# Patient Record
Sex: Female | Born: 1960 | Marital: Single | Smoking: Never smoker
Health system: Southern US, Community
[De-identification: ages and names within clinical notes are randomized; demographics above are authoritative.]

## PROBLEM LIST (undated history)

## (undated) DIAGNOSIS — M810 Age-related osteoporosis without current pathological fracture: Secondary | ICD-10-CM

## (undated) DIAGNOSIS — Z66 Do not resuscitate: Secondary | ICD-10-CM

## (undated) DIAGNOSIS — I10 Essential (primary) hypertension: Secondary | ICD-10-CM

## (undated) DIAGNOSIS — E7800 Pure hypercholesterolemia, unspecified: Secondary | ICD-10-CM

## (undated) DIAGNOSIS — M40204 Unspecified kyphosis, thoracic region: Secondary | ICD-10-CM

## (undated) DIAGNOSIS — F79 Unspecified intellectual disabilities: Secondary | ICD-10-CM

## (undated) HISTORY — DX: Unspecified intellectual disabilities: F79

## (undated) HISTORY — DX: Essential (primary) hypertension: I10

## (undated) HISTORY — DX: Unspecified kyphosis, thoracic region: M40.204

## (undated) HISTORY — DX: Age-related osteoporosis without current pathological fracture: M81.0

## (undated) HISTORY — DX: Do not resuscitate: Z66

## (undated) HISTORY — PX: ABDOMINAL HYSTERECTOMY: SHX81

---

## 2005-07-10 LAB — HM MAMMOGRAPHY

## 2014-09-17 DIAGNOSIS — M8588 Other specified disorders of bone density and structure, other site: Secondary | ICD-10-CM | POA: Diagnosis not present

## 2014-09-17 DIAGNOSIS — E2839 Other primary ovarian failure: Secondary | ICD-10-CM | POA: Diagnosis not present

## 2014-09-17 DIAGNOSIS — M810 Age-related osteoporosis without current pathological fracture: Secondary | ICD-10-CM | POA: Diagnosis not present

## 2014-09-17 LAB — HM DEXA SCAN

## 2014-10-22 DIAGNOSIS — M810 Age-related osteoporosis without current pathological fracture: Secondary | ICD-10-CM | POA: Diagnosis not present

## 2015-06-15 ENCOUNTER — Other Ambulatory Visit: Payer: Self-pay | Admitting: Family Medicine

## 2015-06-15 DIAGNOSIS — Z1231 Encounter for screening mammogram for malignant neoplasm of breast: Secondary | ICD-10-CM

## 2015-06-21 ENCOUNTER — Ambulatory Visit
Admission: RE | Admit: 2015-06-21 | Discharge: 2015-06-21 | Disposition: A | Discharge status: Home / Self Care | Payer: Medicare Other | Source: Ambulatory Visit | Attending: Family Medicine | Admitting: Family Medicine

## 2015-06-21 DIAGNOSIS — Z1231 Encounter for screening mammogram for malignant neoplasm of breast: Secondary | ICD-10-CM | POA: Insufficient documentation

## 2015-08-02 ENCOUNTER — Ambulatory Visit (INDEPENDENT_AMBULATORY_CARE_PROVIDER_SITE_OTHER): Payer: Medicare Other

## 2015-08-02 DIAGNOSIS — Z23 Encounter for immunization: Secondary | ICD-10-CM | POA: Diagnosis not present

## 2015-08-12 DIAGNOSIS — M810 Age-related osteoporosis without current pathological fracture: Secondary | ICD-10-CM | POA: Insufficient documentation

## 2015-08-12 DIAGNOSIS — M40204 Unspecified kyphosis, thoracic region: Secondary | ICD-10-CM | POA: Insufficient documentation

## 2015-08-12 DIAGNOSIS — I10 Essential (primary) hypertension: Secondary | ICD-10-CM | POA: Insufficient documentation

## 2015-08-12 DIAGNOSIS — F79 Unspecified intellectual disabilities: Secondary | ICD-10-CM | POA: Insufficient documentation

## 2015-08-30 ENCOUNTER — Ambulatory Visit (INDEPENDENT_AMBULATORY_CARE_PROVIDER_SITE_OTHER): Payer: Medicare Other | Admitting: Family Medicine

## 2015-08-30 ENCOUNTER — Encounter: Payer: Self-pay | Admitting: Family Medicine

## 2015-08-30 VITALS — BP 140/100 | HR 100 | Temp 97.9°F | Ht 61.0 in | Wt 159.0 lb

## 2015-08-30 DIAGNOSIS — Z1211 Encounter for screening for malignant neoplasm of colon: Secondary | ICD-10-CM | POA: Diagnosis not present

## 2015-08-30 DIAGNOSIS — M810 Age-related osteoporosis without current pathological fracture: Secondary | ICD-10-CM | POA: Diagnosis not present

## 2015-08-30 DIAGNOSIS — I10 Essential (primary) hypertension: Secondary | ICD-10-CM

## 2015-08-30 DIAGNOSIS — Z0000 Encounter for general adult medical examination without abnormal findings: Secondary | ICD-10-CM | POA: Diagnosis not present

## 2015-08-30 MED ORDER — AMLODIPINE BESYLATE 5 MG PO TABS: 5.0000 mg | ORAL_TABLET | Freq: Every day | ORAL | Status: DC

## 2015-08-30 NOTE — Patient Instructions (Addendum)
Please have her get a bone density scan every two years Three servings of calcium OR calcium supplement daily We'll get labs today Give her 1000 iu of vitamin D3 daily Your goal blood pressure is less than 140 mmHg on top. Start amlodipine 5 mg daily Try to follow the DASH guidelines (DASH stands for Dietary Approaches to Stop Hypertension) Try to limit the sodium in your diet.  Ideally, consume less than 1.5 grams (less than 1,'500mg'$ ) per day. Do not add salt when cooking or at the table.  Check the sodium amount on labels when shopping, and choose items lower in sodium when given a choice. Avoid or limit foods that already contain a lot of sodium. Eat a diet rich in fruits and vegetables and whole grains.   Health Maintenance, Female Adopting a healthy lifestyle and getting preventive care can go a long way to promote health and wellness. Talk with your health care provider about what schedule of regular examinations is right for you. This is a good chance for you to check in with your provider about disease prevention and staying healthy. In between checkups, there are plenty of things you can do on your own. Experts have done a lot of research about which lifestyle changes and preventive measures are most likely to keep you healthy. Ask your health care provider for more information. WEIGHT AND DIET  Eat a healthy diet  Be sure to include plenty of vegetables, fruits, low-fat dairy products, and lean protein.  Do not eat a lot of foods high in solid fats, added sugars, or salt.  Get regular exercise. This is one of the most important things you can do for your health.  Most adults should exercise for at least 150 minutes each week. The exercise should increase your heart rate and make you sweat (moderate-intensity exercise).  Most adults should also do strengthening exercises at least twice a week. This is in addition to the moderate-intensity exercise.  Maintain a healthy  weight  Body mass index (BMI) is a measurement that can be used to identify possible weight problems. It estimates body fat based on height and weight. Your health care provider can help determine your BMI and help you achieve or maintain a healthy weight.  For females 25 years of age and older:   A BMI below 18.5 is considered underweight.  A BMI of 18.5 to 24.9 is normal.  A BMI of 25 to 29.9 is considered overweight.  A BMI of 30 and above is considered obese.  Watch levels of cholesterol and blood lipids  You should start having your blood tested for lipids and cholesterol at 54 years of age, then have this test every 5 years.  You may need to have your cholesterol levels checked more often if:  Your lipid or cholesterol levels are high.  You are older than 54 years of age.  You are at high risk for heart disease.  CANCER SCREENING   Lung Cancer  Lung cancer screening is recommended for adults 26-21 years old who are at high risk for lung cancer because of a history of smoking.  A yearly low-dose CT scan of the lungs is recommended for people who:  Currently smoke.  Have quit within the past 15 years.  Have at least a 30-pack-year history of smoking. A pack year is smoking an average of one pack of cigarettes a day for 1 year.  Yearly screening should continue until it has been 15 years since you  quit.  Yearly screening should stop if you develop a health problem that would prevent you from having lung cancer treatment.  Breast Cancer  Practice breast self-awareness. This means understanding how your breasts normally appear and feel.  It also means doing regular breast self-exams. Let your health care provider know about any changes, no matter how small.  If you are in your 20s or 30s, you should have a clinical breast exam (CBE) by a health care provider every 1-3 years as part of a regular health exam.  If you are 38 or older, have a CBE every year. Also  consider having a breast X-ray (mammogram) every year.  If you have a family history of breast cancer, talk to your health care provider about genetic screening.  If you are at high risk for breast cancer, talk to your health care provider about having an MRI and a mammogram every year.  Breast cancer gene (BRCA) assessment is recommended for women who have family members with BRCA-related cancers. BRCA-related cancers include:  Breast.  Ovarian.  Tubal.  Peritoneal cancers.  Results of the assessment will determine the need for genetic counseling and BRCA1 and BRCA2 testing. Cervical Cancer Your health care provider may recommend that you be screened regularly for cancer of the pelvic organs (ovaries, uterus, and vagina). This screening involves a pelvic examination, including checking for microscopic changes to the surface of your cervix (Pap test). You may be encouraged to have this screening done every 3 years, beginning at age 41.  For women ages 67-65, health care providers may recommend pelvic exams and Pap testing every 3 years, or they may recommend the Pap and pelvic exam, combined with testing for human papilloma virus (HPV), every 5 years. Some types of HPV increase your risk of cervical cancer. Testing for HPV may also be done on women of any age with unclear Pap test results.  Other health care providers may not recommend any screening for nonpregnant women who are considered low risk for pelvic cancer and who do not have symptoms. Ask your health care provider if a screening pelvic exam is right for you.  If you have had past treatment for cervical cancer or a condition that could lead to cancer, you need Pap tests and screening for cancer for at least 20 years after your treatment. If Pap tests have been discontinued, your risk factors (such as having a new sexual partner) need to be reassessed to determine if screening should resume. Some women have medical problems that  increase the chance of getting cervical cancer. In these cases, your health care provider may recommend more frequent screening and Pap tests. Colorectal Cancer  This type of cancer can be detected and often prevented.  Routine colorectal cancer screening usually begins at 54 years of age and continues through 54 years of age.  Your health care provider may recommend screening at an earlier age if you have risk factors for colon cancer.  Your health care provider may also recommend using home test kits to check for hidden blood in the stool.  A small camera at the end of a tube can be used to examine your colon directly (sigmoidoscopy or colonoscopy). This is done to check for the earliest forms of colorectal cancer.  Routine screening usually begins at age 70.  Direct examination of the colon should be repeated every 5-10 years through 54 years of age. However, you may need to be screened more often if early forms of precancerous  polyps or small growths are found. Skin Cancer  Check your skin from head to toe regularly.  Tell your health care provider about any new moles or changes in moles, especially if there is a change in a mole's shape or color.  Also tell your health care provider if you have a mole that is larger than the size of a pencil eraser.  Always use sunscreen. Apply sunscreen liberally and repeatedly throughout the day.  Protect yourself by wearing long sleeves, pants, a wide-brimmed hat, and sunglasses whenever you are outside. HEART DISEASE, DIABETES, AND HIGH BLOOD PRESSURE   High blood pressure causes heart disease and increases the risk of stroke. High blood pressure is more likely to develop in:  People who have blood pressure in the high end of the normal range (130-139/85-89 mm Hg).  People who are overweight or obese.  People who are African American.  If you are 74-75 years of age, have your blood pressure checked every 3-5 years. If you are 11 years of  age or older, have your blood pressure checked every year. You should have your blood pressure measured twice--once when you are at a hospital or clinic, and once when you are not at a hospital or clinic. Record the average of the two measurements. To check your blood pressure when you are not at a hospital or clinic, you can use:  An automated blood pressure machine at a pharmacy.  A home blood pressure monitor.  If you are between 73 years and 49 years old, ask your health care provider if you should take aspirin to prevent strokes.  Have regular diabetes screenings. This involves taking a blood sample to check your fasting blood sugar level.  If you are at a normal weight and have a low risk for diabetes, have this test once every three years after 54 years of age.  If you are overweight and have a high risk for diabetes, consider being tested at a younger age or more often. PREVENTING INFECTION  Hepatitis B  If you have a higher risk for hepatitis B, you should be screened for this virus. You are considered at high risk for hepatitis B if:  You were born in a country where hepatitis B is common. Ask your health care provider which countries are considered high risk.  Your parents were born in a high-risk country, and you have not been immunized against hepatitis B (hepatitis B vaccine).  You have HIV or AIDS.  You use needles to inject street drugs.  You live with someone who has hepatitis B.  You have had sex with someone who has hepatitis B.  You get hemodialysis treatment.  You take certain medicines for conditions, including cancer, organ transplantation, and autoimmune conditions. Hepatitis C  Blood testing is recommended for:  Everyone born from [birth year] through 1965.  Anyone with known risk factors for hepatitis C. Sexually transmitted infections (STIs)  You should be screened for sexually transmitted infections (STIs) including gonorrhea and chlamydia if:  You are  sexually active and are younger than 54 years of age.  You are older than 54 years of age and your health care provider tells you that you are at risk for this type of infection.  Your sexual activity has changed since you were last screened and you are at an increased risk for chlamydia or gonorrhea. Ask your health care provider if you are at risk.  If you do not have HIV, but are at risk, it  may be recommended that you take a prescription medicine daily to prevent HIV infection. This is called pre-exposure prophylaxis (PrEP). You are considered at risk if:  You are sexually active and do not regularly use condoms or know the HIV status of your partner(s).  You take drugs by injection.  You are sexually active with a partner who has HIV. Talk with your health care provider about whether you are at high risk of being infected with HIV. If you choose to begin PrEP, you should first be tested for HIV. You should then be tested every 3 months for as long as you are taking PrEP.  PREGNANCY   If you are premenopausal and you may become pregnant, ask your health care provider about preconception counseling.  If you may become pregnant, take 400 to 800 micrograms (mcg) of folic acid every day.  If you want to prevent pregnancy, talk to your health care provider about birth control (contraception). OSTEOPOROSIS AND MENOPAUSE   Osteoporosis is a disease in which the bones lose minerals and strength with aging. This can result in serious bone fractures. Your risk for osteoporosis can be identified using a bone density scan.  If you are 44 years of age or older, or if you are at risk for osteoporosis and fractures, ask your health care provider if you should be screened.  Ask your health care provider whether you should take a calcium or vitamin D supplement to lower your risk for osteoporosis.  Menopause may have certain physical symptoms and risks.  Hormone replacement therapy may reduce some  of these symptoms and risks. Talk to your health care provider about whether hormone replacement therapy is right for you.  HOME CARE INSTRUCTIONS   Schedule regular health, dental, and eye exams.  Stay current with your immunizations.   Do not use any tobacco products including cigarettes, chewing tobacco, or electronic cigarettes.  If you are pregnant, do not drink alcohol.  If you are breastfeeding, limit how much and how often you drink alcohol.  Limit alcohol intake to no more than 1 drink per day for nonpregnant women. One drink equals 12 ounces of beer, 5 ounces of wine, or 1 ounces of hard liquor.  Do not use street drugs.  Do not share needles.  Ask your health care provider for help if you need support or information about quitting drugs.  Tell your health care provider if you often feel depressed.  Tell your health care provider if you have ever been abused or do not feel safe at home.   This information is not intended to replace advice given to you by your health care provider. Make sure you discuss any questions you have with your health care provider.   Document Released: 03/13/2011 Document Revised: 09/18/2014 Document Reviewed: 07/30/2013 Elsevier Interactive Patient Education 2016 [employer] DASH stands for "Dietary Approaches to Stop Hypertension." The DASH eating plan is a healthy eating plan that has been shown to reduce high blood pressure (hypertension). Additional health benefits may include reducing the risk of type 2 diabetes mellitus, heart disease, and stroke. The DASH eating plan may also help with weight loss. WHAT DO I NEED TO KNOW ABOUT THE DASH EATING PLAN? For the DASH eating plan, you will follow these general guidelines:  Choose foods with a percent daily value for sodium of less than 5% (as listed on the food label).  Use salt-free seasonings or herbs instead of table salt or sea salt.  Check with your health care  provider or pharmacist before using salt substitutes.  Eat lower-sodium products, often labeled as "lower sodium" or "no salt added."  Eat fresh foods.  Eat more vegetables, fruits, and low-fat dairy products.  Choose whole grains. Look for the word "whole" as the first word in the ingredient list.  Choose fish and skinless chicken or [place] more often than red meat. Limit fish, poultry, and meat to 6 oz (170 g) each day.  Limit sweets, desserts, sugars, and sugary drinks.  Choose heart-healthy fats.  Limit cheese to 1 oz (28 g) per day.  Eat more home-cooked food and less restaurant, buffet, and fast food.  Limit fried foods.  Cook foods using methods other than frying.  Limit canned vegetables. If you do use them, rinse them well to decrease the sodium.  When eating at a restaurant, ask that your food be prepared with less salt, or no salt if possible. WHAT FOODS CAN I EAT? Seek help from a dietitian for individual calorie needs. Grains Whole grain or whole wheat bread. Brown rice. Whole grain or whole wheat pasta. Quinoa, bulgur, and whole grain cereals. Low-sodium cereals. Corn or whole wheat flour tortillas. Whole grain cornbread. Whole grain crackers. Low-sodium crackers. Vegetables Fresh or frozen vegetables (raw, steamed, roasted, or grilled). Low-sodium or reduced-sodium tomato and vegetable juices. Low-sodium or reduced-sodium tomato sauce and paste. Low-sodium or reduced-sodium canned vegetables.  Fruits All fresh, canned (in natural juice), or frozen fruits. Meat and Other Protein Products Ground beef (85% or leaner), grass-fed beef, or beef trimmed of fat. Skinless chicken or [place]. Ground chicken or [place]. Pork trimmed of fat. All fish and seafood. Eggs. Dried beans, peas, or lentils. Unsalted nuts and seeds. Unsalted canned beans. Dairy Low-fat dairy products, such as skim or 1% milk, 2% or reduced-fat cheeses, low-fat ricotta or cottage cheese, or plain low-fat  yogurt. Low-sodium or reduced-sodium cheeses. Fats and Oils Tub margarines without trans fats. Light or reduced-fat mayonnaise and salad dressings (reduced sodium). Avocado. Safflower, olive, or canola oils. Natural peanut or almond butter. Other Unsalted popcorn and pretzels. The items listed above may not be a complete list of recommended foods or beverages. Contact your dietitian for more options. WHAT FOODS ARE NOT RECOMMENDED? Grains White bread. White pasta. White rice. Refined cornbread. Bagels and croissants. Crackers that contain trans fat. Vegetables Creamed or fried vegetables. Vegetables in a cheese sauce. Regular canned vegetables. Regular canned tomato sauce and paste. Regular tomato and vegetable juices. Fruits Dried fruits. Canned fruit in light or heavy syrup. Fruit juice. Meat and Other Protein Products Fatty cuts of meat. Ribs, chicken wings, bacon, sausage, bologna, salami, chitterlings, fatback, hot dogs, bratwurst, and packaged luncheon meats. Salted nuts and seeds. Canned beans with salt. Dairy Whole or 2% milk, cream, half-and-half, and cream cheese. Whole-fat or sweetened yogurt. Full-fat cheeses or blue cheese. Nondairy creamers and whipped toppings. Processed cheese, cheese spreads, or cheese curds. Condiments Onion and garlic salt, seasoned salt, table salt, and sea salt. Canned and packaged gravies. Worcestershire sauce. Tartar sauce. Barbecue sauce. Teriyaki sauce. Soy sauce, including reduced sodium. Steak sauce. Fish sauce. Oyster sauce. Cocktail sauce. Horseradish. Ketchup and mustard. Meat flavorings and tenderizers. Bouillon cubes. Hot sauce. Tabasco sauce. Marinades. Taco seasonings. Relishes. Fats and Oils Butter, stick margarine, lard, shortening, ghee, and bacon fat. Coconut, palm kernel, or palm oils. Regular salad dressings. Other Pickles and olives. Salted popcorn and pretzels. The items listed above may not be a complete list of foods  and beverages to  avoid. Contact your dietitian for more information. WHERE CAN I FIND MORE INFORMATION? National Heart, Lung, and Blood Institute: travelstabloid.com   This information is not intended to replace advice given to you by your health care provider. Make sure you discuss any questions you have with your health care provider.   Document Released: 08/17/2011 Document Revised: 09/18/2014 Document Reviewed: 07/02/2013 Elsevier Interactive Patient Education [employer].

## 2015-08-30 NOTE — Assessment & Plan Note (Signed)
DEXA every two years; check vit D; three servings of calcium a day, 1000 iu vit D daily

## 2015-08-30 NOTE — Progress Notes (Addendum)
Patient: Tonya Lawson, Female    DOB: 1961/06/20, 54 y.o.   MRN: [id number]  Visit Date: 09/04/2015  Today's Provider: Enid Derry, MD   Chief Complaint  Patient presents with  . Annual Exam    Subjective:   Tonya Lawson is a 54 y.o. female who presents today for her Subsequent Annual Wellness Visit.  Caregiver input: mother here with patient    USPSTF grade A and B recommendations Alcohol: none Depression: no Hypertension: high today, excited mother says Obesity:  Tobacco use: none HIV, hep B, hep C: declined by mother STD testing and prevention (chl/gon/syphilis): declined by mother Lipids: check today Glucose: check tdoay Colorectal cancer: Cologuard Breast cancer: mammo UTD BRCA gene screening: maternal aunt had breast cancer Intimate partner violence: no Cervical cancer screening: s/p hyst Lung cancer: n/a Osteoporosis: n/a Fall prevention/vitamin D: discussed, 800 iu vit D AAA: n/a Aspirin: n/a Diet: healthy eater mom says Exercise: walks a lot, never sits down, hardly ever, rakes leaves, raises a garden Skin cancer: no worrisome moles  HPI  Review of Systems  Constitutional: Negative for fever.  HENT: Positive for rhinorrhea. Negative for hearing loss, mouth sores, nosebleeds and sore throat.   Eyes: Negative for visual disturbance.  Respiratory: Negative for wheezing.   Cardiovascular: Negative for chest pain.  Gastrointestinal: Negative for abdominal pain.  Genitourinary: Negative for dysuria.  Musculoskeletal: Negative for joint swelling.  Skin:       No worrisome skin changes  Allergic/Immunologic: Negative for food allergies.  Neurological: Negative for tremors.  Hematological: Negative for adenopathy.  Psychiatric/Behavioral: Negative for dysphoric mood. The patient is not nervous/anxious.    Past Medical History  Diagnosis Date  . Hypertension   . Osteoporosis   . Mental retardation   . Kyphosis of thoracic region    Past Surgical  History  Procedure Laterality Date  . Abdominal hysterectomy      completed   Family History  Problem Relation Age of Onset  . Breast cancer Maternal Aunt 70  . Diabetes Mother   . Cancer Father     brain  . Cancer Brother   . Stroke Mother    Social History   Social History  . Marital Status: Unknown    Spouse Name: N/A  . Number of Children: N/A  . Years of Education: N/A   Occupational History  . Not on file.   Social History Main Topics  . Smoking status: Never Smoker   . Smokeless tobacco: Never Used  . Alcohol Use: No  . Drug Use: No  . Sexual Activity: Not on file   Other Topics Concern  . Not on file   Social History Narrative    Outpatient Encounter Prescriptions as of 08/30/2015  Medication Sig  . alendronate (FOSAMAX) 70 MG tablet Take 70 mg by mouth once a week. Take with a full glass of water on an empty stomach.  . [DISCONTINUED] amLODipine (NORVASC) 5 MG tablet Take 1 tablet (5 mg total) by mouth daily.   No facility-administered encounter medications on file as of 08/30/2015.   Functional Ability / Safety Screening 1.  Was the timed Get Up and Go test longer than 30 seconds?  no 2.  Does the patient need help with the phone, transportation, shopping,      preparing meals, housework, laundry, medications, or managing money?  yes , can make a sandwich; needs help with everything else 3.  Does the patient's home have:  loose throw rugs in the  hallway?   no      Grab bars in the bathroom? yes      Handrails on the stairs?   yes      Poor lighting?   no 4.  Has the patient noticed any hearing difficulties?   no  Fall Risk Assessment See under rooming  Depression Screen See under rooming Depression screen [facility] 2/9 08/30/2015  Decreased Interest 0  Down, Depressed, Hopeless 0  PHQ - 2 Score 0   Advanced Directives Does patient have a HCPOA?    yes If yes, name and contact information: Albina Billet [phone] Mother is going to have  it lined up that if anything happens to mother or father, patient can go to  [place] Does patient have a living will or MOST form?  no  Objective:   Vitals: BP 140/100 mmHg  Pulse 100  Temp(Src) 97.9 F (36.6 C)  Ht '5\' 1"'$  (1.549 m)  Wt 159 lb (72.122 kg)  BMI 30.06 kg/m2  SpO2 100% Body mass index is 30.06 kg/(m^2). No exam data present BP up because she is excited about brining candy Today's Vitals   08/30/15 0806 08/30/15 0928  BP: 149/94 140/100  Pulse: 100   Temp: 97.9 F (36.6 C)   Height: '5\' 1"'$  (1.549 m)   Weight: 159 lb (72.122 kg)   SpO2: 100%   recheck BP 140/100  Physical Exam  Constitutional: She appears well-developed and well-nourished.  HENT:  Head: Normocephalic and atraumatic.  Right Ear: Hearing, tympanic membrane, external ear and ear canal normal.  Left Ear: Hearing, tympanic membrane, external ear and ear canal normal.  Eyes: Conjunctivae and EOM are normal. Right eye exhibits no hordeolum. Left eye exhibits no hordeolum. No scleral icterus.  Neck: Carotid bruit is not present. No thyromegaly present.  Cardiovascular: Normal rate, regular rhythm, S1 normal, S2 normal and normal heart sounds.   No extrasystoles are present.  Pulmonary/Chest: Effort normal and breath sounds normal. No respiratory distress.  Mother declined breast exam for patient  Abdominal: Soft. Normal appearance and bowel sounds are normal. She exhibits no distension, no abdominal bruit, no pulsatile midline mass and no mass. There is no hepatosplenomegaly. There is no tenderness. No hernia.  Musculoskeletal: Normal range of motion. She exhibits no edema.  Lymphadenopathy:       Head (right side): No submandibular adenopathy present.       Head (left side): No submandibular adenopathy present.    She has no cervical adenopathy.    She has no axillary adenopathy.  Neurological: She is alert. She displays no tremor. No cranial nerve deficit. She exhibits normal muscle tone. Gait  normal.  Reflex Scores:      Patellar reflexes are 2+ on the right side and 2+ on the left side. Skin: Skin is warm and dry. No bruising and no ecchymosis noted. No cyanosis. No pallor.  Psychiatric: Her speech is normal and behavior is normal. Thought content normal. Her mood appears not anxious. She does not exhibit a depressed mood.   Mood/affect:  Euthymic, pleasant Appearance:  Casually dressed in [place] t-shirt  Cognitive Testing - 6-CIT  Correct? Score   What year is it? no 4 Yes = 0    No = 4  What month is it? no 3 Yes = 0    No = 3  Remember:     [place], [address] [place], [place]     What time is it? no 3 Yes =  0    No = 3  Count backwards from 20 to 1 no 4 Correct = 0    1 error = 2   More than 1 error = 4  Say the months of the year in reverse. no 4 Correct = 0    1 error = 2   More than 1 error = 4  What address did I ask you to remember? no 10 Correct = 0  1 error = 2    2 error = 4    3 error = 6    4 error = 8    All wrong = 10       TOTAL SCORE  28/28   Interpretation:  Abnormal- limited mentation  Normal (0-7) Abnormal (8-28)    Assessment & Plan:     Annual Wellness Visit  Reviewed patient's Family Medical History Reviewed and updated list of patient's medical providers Assessment of cognitive impairment was done Assessed patient's functional ability Established a written schedule for health screening [place] Completed and Reviewed  Immunization History  Administered Date(s) Administered  . Influenza,inj,Quad PF,36+ Mos 08/02/2015  . Pneumococcal Polysaccharide-23 06/23/2000, 07/24/2005  . Td 06/20/2005  . Zoster 09/17/2013    Health Maintenance  Topic Date Due  . COLONOSCOPY  08/29/2016 (Originally 06/09/2011)  . TETANUS/TDAP  08/29/2016 (Originally 06/21/2015)  . Hepatitis C Screening  08/29/2016 (Originally [birth year]/1960)  . HIV Screening  08/29/2016 (Originally 06/08/1976)  . PAP SMEAR  08/29/2018 (Originally 06/08/1982)  .  INFLUENZA VACCINE  04/11/2016  . MAMMOGRAM  06/20/2017   Discussed health benefits of physical activity, and encouraged her to engage in regular exercise appropriate for her age and condition.    Current outpatient prescriptions:  .  alendronate (FOSAMAX) 70 MG tablet, Take 70 mg by mouth once a week. Take with a full glass of water on an empty stomach., Disp: , Rfl:  There are no discontinued medications.  Next Medicare Wellness Visit in 12+ months  Problem List Items Addressed This Visit      Cardiovascular and Mediastinum   Hypertension    BP elevated today, even on recheck; will start low-dose antihypertensive; mother will check at home; DASH guidelines        Musculoskeletal and Integument   Osteoporosis    DEXA every two years; check vit D; three servings of calcium a day, 1000 iu vit D daily      Relevant Orders   VITAMIN D 25 Hydroxy (Vit-D Deficiency, Fractures) (Completed)   DG Bone Density     Other   Preventative health care - Primary    USPSTF grade A and B recommendations reviewed with patient; age-appropriate recommendations, preventive care, screening tests, etc discussed and encouraged; healthy living encouraged; see AVS for patient education given to patient      Relevant Orders   CBC with Differential/Platelet (Completed)   Comprehensive metabolic panel (Completed)   Lipid Panel w/o Chol/HDL Ratio (Completed)    Other Visit Diagnoses    Colon cancer screening        Relevant Orders    Cologuard      An after-visit summary was printed and given to the patient at [place].  Please see the patient instructions which may contain other information and recommendations beyond what is mentioned above in the assessment and plan.  Orders Placed This Encounter  Procedures  . DG Bone Density  . CBC with Differential/Platelet  . Comprehensive metabolic panel  . Lipid Panel w/o  Chol/HDL Ratio  . VITAMIN D 25 Hydroxy (Vit-D Deficiency, Fractures)  .  Cologuard

## 2015-08-31 ENCOUNTER — Telehealth: Payer: Self-pay | Admitting: Family Medicine

## 2015-08-31 LAB — CBC WITH DIFFERENTIAL/PLATELET
BASOS ABS: 0.1 10*3/uL (ref 0.0–0.2)
Basos: 1 %
EOS (ABSOLUTE): 0.3 10*3/uL (ref 0.0–0.4)
Eos: 4 %
Hematocrit: 41.9 % (ref 34.0–46.6)
Hemoglobin: 13.9 g/dL (ref 11.1–15.9)
IMMATURE GRANS (ABS): 0 10*3/uL (ref 0.0–0.1)
IMMATURE GRANULOCYTES: 0 %
LYMPHS: 38 %
Lymphocytes Absolute: 3.3 10*3/uL — ABNORMAL HIGH (ref 0.7–3.1)
MCH: 28.5 pg (ref 26.6–33.0)
MCHC: 33.2 g/dL (ref 31.5–35.7)
MCV: 86 fL (ref 79–97)
MONOCYTES: 7 %
Monocytes Absolute: 0.6 10*3/uL (ref 0.1–0.9)
NEUTROS PCT: 50 %
Neutrophils Absolute: 4.3 10*3/uL (ref 1.4–7.0)
PLATELETS: 389 10*3/uL — AB (ref 150–379)
RBC: 4.88 x10E6/uL (ref 3.77–5.28)
RDW: 14.5 % (ref 12.3–15.4)
WBC: 8.7 10*3/uL (ref 3.4–10.8)

## 2015-08-31 LAB — COMPREHENSIVE METABOLIC PANEL
ALT: 9 IU/L (ref 0–32)
AST: 11 IU/L (ref 0–40)
Albumin/Globulin Ratio: 1.7 (ref 1.1–2.5)
Albumin: 4 g/dL (ref 3.5–5.5)
Alkaline Phosphatase: 100 IU/L (ref 39–117)
BUN/Creatinine Ratio: 18 (ref 9–23)
BUN: 14 mg/dL (ref 6–24)
CALCIUM: 9.2 mg/dL (ref 8.7–10.2)
CHLORIDE: 104 mmol/L (ref 96–106)
CO2: 24 mmol/L (ref 18–29)
CREATININE: 0.76 mg/dL (ref 0.57–1.00)
GFR, EST AFRICAN AMERICAN: 103 mL/min/{1.73_m2} (ref >59)
GFR, EST NON AFRICAN AMERICAN: 89 mL/min/{1.73_m2} (ref >59)
GLUCOSE: 99 mg/dL (ref 65–99)
Globulin, Total: 2.3 g/dL (ref 1.5–4.5)
Potassium: 4.4 mmol/L (ref 3.5–5.2)
Sodium: 142 mmol/L (ref 134–144)
TOTAL PROTEIN: 6.3 g/dL (ref 6.0–8.5)

## 2015-08-31 LAB — LIPID PANEL W/O CHOL/HDL RATIO
Cholesterol, Total: 210 mg/dL — ABNORMAL HIGH (ref 100–199)
HDL: 51 mg/dL (ref >39)
LDL Calculated: 134 mg/dL — ABNORMAL HIGH (ref 0–99)
Triglycerides: 127 mg/dL (ref 0–149)
VLDL CHOLESTEROL CAL: 25 mg/dL (ref 5–40)

## 2015-08-31 LAB — VITAMIN D 25 HYDROXY (VIT D DEFICIENCY, FRACTURES): Vit D, 25-Hydroxy: 33 ng/mL (ref 30.0–100.0)

## 2015-08-31 NOTE — Telephone Encounter (Signed)
pts mom called stated that the pts bp was down when she got home so she wasn't sure if she needed to give her the bp medication.

## 2015-08-31 NOTE — Telephone Encounter (Signed)
BP was 110/60 when she was at home She will check her BP daily and let us know if trending up; I explained lab results

## 2015-09-04 NOTE — Addendum Note (Signed)
Addended by: LADA, Satira Anis on: 09/04/2015 01:01 PM   Modules accepted: Orders, SmartSet

## 2015-09-04 NOTE — Assessment & Plan Note (Signed)
USPSTF grade A and B recommendations reviewed with patient; age-appropriate recommendations, preventive care, screening tests, etc discussed and encouraged; healthy living encouraged; see AVS for patient education given to patient  

## 2015-09-04 NOTE — Assessment & Plan Note (Signed)
BP elevated today, even on recheck; will start low-dose antihypertensive; mother will check at home; DASH guidelines

## 2015-09-28 ENCOUNTER — Ambulatory Visit: Payer: Medicare Other | Admitting: Family Medicine

## 2015-10-07 ENCOUNTER — Encounter: Payer: Self-pay | Admitting: Family Medicine

## 2015-10-14 ENCOUNTER — Telehealth: Payer: Self-pay | Admitting: Family Medicine

## 2015-10-14 ENCOUNTER — Ambulatory Visit
Admission: RE | Admit: 2015-10-14 | Discharge: 2015-10-14 | Disposition: A | Discharge status: Home / Self Care | Payer: Medicare Other | Source: Ambulatory Visit | Attending: Family Medicine | Admitting: Family Medicine

## 2015-10-14 DIAGNOSIS — Z13820 Encounter for screening for osteoporosis: Secondary | ICD-10-CM | POA: Diagnosis not present

## 2015-10-14 DIAGNOSIS — M810 Age-related osteoporosis without current pathological fracture: Secondary | ICD-10-CM | POA: Diagnosis not present

## 2015-10-14 NOTE — Telephone Encounter (Signed)
Osteoporosis

## 2015-10-15 MED ORDER — ALENDRONATE SODIUM 70 MG PO TABS: 70.0000 mg | ORAL_TABLET | ORAL | Status: DC

## 2015-10-15 NOTE — Telephone Encounter (Signed)
I talked with mother about DEXA; osteoporosis; fall precautions discussed; calcium, vit D Continue bisphosphonate rescan in two years

## 2015-11-05 ENCOUNTER — Ambulatory Visit: Payer: Medicare Other | Admitting: Family Medicine

## 2015-12-23 ENCOUNTER — Encounter: Payer: Self-pay | Admitting: Family Medicine

## 2015-12-23 ENCOUNTER — Ambulatory Visit (INDEPENDENT_AMBULATORY_CARE_PROVIDER_SITE_OTHER): Payer: Medicare Other | Admitting: Family Medicine

## 2015-12-23 ENCOUNTER — Ambulatory Visit: Payer: Medicare Other | Admitting: Family Medicine

## 2015-12-23 VITALS — BP 122/84 | HR 114 | Temp 98.8°F | Resp 16 | Ht 61.0 in | Wt 159.4 lb

## 2015-12-23 DIAGNOSIS — F79 Unspecified intellectual disabilities: Secondary | ICD-10-CM

## 2015-12-23 DIAGNOSIS — J40 Bronchitis, not specified as acute or chronic: Secondary | ICD-10-CM | POA: Diagnosis not present

## 2015-12-23 MED ORDER — AZITHROMYCIN 250 MG PO TABS: ORAL_TABLET | ORAL | Status: AC

## 2015-12-23 NOTE — Patient Instructions (Signed)
Start the new antibiotics Please do eat yogurt daily or take a probiotic daily for the next month or two We want to replace the healthy germs in the gut If you notice foul, watery diarrhea in the next two months, schedule an appointment RIGHT AWAY Try vitamin C (orange juice if not diabetic or vitamin C tablets) and drink green tea to help your immune system during your illness Get plenty of rest and hydration Call if needed

## 2015-12-23 NOTE — Progress Notes (Signed)
BP 122/84 mmHg  Pulse 114  Temp(Src) 98.8 F (37.1 C) (Oral)  Resp 16  Ht 5\' 1"  (1.549 m)  Wt 159 lb 6.4 oz (72.303 kg)  BMI 30.13 kg/m2  SpO2 97%   Subjective:    Patient ID: Tonya Lawson, female    DOB: [birth year], 55 y.o.   MRN: [id number]  HPI: Tonya Lawson is a 55 y.o. female  Chief Complaint  Patient presents with  . URI    Onset 1 week. Symptoms include sneezing, runny nose, cough   Patient is here unaccompanied; father in waiting room; he says she got sick 2 days ago; everyone else at home is sick with cough and daddy has bronchitis; he is on a Zpak right now; sister got sick last night; mother is sick too and coldn't come today; nose is running; ears are bothering her; throat is sore; no rash; no travel; no medicine; cough is keeping her up at night; no pressure in the sinuses; ears are okay; no body aches; father says this is not her heart; he says she has a chest cold  Depression screen [facility] 2/9 12/23/2015 08/30/2015  Decreased Interest 0 0  Down, Depressed, Hopeless 0 0  PHQ - 2 Score 0 0   Relevant past medical hx reviewed Past Medical History  Diagnosis Date  . Hypertension   . Osteoporosis   . Mental retardation   . Kyphosis of thoracic region    Interim medical history since our last visit reviewed. Allergies and medications reviewed  Review of Systems Per HPI unless specifically indicated above     Objective:    BP 122/84 mmHg  Pulse 114  Temp(Src) 98.8 F (37.1 C) (Oral)  Resp 16  Ht 5\' 1"  (1.549 m)  Wt 159 lb 6.4 oz (72.303 kg)  BMI 30.13 kg/m2  SpO2 97%  Wt Readings from Last 3 Encounters:  12/23/15 159 lb 6.4 oz (72.303 kg)  08/30/15 159 lb (72.122 kg)  10/22/14 159 lb (72.122 kg)    Physical Exam  Constitutional: She appears well-developed and well-nourished. No distress.  HENT:  Mouth/Throat: Mucous membranes are normal.  Eyes: EOM are normal. Right eye exhibits no discharge. Left eye exhibits no discharge. No scleral  icterus.  Cardiovascular: Regular rhythm.  Tachycardia present.   Pulmonary/Chest: Effort normal and breath sounds normal. No accessory muscle usage. No respiratory distress. She has no decreased breath sounds. She has no wheezes. She has no rhonchi.  Psychiatric: She has a normal mood and affect. Her behavior is normal. Cognition and memory are impaired.  Poor historian, cooperative      Assessment & Plan:   Problem List Items Addressed This Visit      Other   Mental retardation    Patient is poor historian; mother who usually accompanies patient is not here; father remained in waiting room; will treat on the side of caution with antibiotics since history is limited       Other Visit Diagnoses    Bronchitis    -  Primary    limited history; tachycardic, treat with antibiotics, instructions to father to take to ER or urgent care if getting worse; rest, hydration, see AVS       Follow up plan: No Follow-up on file.  An after-visit summary was printed and given to the patient at [place].  Please see the patient instructions which may contain other information and recommendations beyond what is mentioned above in the assessment and plan.  Meds  ordered this encounter  Medications  . azithromycin (ZITHROMAX) 250 MG tablet    Sig: Two pills by mouth today and then one pill daily for four more days    Dispense:  6 tablet    Refill:  0

## 2016-01-14 NOTE — Assessment & Plan Note (Signed)
Patient is poor historian; mother who usually accompanies patient is not here; father remained in waiting room; will treat on the side of caution with antibiotics since history is limited

## 2016-04-07 ENCOUNTER — Encounter: Payer: Self-pay | Admitting: Family Medicine

## 2016-04-07 ENCOUNTER — Ambulatory Visit (INDEPENDENT_AMBULATORY_CARE_PROVIDER_SITE_OTHER): Payer: Medicare Other | Admitting: Family Medicine

## 2016-04-07 VITALS — BP 114/78 | HR 93 | Temp 97.3°F | Resp 14 | Wt 159.0 lb

## 2016-04-07 DIAGNOSIS — F79 Unspecified intellectual disabilities: Secondary | ICD-10-CM

## 2016-04-07 DIAGNOSIS — M810 Age-related osteoporosis without current pathological fracture: Secondary | ICD-10-CM | POA: Diagnosis not present

## 2016-04-07 DIAGNOSIS — D473 Essential (hemorrhagic) thrombocythemia: Secondary | ICD-10-CM

## 2016-04-07 DIAGNOSIS — E7800 Pure hypercholesterolemia, unspecified: Secondary | ICD-10-CM | POA: Diagnosis not present

## 2016-04-07 DIAGNOSIS — D75839 Thrombocytosis, unspecified: Secondary | ICD-10-CM

## 2016-04-07 DIAGNOSIS — Z5181 Encounter for therapeutic drug level monitoring: Secondary | ICD-10-CM

## 2016-04-07 LAB — CBC WITH DIFFERENTIAL/PLATELET
BASOS PCT: 1 %
Basophils Absolute: 84 cells/uL (ref 0–200)
EOS PCT: 4 %
Eosinophils Absolute: 336 cells/uL (ref 15–500)
HCT: 44.1 % (ref 35.0–45.0)
HEMOGLOBIN: 14.4 g/dL (ref 11.7–15.5)
LYMPHS ABS: 3780 {cells}/uL (ref 850–3900)
Lymphocytes Relative: 45 %
MCH: 28.9 pg (ref 27.0–33.0)
MCHC: 32.7 g/dL (ref 32.0–36.0)
MCV: 88.6 fL (ref 80.0–100.0)
MPV: 9.5 fL (ref 7.5–12.5)
Monocytes Absolute: 672 cells/uL (ref 200–950)
Monocytes Relative: 8 %
NEUTROS ABS: 3528 {cells}/uL (ref 1500–7800)
NEUTROS PCT: 42 %
Platelets: 316 10*3/uL (ref 140–400)
RBC: 4.98 MIL/uL (ref 3.80–5.10)
RDW: 14.6 % (ref 11.0–15.0)
WBC: 8.4 10*3/uL (ref 3.8–10.8)

## 2016-04-07 LAB — LIPID PANEL
Cholesterol: 272 mg/dL — ABNORMAL HIGH (ref 125–200)
HDL: 61 mg/dL (ref >=46)
LDL Cholesterol: 178 mg/dL — ABNORMAL HIGH (ref <130)
Total CHOL/HDL Ratio: 4.5 Ratio (ref <=5.0)
Triglycerides: 164 mg/dL — ABNORMAL HIGH (ref <150)
VLDL: 33 mg/dL — AB (ref <30)

## 2016-04-07 LAB — BASIC METABOLIC PANEL WITH GFR
BUN: 15 mg/dL (ref 7–25)
CALCIUM: 9.3 mg/dL (ref 8.6–10.4)
CO2: 25 mmol/L (ref 20–31)
Chloride: 106 mmol/L (ref 98–110)
Creat: 0.93 mg/dL (ref 0.50–1.05)
GFR, EST NON AFRICAN AMERICAN: 70 mL/min (ref >=60)
GFR, Est African American: 81 mL/min (ref >=60)
Glucose, Bld: 97 mg/dL (ref 65–99)
Potassium: 4 mmol/L (ref 3.5–5.3)
SODIUM: 142 mmol/L (ref 135–146)

## 2016-04-07 MED ORDER — ALENDRONATE SODIUM 70 MG PO TABS: 70.0000 mg | ORAL_TABLET | ORAL | 11 refills | Status: DC

## 2016-04-07 NOTE — Assessment & Plan Note (Signed)
Start on the bisphosphonate; get calcium daily, 3 servings; avoid falls; walk and get exercise

## 2016-04-07 NOTE — Progress Notes (Signed)
BP 114/78   Pulse 93   Temp 97.3 F (36.3 C) (Oral)   Resp 14   Wt 159 lb (72.1 kg)   SpO2 97%   BMI 30.04 kg/m    Subjective:    Patient ID: Tonya Lawson, female    DOB: 1961/08/19, 55 y.o.   MRN: [id number]  HPI: Tonya Lawson is a 55 y.o. female  Chief Complaint  Patient presents with  . Follow-up    HTN; well-controlled; not checking at home; no added salt  Osteoporosis; has not started medicine yet, just a glitch; getting calcium daily; loves broccoli; not much milk Vit D in December 2016 was normal at 33  High cholesterol, very mild; total 210 and LDL 134; HDL 51; does eat bacon and sausage and cheese  Last CMP reviewed; creatinine 0.76; urinating fine  Platelet slightly elevated at 389; no joint pain  Depression screen [facility] 2/9 04/07/2016 12/23/2015 08/30/2015  Decreased Interest 0 0 0  Down, Depressed, Hopeless 0 0 0  PHQ - 2 Score 0 0 0    No flowsheet data found.  Relevant past medical, surgical, family and social history reviewed Past Medical History:  Diagnosis Date  . Hypertension   . Kyphosis of thoracic region   . Mental retardation   . Osteoporosis    Past Surgical History:  Procedure Laterality Date  . ABDOMINAL HYSTERECTOMY     completed   Family History  Problem Relation Age of Onset  . Breast cancer Maternal Aunt 70  . Diabetes Mother   . Cancer Father     brain  . Cancer Brother   . Stroke Mother    Social History  Substance Use Topics  . Smoking status: Never Smoker  . Smokeless tobacco: Never Used  . Alcohol use No    Interim medical history since last visit reviewed. Allergies and medications reviewed  Review of Systems Per HPI unless specifically indicated above     Objective:    BP 114/78   Pulse 93   Temp 97.3 F (36.3 C) (Oral)   Resp 14   Wt 159 lb (72.1 kg)   SpO2 97%   BMI 30.04 kg/m   Wt Readings from Last 3 Encounters:  04/07/16 159 lb (72.1 kg)  12/23/15 159 lb 6.4 oz (72.3 kg)  08/30/15  159 lb (72.1 kg)    Physical Exam  Constitutional: She appears well-developed and well-nourished.  HENT:  Head: Normocephalic and atraumatic.  Right Ear: Hearing normal.  Left Ear: Hearing normal.  Mouth/Throat: Mucous membranes are normal.  Eyes: Conjunctivae and EOM are normal. Right eye exhibits no hordeolum. Left eye exhibits no hordeolum. No scleral icterus.  Neck: Carotid bruit is not present. No thyromegaly present.  Cardiovascular: Normal rate, regular rhythm, S1 normal, S2 normal and normal heart sounds.   No extrasystoles are present.  Pulmonary/Chest: Effort normal and breath sounds normal. No respiratory distress.  Abdominal: Soft. Normal appearance and bowel sounds are normal. She exhibits no distension. There is no tenderness.  Musculoskeletal: Normal range of motion. She exhibits no edema.  Neurological: She is alert. She displays no tremor. Gait normal.  Skin: Skin is warm and dry. No bruising and no ecchymosis noted. No cyanosis. No pallor.  Psychiatric: Her speech is normal and behavior is normal. Thought content normal. Her mood appears not anxious. She does not exhibit a depressed mood.   Results for orders placed or performed in visit on 08/30/15  CBC with Differential/Platelet  Result  Value Ref Range   WBC 8.7 3.4 - 10.8 x10E3/uL   RBC 4.88 3.77 - 5.28 x10E6/uL   Hemoglobin 13.9 11.1 - 15.9 g/dL   Hematocrit 41.9 34.0 - 46.6 %   MCV 86 79 - 97 fL   MCH 28.5 26.6 - 33.0 pg   MCHC 33.2 31.5 - 35.7 g/dL   RDW 14.5 12.3 - 15.4 %   Platelets 389 (H) 150 - 379 x10E3/uL   Neutrophils 50 %   Lymphs 38 %   Monocytes 7 %   Eos 4 %   Basos 1 %   Neutrophils Absolute 4.3 1.4 - 7.0 x10E3/uL   Lymphocytes Absolute 3.3 (H) 0.7 - 3.1 x10E3/uL   Monocytes Absolute 0.6 0.1 - 0.9 x10E3/uL   EOS (ABSOLUTE) 0.3 0.0 - 0.4 x10E3/uL   Basophils Absolute 0.1 0.0 - 0.2 x10E3/uL   Immature Granulocytes 0 %   Immature Grans (Abs) 0.0 0.0 - 0.1 x10E3/uL  Comprehensive metabolic  panel  Result Value Ref Range   Glucose 99 65 - 99 mg/dL   BUN 14 6 - 24 mg/dL   Creatinine, Ser 0.76 0.57 - 1.00 mg/dL   GFR calc non Af Amer 89 >59 mL/min/1.73   GFR calc Af Amer 103 >59 mL/min/1.73   BUN/Creatinine Ratio 18 9 - 23   Sodium 142 134 - 144 mmol/L   Potassium 4.4 3.5 - 5.2 mmol/L   Chloride 104 96 - 106 mmol/L   CO2 24 18 - 29 mmol/L   Calcium 9.2 8.7 - 10.2 mg/dL   Total Protein 6.3 6.0 - 8.5 g/dL   Albumin 4.0 3.5 - 5.5 g/dL   Globulin, Total 2.3 1.5 - 4.5 g/dL   Albumin/Globulin Ratio 1.7 1.1 - 2.5   Bilirubin Total <0.2 0.0 - 1.2 mg/dL   Alkaline Phosphatase 100 39 - 117 IU/L   AST 11 0 - 40 IU/L   ALT 9 0 - 32 IU/L  Lipid Panel w/o Chol/HDL Ratio  Result Value Ref Range   Cholesterol, Total 210 (H) 100 - 199 mg/dL   Triglycerides 127 0 - 149 mg/dL   HDL 51 >39 mg/dL   VLDL Cholesterol Cal 25 5 - 40 mg/dL   LDL Calculated 134 (H) 0 - 99 mg/dL  VITAMIN D 25 Hydroxy (Vit-D Deficiency, Fractures)  Result Value Ref Range   Vit D, 25-Hydroxy 33.0 30.0 - 100.0 ng/mL      Assessment & Plan:   Problem List Items Addressed This Visit      Musculoskeletal and Integument   Osteoporosis - Primary    Start on the bisphosphonate; get calcium daily, 3 servings; avoid falls; walk and get exercise      Relevant Medications   alendronate (FOSAMAX) 70 MG tablet     Hematopoietic and Hemostatic   Thrombocytosis (HCC)    Check CBC      Relevant Orders   CBC with Differential/Platelet     Other   Mental retardation    Around the clock supervision; mother trying to get them lined up for [facility] if something happens to parents      Medication monitoring encounter    Check creatinine and calcium      Relevant Orders   BASIC METABOLIC PANEL WITH GFR   Elevated cholesterol    Limit saturated fats      Relevant Orders   Lipid panel    Other Visit Diagnoses   None.     Follow up plan: Return in about  6 months (around 10/08/2016) for fasting labs  and visit.  An after-visit summary was printed and given to the patient at [place].  Please see the patient instructions which may contain other information and recommendations beyond what is mentioned above in the assessment and plan.  Meds ordered this encounter  Medications  . alendronate (FOSAMAX) 70 MG tablet    Sig: Take 1 tablet (70 mg total) by mouth once a week. Take with a full glass of water on an empty stomach.    Dispense:  4 tablet    Refill:  11    Orders Placed This Encounter  Procedures  . CBC with Differential/Platelet  . BASIC METABOLIC PANEL WITH GFR  . Lipid panel

## 2016-04-07 NOTE — Assessment & Plan Note (Signed)
Limit saturated fats 

## 2016-04-07 NOTE — Assessment & Plan Note (Signed)
Check CBC 

## 2016-04-07 NOTE — Patient Instructions (Addendum)
Try chocolate almond or chocolate soy milk as a treat Ice cream can be a treat, but not a good daily source of calcium Get plenty of greens Start the bisphosphonate Try to limit saturated fats in your diet (bologna, hot dogs, barbeque, cheeseburgers, hamburgers, steak, bacon, sausage, cheese, etc.) and get more fresh fruits, vegetables, and whole grains   Osteoporosis Osteoporosis is the thinning and loss of density in the bones. Osteoporosis makes the bones more brittle, fragile, and likely to break (fracture). Over time, osteoporosis can cause the bones to become so weak that they fracture after a simple fall. The bones most likely to fracture are the bones in the hip, wrist, and spine. CAUSES  The exact cause is not known. RISK FACTORS Anyone can develop osteoporosis. You may be at greater risk if you have a family history of the condition or have poor nutrition. You may also have a higher risk if you are:   Female.   55 years old or older.  A smoker.  Not physically active.   White or Asian.  Slender. SIGNS AND SYMPTOMS  A fracture might be the first sign of the disease, especially if it results from a fall or injury that would not usually cause a bone to break. Other signs and symptoms include:   Low back and neck pain.  Stooped posture.  Height loss. DIAGNOSIS  To make a diagnosis, your health care provider may:  Take a medical history.  Perform a physical exam.  Order tests, such as:  A bone mineral density test.  A dual-energy X-ray absorptiometry test. TREATMENT  The goal of osteoporosis treatment is to strengthen your bones to reduce your risk of a fracture. Treatment may involve:  Making lifestyle changes, such as:  Eating a diet rich in calcium.  Doing weight-bearing and muscle-strengthening exercises.  Stopping tobacco use.  Limiting alcohol intake.  Taking medicine to slow the process of bone loss or to increase bone density.  Monitoring  your levels of calcium and vitamin D. HOME CARE INSTRUCTIONS  Include calcium and vitamin D in your diet. Calcium is important for bone health, and vitamin D helps the body absorb calcium.  Perform weight-bearing and muscle-strengthening exercises as directed by your health care provider.  Do not use any tobacco products, including cigarettes, chewing tobacco, and electronic cigarettes. If you need help quitting, ask your health care provider.  Limit your alcohol intake.  Take medicines only as directed by your health care provider.  Keep all follow-up visits as directed by your health care provider. This is important.  Take precautions at home to lower your risk of falling, such as:  Keeping rooms well lit and clutter free.  Installing safety rails on stairs.  Using rubber mats in the bathroom and other areas that are often wet or slippery. SEEK IMMEDIATE MEDICAL CARE IF:  You fall or injure yourself.    This information is not intended to replace advice given to you by your health care provider. Make sure you discuss any questions you have with your health care provider.   Document Released: 06/07/2005 Document Revised: 09/18/2014 Document Reviewed: 02/05/2014 Elsevier Interactive Patient Education [employer].

## 2016-04-07 NOTE — Assessment & Plan Note (Signed)
Check creatinine and calcium

## 2016-04-07 NOTE — Assessment & Plan Note (Addendum)
Around the clock supervision; mother trying to get them lined up for [facility] if something happens to parents

## 2016-04-08 ENCOUNTER — Other Ambulatory Visit: Payer: Self-pay | Admitting: Family Medicine

## 2016-04-08 DIAGNOSIS — E7800 Pure hypercholesterolemia, unspecified: Secondary | ICD-10-CM

## 2016-04-08 DIAGNOSIS — Z5181 Encounter for therapeutic drug level monitoring: Secondary | ICD-10-CM

## 2016-04-08 MED ORDER — ATORVASTATIN CALCIUM 20 MG PO TABS: 20.0000 mg | ORAL_TABLET | Freq: Every day | ORAL | 1 refills | Status: DC

## 2016-04-08 NOTE — Assessment & Plan Note (Signed)
Start statin; recheck sgpt in 6-8 weeks

## 2016-04-08 NOTE — Assessment & Plan Note (Signed)
Start statin, recheck lipids in 6-8 weeks 

## 2016-06-15 ENCOUNTER — Telehealth: Payer: Self-pay

## 2016-06-15 NOTE — Telephone Encounter (Signed)
Please have them STOP the atorvastatin Please add to adverse reactions Let medicine wash out and then call us in 4 weeks with an update appt sooner if not getting better

## 2016-06-15 NOTE — Telephone Encounter (Signed)
Mother notified

## 2016-06-15 NOTE — Telephone Encounter (Signed)
Mother called about her also on the atorvastatin having the same symptoms of memory loss and falling.  Please advise

## 2016-07-12 ENCOUNTER — Ambulatory Visit (INDEPENDENT_AMBULATORY_CARE_PROVIDER_SITE_OTHER): Payer: Medicare Other

## 2016-07-12 DIAGNOSIS — Z23 Encounter for immunization: Secondary | ICD-10-CM

## 2016-07-25 DIAGNOSIS — B9789 Other viral agents as the cause of diseases classified elsewhere: Secondary | ICD-10-CM | POA: Diagnosis not present

## 2016-07-25 DIAGNOSIS — J069 Acute upper respiratory infection, unspecified: Secondary | ICD-10-CM | POA: Diagnosis not present

## 2016-10-09 ENCOUNTER — Ambulatory Visit (INDEPENDENT_AMBULATORY_CARE_PROVIDER_SITE_OTHER): Payer: Medicare Other | Admitting: Family Medicine

## 2016-10-09 ENCOUNTER — Encounter: Payer: Self-pay | Admitting: Family Medicine

## 2016-10-09 DIAGNOSIS — E7800 Pure hypercholesterolemia, unspecified: Secondary | ICD-10-CM | POA: Diagnosis not present

## 2016-10-09 DIAGNOSIS — Z5181 Encounter for therapeutic drug level monitoring: Secondary | ICD-10-CM | POA: Diagnosis not present

## 2016-10-09 DIAGNOSIS — M810 Age-related osteoporosis without current pathological fracture: Secondary | ICD-10-CM

## 2016-10-09 DIAGNOSIS — D473 Essential (hemorrhagic) thrombocythemia: Secondary | ICD-10-CM | POA: Diagnosis not present

## 2016-10-09 DIAGNOSIS — D75839 Thrombocytosis, unspecified: Secondary | ICD-10-CM

## 2016-10-09 NOTE — Assessment & Plan Note (Signed)
Check labs 

## 2016-10-09 NOTE — Assessment & Plan Note (Signed)
Check DEXA; fall precautions; calcium; weight bearing exercise

## 2016-10-09 NOTE — Assessment & Plan Note (Signed)
Check lipids today 

## 2016-10-09 NOTE — Patient Instructions (Signed)
Please do call to schedule your bone density study; the number to schedule one at either [facility] or [facility] is [phone]  Try to limit saturated fats in your diet (bologna, hot dogs, barbeque, cheeseburgers, hamburgers, steak, bacon, sausage, cheese, etc.) and get more fresh fruits, vegetables, and whole grains

## 2016-10-09 NOTE — Progress Notes (Signed)
BP 122/74   Pulse 89   Temp 98.8 F (37.1 C) (Oral)   Resp 14   Wt 159 lb 11.8 oz (72.5 kg)   SpO2 97%   BMI 30.18 kg/m    Subjective:    Patient ID: Tonya Lawson, female    DOB: 14-May-1961, 56 y.o.   MRN: [id number]  HPI: Tonya Lawson is a 56 y.o. female  Chief Complaint  Patient presents with  . Follow-up   Patient is here with her mother Osteoporosis; getting some greens, broccoli and lettuce; not much cheese; taking alendronate; no stomach problems; not getting up on ladders and chairs Last DEXA scan was Feb 2017  High cholesterol; some bologna; not taking the statin  Mentally challenged; lives with mother, gets 24/7 care  Blood pressure controlled today  Depression screen [facility] 2/9 10/09/2016 04/07/2016 12/23/2015 08/30/2015  Decreased Interest 0 0 0 0  Down, Depressed, Hopeless 0 0 0 0  PHQ - 2 Score 0 0 0 0   Relevant past medical, surgical, family and social history reviewed Past Medical History:  Diagnosis Date  . Hypertension   . Kyphosis of thoracic region   . Mental retardation   . Osteoporosis    Past Surgical History:  Procedure Laterality Date  . ABDOMINAL HYSTERECTOMY     completed   Family History  Problem Relation Age of Onset  . Breast cancer Maternal Aunt 70  . Diabetes Mother   . Cancer Father     brain  . Cancer Brother   . Stroke Mother    Social History  Substance Use Topics  . Smoking status: Never Smoker  . Smokeless tobacco: Never Used  . Alcohol use No   Interim medical history since last visit reviewed. Allergies and medications reviewed  Review of Systems Per HPI unless specifically indicated above     Objective:    BP 122/74   Pulse 89   Temp 98.8 F (37.1 C) (Oral)   Resp 14   Wt 159 lb 11.8 oz (72.5 kg)   SpO2 97%   BMI 30.18 kg/m   Wt Readings from Last 3 Encounters:  10/09/16 159 lb 11.8 oz (72.5 kg)  04/07/16 159 lb (72.1 kg)  12/23/15 159 lb 6.4 oz (72.3 kg)    Physical Exam    Constitutional: She appears well-developed and well-nourished.  HENT:  Right Ear: Hearing normal.  Left Ear: Hearing normal.  Mouth/Throat: Mucous membranes are normal.  Eyes: Right eye exhibits no hordeolum. Left eye exhibits no hordeolum. No scleral icterus.  Cardiovascular: Normal rate, regular rhythm, S1 normal, S2 normal and normal heart sounds.   No extrasystoles are present.  Pulmonary/Chest: Effort normal and breath sounds normal. No respiratory distress.  Abdominal: Normal appearance.  Neurological: She is alert. She displays no tremor. Gait normal.  Skin: No bruising and no ecchymosis noted. No cyanosis. No pallor.  Psychiatric: Her speech is normal. Her mood appears not anxious. Cognition and memory are impaired. She does not exhibit a depressed mood.  Rubs her face and eyes when anxious, spoken too; fair eye contact with examiner; poor historian      Assessment & Plan:   Problem List Items Addressed This Visit      Musculoskeletal and Integument   Osteoporosis    Check DEXA; fall precautions; calcium; weight bearing exercise      Relevant Orders   DG Bone Density     Hematopoietic and Hemostatic   Thrombocytosis ([facility])  Check labs      Relevant Orders   CBC with Differential/Platelet (Completed)     Other   Medication monitoring encounter    Check labs      Relevant Orders   Comprehensive Metabolic Panel (CMET) (Completed)   Elevated cholesterol    Check lipids today      Relevant Orders   Lipid panel (Completed)       Follow up plan: Return in about 6 months (around 04/08/2017) for fasting labs and visit.  An after-visit summary was printed and given to the patient at [facility].  Please see the patient instructions which may contain other information and recommendations beyond what is mentioned above in the assessment and plan.  Meds ordered this encounter  Medications  . Multiple Vitamins-Minerals (MULTIVITAMIN WITH MINERALS) tablet    Sig:  Take 1 tablet by mouth daily.  . Garlic 10 MG CAPS    Sig: Take by mouth.  . vitamin B-12 (CYANOCOBALAMIN) 100 MCG tablet    Sig: Take 100 mcg by mouth daily.    Orders Placed This Encounter  Procedures  . DG Bone Density  . CBC with Differential/Platelet  . Comprehensive Metabolic Panel (CMET)  . Lipid panel

## 2016-10-10 LAB — COMPREHENSIVE METABOLIC PANEL
ALT: 17 IU/L (ref 0–32)
AST: 18 IU/L (ref 0–40)
Albumin/Globulin Ratio: 1.7 (ref 1.2–2.2)
Albumin: 4.1 g/dL (ref 3.5–5.5)
Alkaline Phosphatase: 93 IU/L (ref 39–117)
BUN/Creatinine Ratio: 14 (ref 9–23)
BUN: 13 mg/dL (ref 6–24)
Bilirubin Total: 0.2 mg/dL (ref 0.0–1.2)
CALCIUM: 9.8 mg/dL (ref 8.7–10.2)
CO2: 23 mmol/L (ref 18–29)
CREATININE: 0.93 mg/dL (ref 0.57–1.00)
Chloride: 104 mmol/L (ref 96–106)
GFR calc Af Amer: 80 mL/min/{1.73_m2} (ref >59)
GFR, EST NON AFRICAN AMERICAN: 69 mL/min/{1.73_m2} (ref >59)
GLUCOSE: 102 mg/dL — AB (ref 65–99)
Globulin, Total: 2.4 g/dL (ref 1.5–4.5)
Potassium: 4.3 mmol/L (ref 3.5–5.2)
Sodium: 143 mmol/L (ref 134–144)
Total Protein: 6.5 g/dL (ref 6.0–8.5)

## 2016-10-10 LAB — CBC WITH DIFFERENTIAL/PLATELET
BASOS ABS: 0.1 10*3/uL (ref 0.0–0.2)
Basos: 1 %
EOS (ABSOLUTE): 0.2 10*3/uL (ref 0.0–0.4)
Eos: 2 %
HEMATOCRIT: 42.5 % (ref 34.0–46.6)
HEMOGLOBIN: 14.1 g/dL (ref 11.1–15.9)
IMMATURE GRANULOCYTES: 0 %
Immature Grans (Abs): 0 10*3/uL (ref 0.0–0.1)
LYMPHS ABS: 3.9 10*3/uL — AB (ref 0.7–3.1)
Lymphs: 44 %
MCH: 28.7 pg (ref 26.6–33.0)
MCHC: 33.2 g/dL (ref 31.5–35.7)
MCV: 87 fL (ref 79–97)
MONOCYTES: 5 %
Monocytes Absolute: 0.5 10*3/uL (ref 0.1–0.9)
NEUTROS PCT: 48 %
Neutrophils Absolute: 4.2 10*3/uL (ref 1.4–7.0)
Platelets: 363 10*3/uL (ref 150–379)
RBC: 4.91 x10E6/uL (ref 3.77–5.28)
RDW: 15.4 % (ref 12.3–15.4)
WBC: 8.8 10*3/uL (ref 3.4–10.8)

## 2016-10-10 LAB — LIPID PANEL
CHOL/HDL RATIO: 4.6 ratio — AB (ref 0.0–4.4)
Cholesterol, Total: 226 mg/dL — ABNORMAL HIGH (ref 100–199)
HDL: 49 mg/dL (ref >39)
LDL CALC: 128 mg/dL — AB (ref 0–99)
TRIGLYCERIDES: 244 mg/dL — AB (ref 0–149)
VLDL CHOLESTEROL CAL: 49 mg/dL — AB (ref 5–40)

## 2017-04-09 ENCOUNTER — Encounter: Payer: Self-pay | Admitting: Family Medicine

## 2017-04-09 ENCOUNTER — Ambulatory Visit (INDEPENDENT_AMBULATORY_CARE_PROVIDER_SITE_OTHER): Payer: Medicare Other | Admitting: Family Medicine

## 2017-04-09 ENCOUNTER — Ambulatory Visit: Payer: Medicare Other | Admitting: Family Medicine

## 2017-04-09 VITALS — BP 126/72 | HR 99 | Temp 98.0°F | Resp 14 | Ht 59.25 in | Wt 156.6 lb

## 2017-04-09 DIAGNOSIS — Z1231 Encounter for screening mammogram for malignant neoplasm of breast: Secondary | ICD-10-CM | POA: Diagnosis not present

## 2017-04-09 DIAGNOSIS — Z5181 Encounter for therapeutic drug level monitoring: Secondary | ICD-10-CM | POA: Diagnosis not present

## 2017-04-09 DIAGNOSIS — E7800 Pure hypercholesterolemia, unspecified: Secondary | ICD-10-CM | POA: Diagnosis not present

## 2017-04-09 DIAGNOSIS — Z1239 Encounter for other screening for malignant neoplasm of breast: Secondary | ICD-10-CM

## 2017-04-09 DIAGNOSIS — F79 Unspecified intellectual disabilities: Secondary | ICD-10-CM | POA: Diagnosis not present

## 2017-04-09 DIAGNOSIS — D473 Essential (hemorrhagic) thrombocythemia: Secondary | ICD-10-CM | POA: Diagnosis not present

## 2017-04-09 DIAGNOSIS — I10 Essential (primary) hypertension: Secondary | ICD-10-CM

## 2017-04-09 DIAGNOSIS — D75839 Thrombocytosis, unspecified: Secondary | ICD-10-CM

## 2017-04-09 DIAGNOSIS — M810 Age-related osteoporosis without current pathological fracture: Secondary | ICD-10-CM

## 2017-04-09 LAB — COMPLETE METABOLIC PANEL WITH GFR
ALT: 13 U/L (ref 6–29)
AST: 16 U/L (ref 10–35)
Albumin: 4.1 g/dL (ref 3.6–5.1)
Alkaline Phosphatase: 87 U/L (ref 33–130)
BILIRUBIN TOTAL: 0.3 mg/dL (ref 0.2–1.2)
BUN: 14 mg/dL (ref 7–25)
CO2: 22 mmol/L (ref 20–31)
CREATININE: 0.9 mg/dL (ref 0.50–1.05)
Calcium: 9.3 mg/dL (ref 8.6–10.4)
Chloride: 106 mmol/L (ref 98–110)
GFR, EST AFRICAN AMERICAN: 83 mL/min (ref >=60)
GFR, Est Non African American: 72 mL/min (ref >=60)
GLUCOSE: 85 mg/dL (ref 65–99)
Potassium: 4.2 mmol/L (ref 3.5–5.3)
Sodium: 143 mmol/L (ref 135–146)
TOTAL PROTEIN: 6.4 g/dL (ref 6.1–8.1)

## 2017-04-09 LAB — LIPID PANEL
Cholesterol: 250 mg/dL — ABNORMAL HIGH (ref <200)
HDL: 48 mg/dL — ABNORMAL LOW (ref >50)
LDL CALC: 143 mg/dL — AB (ref <100)
Total CHOL/HDL Ratio: 5.2 Ratio — ABNORMAL HIGH (ref <5.0)
Triglycerides: 296 mg/dL — ABNORMAL HIGH (ref <150)
VLDL: 59 mg/dL — ABNORMAL HIGH (ref <30)

## 2017-04-09 NOTE — Assessment & Plan Note (Signed)
Well controlled today.

## 2017-04-09 NOTE — Assessment & Plan Note (Signed)
Resolved on last visit

## 2017-04-09 NOTE — Patient Instructions (Addendum)
Try to limit saturated fats in your diet (bologna, hot dogs, barbeque, cheeseburgers, hamburgers, steak, bacon, sausage, cheese, etc.) and get more fresh fruits, vegetables, and whole grains Stay active   Osteoporosis Osteoporosis happens when your bones become thinner and weaker. Weak bones can break (fracture) more easily when you slip or fall. Bones most at risk of breaking are in the hip, wrist, and spine. Follow these instructions at home:  Get enough calcium and vitamin D. These nutrients are good for your bones.  Exercise as told by your doctor.  Do not use any tobacco products. This includes cigarettes, chewing tobacco, and electronic cigarettes. If you need help quitting, ask your doctor.  Limit the amount of alcohol you drink.  Take medicines only as told by your doctor.  Keep all follow-up visits as told by your doctor. This is important.  Take care at home to prevent falls. Some ways to do this are: ? Keep rooms well lit and tidy. ? Put safety rails on your stairs. ? Put a rubber mat in the bathroom and other places that are often wet or slippery. Get help right away if:  You fall.  You hurt yourself. This information is not intended to replace advice given to you by your health care provider. Make sure you discuss any questions you have with your health care provider. Document Released: 11/20/2011 Document Revised: 02/03/2016 Document Reviewed: 02/05/2014 Elsevier Interactive Patient Education  [employer].

## 2017-04-09 NOTE — Progress Notes (Signed)
BP 126/72   Pulse 99   Temp 98 F (36.7 C) (Oral)   Resp 14   Ht 4' 11.25" (1.505 m)   Wt 156 lb 9.6 oz (71 kg)   SpO2 97%   BMI 31.36 kg/m    Subjective:    Patient ID: Tonya Lawson, female    DOB: 1960/12/17, 56 y.o.   MRN: [id number]  HPI: Tonya Lawson is a 56 y.o. female  Chief Complaint  Patient presents with  . Follow-up  . Medication Refill    HPI  Osteoporosis; on fosamax and calcium; getting weight bearing activity, helps out in the yard and garden  High cholesterol; not taking statin; trying to eat good and lots of vegetables; not a big meat eater; doesn't like eggs; will eat sausage and bacon and ice cream  Mental retardation; requires 24/7 supervision / care with her mother  Due for mammogram; discussed with mother  Depression screen [facility] 2/9 04/09/2017 10/09/2016 04/07/2016 12/23/2015 08/30/2015  Decreased Interest 0 0 0 0 0  Down, Depressed, Hopeless 0 0 0 0 0  PHQ - 2 Score 0 0 0 0 0    Relevant past medical, surgical, family and social history reviewed Past Medical History:  Diagnosis Date  . Hypertension   . Kyphosis of thoracic region   . Mental retardation   . Osteoporosis    Past Surgical History:  Procedure Laterality Date  . ABDOMINAL HYSTERECTOMY     completed   Family History  Problem Relation Age of Onset  . Diabetes Mother   . Stroke Mother   . Cancer Father        brain  . Breast cancer Maternal Aunt 70  . Cancer Brother    Social History   Social History  . Marital status: Unknown    Spouse name: N/A  . Number of children: N/A  . Years of education: N/A   Occupational History  . Not on file.   Social History Main Topics  . Smoking status: Never Smoker  . Smokeless tobacco: Never Used  . Alcohol use No  . Drug use: No  . Sexual activity: No   Other Topics Concern  . Not on file   Social History Narrative  . No narrative on file    Interim medical history since last visit reviewed. Allergies and  medications reviewed  Review of Systems Per HPI unless specifically indicated above     Objective:    BP 126/72   Pulse 99   Temp 98 F (36.7 C) (Oral)   Resp 14   Ht 4' 11.25" (1.505 m)   Wt 156 lb 9.6 oz (71 kg)   SpO2 97%   BMI 31.36 kg/m   Wt Readings from Last 3 Encounters:  04/09/17 156 lb 9.6 oz (71 kg)  10/09/16 159 lb 11.8 oz (72.5 kg)  04/07/16 159 lb (72.1 kg)    Physical Exam  Constitutional: She appears well-developed and well-nourished.  obese  HENT:  Right Ear: Hearing normal.  Left Ear: Hearing normal.  Mouth/Throat: Mucous membranes are normal.  Eyes: Right eye exhibits no hordeolum. Left eye exhibits no hordeolum. No scleral icterus.  Cardiovascular: Normal rate, regular rhythm, S1 normal, S2 normal and normal heart sounds.   No extrasystoles are present.  Pulmonary/Chest: Effort normal and breath sounds normal. No respiratory distress.  Abdominal: Normal appearance.  Neurological: She is alert. She displays no tremor. Gait normal.  Skin: No bruising and no ecchymosis noted. No  cyanosis. No pallor.  Psychiatric: Her speech is normal. Her mood appears not anxious. Cognition and memory are impaired. She does not exhibit a depressed mood.  Rubs her face and eyes when anxious or spoken to, with frequent laughing; fair eye contact with examiner; very poor historian   Results for orders placed or performed in visit on 10/09/16  CBC with Differential/Platelet  Result Value Ref Range   WBC 8.8 3.4 - 10.8 x10E3/uL   RBC 4.91 3.77 - 5.28 x10E6/uL   Hemoglobin 14.1 11.1 - 15.9 g/dL   Hematocrit 42.5 34.0 - 46.6 %   MCV 87 79 - 97 fL   MCH 28.7 26.6 - 33.0 pg   MCHC 33.2 31.5 - 35.7 g/dL   RDW 15.4 12.3 - 15.4 %   Platelets 363 150 - 379 x10E3/uL   Neutrophils 48 Not Estab. %   Lymphs 44 Not Estab. %   Monocytes 5 Not Estab. %   Eos 2 Not Estab. %   Basos 1 Not Estab. %   Neutrophils Absolute 4.2 1.4 - 7.0 x10E3/uL   Lymphocytes Absolute 3.9 (H) 0.7 -  3.1 x10E3/uL   Monocytes Absolute 0.5 0.1 - 0.9 x10E3/uL   EOS (ABSOLUTE) 0.2 0.0 - 0.4 x10E3/uL   Basophils Absolute 0.1 0.0 - 0.2 x10E3/uL   Immature Granulocytes 0 Not Estab. %   Immature Grans (Abs) 0.0 0.0 - 0.1 x10E3/uL  Comprehensive Metabolic Panel (CMET)  Result Value Ref Range   Glucose 102 (H) 65 - 99 mg/dL   BUN 13 6 - 24 mg/dL   Creatinine, Ser 0.93 0.57 - 1.00 mg/dL   GFR calc non Af Amer 69 >59 mL/min/1.73   GFR calc Af Amer 80 >59 mL/min/1.73   BUN/Creatinine Ratio 14 9 - 23   Sodium 143 134 - 144 mmol/L   Potassium 4.3 3.5 - 5.2 mmol/L   Chloride 104 96 - 106 mmol/L   CO2 23 18 - 29 mmol/L   Calcium 9.8 8.7 - 10.2 mg/dL   Total Protein 6.5 6.0 - 8.5 g/dL   Albumin 4.1 3.5 - 5.5 g/dL   Globulin, Total 2.4 1.5 - 4.5 g/dL   Albumin/Globulin Ratio 1.7 1.2 - 2.2   Bilirubin Total 0.2 0.0 - 1.2 mg/dL   Alkaline Phosphatase 93 39 - 117 IU/L   AST 18 0 - 40 IU/L   ALT 17 0 - 32 IU/L  Lipid panel  Result Value Ref Range   Cholesterol, Total 226 (H) 100 - 199 mg/dL   Triglycerides 244 (H) 0 - 149 mg/dL   HDL 49 >39 mg/dL   VLDL Cholesterol Cal 49 (H) 5 - 40 mg/dL   LDL Calculated 128 (H) 0 - 99 mg/dL   Chol/HDL Ratio 4.6 (H) 0.0 - 4.4 ratio units      Assessment & Plan:   Problem List Items Addressed This Visit      Cardiovascular and Mediastinum   Hypertension    Well-controlled today        Musculoskeletal and Integument   Osteoporosis - Primary    Weight-bearing exercise, calcium in diet        Hematopoietic and Hemostatic   RESOLVED: Thrombocytosis ([facility])    Resolved on last visit        Other   Mental retardation    24/7 care with mother      Medication monitoring encounter    Check labs      Relevant Orders   COMPLETE METABOLIC PANEL WITH GFR  Elevated cholesterol    Check cholesterol today; limit meats, saturated fats      Relevant Orders   Lipid panel    Other Visit Diagnoses    Screening for breast cancer       Relevant  Orders   MM Digital Screening       Follow up plan: Return in about 6 months (around 10/10/2017).  An after-visit summary was printed and given to the patient at [facility].  Please see the patient instructions which may contain other information and recommendations beyond what is mentioned above in the assessment and plan.  No orders of the defined types were placed in this encounter.   Orders Placed This Encounter  Procedures  . MM Digital Screening  . COMPLETE METABOLIC PANEL WITH GFR  . Lipid panel

## 2017-04-09 NOTE — Assessment & Plan Note (Signed)
Check cholesterol today; limit meats, saturated fats

## 2017-04-09 NOTE — Assessment & Plan Note (Signed)
Weight-bearing exercise, calcium in diet

## 2017-04-09 NOTE — Assessment & Plan Note (Signed)
24/7 care with mother

## 2017-04-09 NOTE — Assessment & Plan Note (Signed)
Check labs 

## 2017-04-10 ENCOUNTER — Other Ambulatory Visit: Payer: Self-pay | Admitting: Family Medicine

## 2017-04-10 MED ORDER — PRAVASTATIN SODIUM 20 MG PO TABS: 20.0000 mg | ORAL_TABLET | Freq: Every day | ORAL | 1 refills | Status: DC

## 2017-04-10 NOTE — Progress Notes (Signed)
Start new statin

## 2017-04-11 ENCOUNTER — Other Ambulatory Visit: Payer: Self-pay

## 2017-04-11 DIAGNOSIS — E785 Hyperlipidemia, unspecified: Secondary | ICD-10-CM

## 2017-04-11 DIAGNOSIS — Z5181 Encounter for therapeutic drug level monitoring: Secondary | ICD-10-CM

## 2017-04-19 ENCOUNTER — Other Ambulatory Visit: Payer: Self-pay

## 2017-04-19 MED ORDER — PRAVASTATIN SODIUM 20 MG PO TABS: 20.0000 mg | ORAL_TABLET | Freq: Every day | ORAL | 0 refills | Status: DC

## 2017-04-19 NOTE — Telephone Encounter (Signed)
90 day supply

## 2017-05-01 ENCOUNTER — Other Ambulatory Visit: Payer: Self-pay

## 2017-05-01 MED ORDER — ALENDRONATE SODIUM 70 MG PO TABS: 70.0000 mg | ORAL_TABLET | ORAL | 11 refills | Status: DC

## 2017-06-28 ENCOUNTER — Ambulatory Visit (INDEPENDENT_AMBULATORY_CARE_PROVIDER_SITE_OTHER): Payer: Medicare Other

## 2017-06-28 DIAGNOSIS — Z23 Encounter for immunization: Secondary | ICD-10-CM

## 2017-07-13 ENCOUNTER — Ambulatory Visit
Admission: RE | Admit: 2017-07-13 | Discharge: 2017-07-13 | Disposition: A | Discharge status: Home / Self Care | Payer: Medicare Other | Source: Ambulatory Visit | Attending: Family Medicine | Admitting: Family Medicine

## 2017-07-13 DIAGNOSIS — Z1231 Encounter for screening mammogram for malignant neoplasm of breast: Secondary | ICD-10-CM | POA: Insufficient documentation

## 2017-07-13 DIAGNOSIS — Z1239 Encounter for other screening for malignant neoplasm of breast: Secondary | ICD-10-CM

## 2017-07-30 ENCOUNTER — Ambulatory Visit (INDEPENDENT_AMBULATORY_CARE_PROVIDER_SITE_OTHER): Payer: Medicare Other | Admitting: Family Medicine

## 2017-07-30 ENCOUNTER — Encounter: Payer: Self-pay | Admitting: Family Medicine

## 2017-07-30 DIAGNOSIS — E7800 Pure hypercholesterolemia, unspecified: Secondary | ICD-10-CM | POA: Diagnosis not present

## 2017-07-30 DIAGNOSIS — F79 Unspecified intellectual disabilities: Secondary | ICD-10-CM | POA: Diagnosis not present

## 2017-07-30 DIAGNOSIS — I10 Essential (primary) hypertension: Secondary | ICD-10-CM | POA: Diagnosis not present

## 2017-07-30 DIAGNOSIS — M810 Age-related osteoporosis without current pathological fracture: Secondary | ICD-10-CM | POA: Diagnosis not present

## 2017-07-30 MED ORDER — CALCIUM CARBONATE-VITAMIN D 600-400 MG-UNIT PO TABS: 1.0000 | ORAL_TABLET | Freq: Two times a day (BID) | ORAL | 11 refills | Status: DC

## 2017-07-30 NOTE — Assessment & Plan Note (Signed)
Continue statin; limit saturated fats

## 2017-07-30 NOTE — Assessment & Plan Note (Signed)
Requires 24/7 care; FL-2 papers for [facility]a [facility] home

## 2017-07-30 NOTE — Assessment & Plan Note (Signed)
Continue to limit salt

## 2017-07-30 NOTE — Patient Instructions (Signed)
Limit salt and fatty meats Add the calcium plus vitamin D twice a day to help with bones
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
## 2017-07-30 NOTE — Assessment & Plan Note (Signed)
Add calcium plus D; continue bisphosphonate

## 2017-07-31 ENCOUNTER — Ambulatory Visit: Payer: Medicare Other | Admitting: Family Medicine

## 2017-08-20 ENCOUNTER — Encounter: Payer: Medicare Other | Admitting: Family Medicine

## 2017-08-29 ENCOUNTER — Encounter: Payer: Self-pay | Admitting: Family Medicine

## 2017-08-29 ENCOUNTER — Ambulatory Visit (INDEPENDENT_AMBULATORY_CARE_PROVIDER_SITE_OTHER): Payer: Medicare Other | Admitting: Family Medicine

## 2017-08-29 VITALS — BP 112/68 | HR 97 | Temp 98.0°F | Resp 14 | Ht 59.5 in | Wt 157.0 lb

## 2017-08-29 DIAGNOSIS — I10 Essential (primary) hypertension: Secondary | ICD-10-CM

## 2017-08-29 DIAGNOSIS — F79 Unspecified intellectual disabilities: Secondary | ICD-10-CM

## 2017-08-29 DIAGNOSIS — Z79899 Other long term (current) drug therapy: Secondary | ICD-10-CM

## 2017-08-29 DIAGNOSIS — E7800 Pure hypercholesterolemia, unspecified: Secondary | ICD-10-CM

## 2017-08-29 DIAGNOSIS — Z114 Encounter for screening for human immunodeficiency virus [HIV]: Secondary | ICD-10-CM | POA: Diagnosis not present

## 2017-08-29 DIAGNOSIS — Z23 Encounter for immunization: Secondary | ICD-10-CM | POA: Diagnosis not present

## 2017-08-29 DIAGNOSIS — Z1159 Encounter for screening for other viral diseases: Secondary | ICD-10-CM | POA: Insufficient documentation

## 2017-08-29 DIAGNOSIS — Z1211 Encounter for screening for malignant neoplasm of colon: Secondary | ICD-10-CM

## 2017-08-29 DIAGNOSIS — Z66 Do not resuscitate: Secondary | ICD-10-CM

## 2017-08-29 DIAGNOSIS — Z5181 Encounter for therapeutic drug level monitoring: Secondary | ICD-10-CM

## 2017-08-29 DIAGNOSIS — Z0001 Encounter for general adult medical examination with abnormal findings: Secondary | ICD-10-CM

## 2017-08-29 DIAGNOSIS — M810 Age-related osteoporosis without current pathological fracture: Secondary | ICD-10-CM

## 2017-08-29 DIAGNOSIS — Z0000 Encounter for general adult medical examination without abnormal findings: Secondary | ICD-10-CM

## 2017-08-29 DIAGNOSIS — Z01118 Encounter for examination of ears and hearing with other abnormal findings: Secondary | ICD-10-CM

## 2017-08-29 HISTORY — DX: Do not resuscitate: Z66

## 2017-08-29 MED ORDER — ZOSTER VAC RECOMB ADJUVANTED 50 MCG/0.5ML IM SUSR: 0.5000 mL | Freq: Once | INTRAMUSCULAR | 1 refills | Status: AC

## 2017-08-29 NOTE — Progress Notes (Signed)
BP 112/68   Pulse 97   Temp 98 F (36.7 C) (Oral)   Resp 14   Ht 4' 11.5" (1.511 m)   Wt 157 lb (71.2 kg)   SpO2 96%   BMI 31.18 kg/m    Subjective:    Patient ID: Tonya Lawson, female    DOB: 26-Nov-1960, 57 y.o.   MRN: [id number]  HPI: Tonya Lawson is a 56 y.o. female  Chief Complaint  Patient presents with  . Medicare Wellness   HPI Patient is here for a "physical", needs to have this done for group home; they need her to be examined within 30 days of becoming a resident there; she has mental retardation and requires around the clock care and supervision  She has osteoporosis; taking fosamax; no problems with dentition; her sister has had some tooth loss on the fosamax, but patient is doing well  She has high cholesterol; group home is willing to cut down on bacon and sausage if I will put that in writing; patient is taking statin; no problems with that reported  Patient has not had colon cancer screening, hiv or hep C screening; she has not had the new shingles vaccine  USPSTF grade A and B recommendations Depression:  Depression screen [facility] 2/9 08/29/2017 07/30/2017 04/09/2017 10/09/2016 04/07/2016  Decreased Interest 0 0 0 0 0  Down, Depressed, Hopeless 0 0 0 0 0  PHQ - 2 Score 0 0 0 0 0   Hypertension: BP Readings from Last 3 Encounters:  08/29/17 112/68  07/30/17 124/72  04/09/17 126/72   Obesity: Wt Readings from Last 3 Encounters:  08/29/17 157 lb (71.2 kg)  07/30/17 164 lb 14.4 oz (74.8 kg)  04/09/17 156 lb 9.6 oz (71 kg)   BMI Readings from Last 3 Encounters:  08/29/17 31.18 kg/m  07/30/17 33.03 kg/m  04/09/17 31.36 kg/m    Skin cancer: no worrisome mole, one removed a while back, benign, 5 years ago Lung cancer:  nonsmoker Breast cancer: mammo UTD; mother opted to do not do clinical breast exam today Colorectal cancer: order cologuard  BRCA gene screening: family hx of breast and/or ovarian cancer and/or metastatic prostate cancer?  Maternal aunt breast cancer Cervical cancer screening: s/p hyst HIV, hep B, hep C: order STD testing and prevention (chl/gon/syphilis): n/a Intimate partner violence: no abuse Contraception: n/a Osteoporosis: on treatment, no dental loss Fall prevention/vitamin D: discussed  Diet: good eater Exercise: active, walks a lot Alcohol: no Tobacco use: no Aspirin: taking daily Lipids:  Lab Results  Component Value Date   CHOL 250 (H) 04/09/2017   CHOL 226 (H) 10/09/2016   CHOL 272 (H) 04/07/2016   Lab Results  Component Value Date   HDL 48 (L) 04/09/2017   HDL 49 10/09/2016   HDL 61 04/07/2016   Lab Results  Component Value Date   LDLCALC 143 (H) 04/09/2017   LDLCALC 128 (H) 10/09/2016   LDLCALC 178 (H) 04/07/2016   Lab Results  Component Value Date   TRIG 296 (H) 04/09/2017   TRIG 244 (H) 10/09/2016   TRIG 164 (H) 04/07/2016   Lab Results  Component Value Date   CHOLHDL 5.2 (H) 04/09/2017   CHOLHDL 4.6 (H) 10/09/2016   CHOLHDL 4.5 04/07/2016   No results found for: LDLDIRECT Glucose:  Glucose  Date Value Ref Range Status  10/09/2016 102 (H) 65 - 99 mg/dL Final  08/30/2015 99 65 - 99 mg/dL Final   Glucose, Bld  Date Value Ref  Range Status  04/09/2017 85 65 - 99 mg/dL Final  04/07/2016 97 65 - 99 mg/dL Final    6CIT Screen 08/29/2017  What Year? 4 points  What month? 3 points  What time? 3 points  Count back from 20 4 points  Months in reverse 4 points  Repeat phrase 10 points  Total Score 28    Depression screen [facility] 2/9 08/29/2017 07/30/2017 04/09/2017 10/09/2016 04/07/2016  Decreased Interest 0 0 0 0 0  Down, Depressed, Hopeless 0 0 0 0 0  PHQ - 2 Score 0 0 0 0 0   Relevant past medical, surgical, family and social history reviewed Past Medical History:  Diagnosis Date  . DNR (do not resuscitate) 08/29/2017  . Hypertension   . Kyphosis of thoracic region   . Mental retardation   . Osteoporosis    Past Surgical History:  Procedure Laterality  Date  . ABDOMINAL HYSTERECTOMY     completed   Family History  Problem Relation Age of Onset  . Diabetes Mother   . Stroke Mother   . Cancer Father        brain  . Breast cancer Maternal Aunt 70  . Cancer Brother    Social History   Tobacco Use  . Smoking status: Never Smoker  . Smokeless tobacco: Never Used  Substance Use Topics  . Alcohol use: No  . Drug use: No   Interim medical history since last visit reviewed. Allergies and medications reviewed  Review of Systems Per HPI unless specifically indicated above     Objective:    BP 112/68   Pulse 97   Temp 98 F (36.7 C) (Oral)   Resp 14   Ht 4' 11.5" (1.511 m)   Wt 157 lb (71.2 kg)   SpO2 96%   BMI 31.18 kg/m   Wt Readings from Last 3 Encounters:  08/29/17 157 lb (71.2 kg)  07/30/17 164 lb 14.4 oz (74.8 kg)  04/09/17 156 lb 9.6 oz (71 kg)    Physical Exam  Constitutional: She appears well-developed and well-nourished.  HENT:  Head: Normocephalic and atraumatic.  Right Ear: Hearing, tympanic membrane, external ear and ear canal normal.  Left Ear: Hearing, tympanic membrane, external ear and ear canal normal.  Failed whisper test in the LEFT ear; passed with RIGHT ear and BOTH ears  Eyes: Conjunctivae and EOM are normal. Right eye exhibits no hordeolum. Left eye exhibits no hordeolum. No scleral icterus.  Neck: Carotid bruit is not present. No thyromegaly present.  Cardiovascular: Normal rate, regular rhythm, S1 normal, S2 normal and normal heart sounds.  No extrasystoles are present.  Pulmonary/Chest: Effort normal and breath sounds normal. No respiratory distress.  Abdominal: Soft. Normal appearance and bowel sounds are normal. She exhibits no distension, no abdominal bruit, no pulsatile midline mass and no mass. There is no hepatosplenomegaly. There is no tenderness. No hernia.  Musculoskeletal: Normal range of motion. She exhibits no edema.  Lymphadenopathy:       Head (right side): No submandibular  adenopathy present.       Head (left side): No submandibular adenopathy present.    She has no cervical adenopathy.    She has no axillary adenopathy.  Neurological: She is alert. She displays no tremor. No cranial nerve deficit.  Reflex Scores:      Patellar reflexes are 2+ on the right side and 2+ on the left side. Able to get on and off exam table on her own  Skin: Skin is  warm and dry. No bruising and no ecchymosis noted. No cyanosis. No pallor.  Psychiatric: Her mood appears not anxious. She does not exhibit a depressed mood.  Very little speech; covers her face with her hands when talked to directly; childish behaviors; no aggression; limited eye contact with examiner and caregivers   Results for orders placed or performed in visit on 04/09/17  COMPLETE METABOLIC PANEL WITH GFR  Result Value Ref Range   Sodium 143 135 - 146 mmol/L   Potassium 4.2 3.5 - 5.3 mmol/L   Chloride 106 98 - 110 mmol/L   CO2 22 20 - 31 mmol/L   Glucose, Bld 85 65 - 99 mg/dL   BUN 14 7 - 25 mg/dL   Creat 0.90 0.50 - 1.05 mg/dL   Total Bilirubin 0.3 0.2 - 1.2 mg/dL   Alkaline Phosphatase 87 33 - 130 U/L   AST 16 10 - 35 U/L   ALT 13 6 - 29 U/L   Total Protein 6.4 6.1 - 8.1 g/dL   Albumin 4.1 3.6 - 5.1 g/dL   Calcium 9.3 8.6 - 10.4 mg/dL   GFR, Est African American 83 >=60 mL/min   GFR, Est Non African American 72 >=60 mL/min  Lipid panel  Result Value Ref Range   Cholesterol 250 (H) <200 mg/dL   Triglycerides 296 (H) <150 mg/dL   HDL 48 (L) >50 mg/dL   Total CHOL/HDL Ratio 5.2 (H) <5.0 Ratio   VLDL 59 (H) <30 mg/dL   LDL Cholesterol 143 (H) <100 mg/dL      Assessment & Plan:   Problem List Items Addressed This Visit      Cardiovascular and Mediastinum   Hypertension - Primary    Well-controlled today        Musculoskeletal and Integument   Osteoporosis    On medication; fall prevention; calcium plus vitamin D      Relevant Orders   VITAMIN D 25 Hydroxy (Vit-D Deficiency, Fractures)      Other   Preventative health care    USPSTF grade A and B recommendations reviewed with patient; age-appropriate recommendations, preventive care, screening tests, etc discussed and encouraged; healthy living encouraged; see AVS for patient education given to patient       Mental retardation    Needs to live in group home environment; 24/7 around the clock care; cannot self-medicate      Medication monitoring encounter    Check liver and kidney      Relevant Orders   COMPLETE METABOLIC PANEL WITH GFR   Encounter for screening for HIV   Relevant Orders   HIV antibody   Encounter for hepatitis C screening test for low risk patient   Relevant Orders   Hepatitis C antibody   Elevated cholesterol    Recheck lipids; continue statin; limit fatty meats      Relevant Orders   Lipid panel   DNR (do not resuscitate)    Discussed with mother; paperwork given to mother      Relevant Orders   DNR (Do Not Resuscitate)   Colon cancer screening    Cologuard recommended, ordered      Relevant Orders   Cologuard    Other Visit Diagnoses    Hearing screen with abnormal findings       refer to ENT   Relevant Orders   Ambulatory referral to ENT   Need for shingles vaccine       discussed shingrix, which can be done through pharmacy  Follow up plan: No Follow-up on file.  An after-visit summary was printed and given to the patient at [place].  Please see the patient instructions which may contain other information and recommendations beyond what is mentioned above in the assessment and plan.  Meds ordered this encounter  Medications  . Zoster Vaccine Adjuvanted [facility]) injection    Sig: Inject 0.5 mLs into the muscle once for 1 dose. Followed by a booster 2-6 months later    Dispense:  0.5 mL    Refill:  1    Orders Placed This Encounter  Procedures  . Cologuard  . HIV antibody  . Hepatitis C antibody  . COMPLETE METABOLIC PANEL WITH GFR  . Lipid panel    . VITAMIN D 25 Hydroxy (Vit-D Deficiency, Fractures)  . Ambulatory referral to ENT  . DNR (Do Not Resuscitate)

## 2017-08-29 NOTE — Patient Instructions (Addendum)
Limit fatty meats (bologna, hot dogs, etc) Avoid bacon and sausage entirely made from pigs  Try to get more fresh fruits, vegetables, and whole grains  Let's get labs today Please have her do the Cologuard (to screen for colon cancer)  Health Maintenance, Female Adopting a healthy lifestyle and getting preventive care can go a long way to promote health and wellness. Talk with your health care provider about what schedule of regular examinations is right for you. This is a good chance for you to check in with your provider about disease prevention and staying healthy. In between checkups, there are plenty of things you can do on your own. Experts have done a lot of research about which lifestyle changes and preventive measures are most likely to keep you healthy. Ask your health care provider for more information. Weight and diet Eat a healthy diet  Be sure to include plenty of vegetables, fruits, low-fat dairy products, and lean protein.  Do not eat a lot of foods high in solid fats, added sugars, or salt.  Get regular exercise. This is one of the most important things you can do for your health. ? Most adults should exercise for at least 150 minutes each week. The exercise should increase your heart rate and make you sweat (moderate-intensity exercise). ? Most adults should also do strengthening exercises at least twice a week. This is in addition to the moderate-intensity exercise.  Maintain a healthy weight  Body mass index (BMI) is a measurement that can be used to identify possible weight problems. It estimates body fat based on height and weight. Your health care provider can help determine your BMI and help you achieve or maintain a healthy weight.  For females 55 years of age and older: ? A BMI below 18.5 is considered underweight. ? A BMI of 18.5 to 24.9 is normal. ? A BMI of 25 to 29.9 is considered overweight. ? A BMI of 30 and above is considered obese.  Watch levels of  cholesterol and blood lipids  You should start having your blood tested for lipids and cholesterol at 56 years of age, then have this test every 5 years.  You may need to have your cholesterol levels checked more often if: ? Your lipid or cholesterol levels are high. ? You are older than 56 years of age. ? You are at high risk for heart disease.  Cancer screening Lung Cancer  Lung cancer screening is recommended for adults 28-74 years old who are at high risk for lung cancer because of a history of smoking.  A yearly low-dose CT scan of the lungs is recommended for people who: ? Currently smoke. ? Have quit within the past 15 years. ? Have at least a 30-pack-year history of smoking. A pack year is smoking an average of one pack of cigarettes a day for 1 year.  Yearly screening should continue until it has been 15 years since you quit.  Yearly screening should stop if you develop a health problem that would prevent you from having lung cancer treatment.  Breast Cancer  Practice breast self-awareness. This means understanding how your breasts normally appear and feel.  It also means doing regular breast self-exams. Let your health care provider know about any changes, no matter how small.  If you are in your 20s or 30s, you should have a clinical breast exam (CBE) by a health care provider every 1-3 years as part of a regular health exam.  If you are  48 or older, have a CBE every year. Also consider having a breast X-ray (mammogram) every year.  If you have a family history of breast cancer, talk to your health care provider about genetic screening.  If you are at high risk for breast cancer, talk to your health care provider about having an MRI and a mammogram every year.  Breast cancer gene (BRCA) assessment is recommended for women who have family members with BRCA-related cancers. BRCA-related cancers include: ? Breast. ? Ovarian. ? Tubal. ? Peritoneal cancers.  Results  of the assessment will determine the need for genetic counseling and BRCA1 and BRCA2 testing.  Cervical Cancer Your health care provider may recommend that you be screened regularly for cancer of the pelvic organs (ovaries, uterus, and vagina). This screening involves a pelvic examination, including checking for microscopic changes to the surface of your cervix (Pap test). You may be encouraged to have this screening done every 3 years, beginning at age 60.  For women ages 62-65, health care providers may recommend pelvic exams and Pap testing every 3 years, or they may recommend the Pap and pelvic exam, combined with testing for human papilloma virus (HPV), every 5 years. Some types of HPV increase your risk of cervical cancer. Testing for HPV may also be done on women of any age with unclear Pap test results.  Other health care providers may not recommend any screening for nonpregnant women who are considered low risk for pelvic cancer and who do not have symptoms. Ask your health care provider if a screening pelvic exam is right for you.  If you have had past treatment for cervical cancer or a condition that could lead to cancer, you need Pap tests and screening for cancer for at least 20 years after your treatment. If Pap tests have been discontinued, your risk factors (such as having a new sexual partner) need to be reassessed to determine if screening should resume. Some women have medical problems that increase the chance of getting cervical cancer. In these cases, your health care provider may recommend more frequent screening and Pap tests.  Colorectal Cancer  This type of cancer can be detected and often prevented.  Routine colorectal cancer screening usually begins at 56 years of age and continues through 56 years of age.  Your health care provider may recommend screening at an earlier age if you have risk factors for colon cancer.  Your health care provider may also recommend using  home test kits to check for hidden blood in the stool.  A small camera at the end of a tube can be used to examine your colon directly (sigmoidoscopy or colonoscopy). This is done to check for the earliest forms of colorectal cancer.  Routine screening usually begins at age 58.  Direct examination of the colon should be repeated every 5-10 years through 56 years of age. However, you may need to be screened more often if early forms of precancerous polyps or small growths are found.  Skin Cancer  Check your skin from head to toe regularly.  Tell your health care provider about any new moles or changes in moles, especially if there is a change in a mole's shape or color.  Also tell your health care provider if you have a mole that is larger than the size of a pencil eraser.  Always use sunscreen. Apply sunscreen liberally and repeatedly throughout the day.  Protect yourself by wearing long sleeves, pants, a wide-brimmed hat, and sunglasses whenever you  are outside.  Heart disease, diabetes, and high blood pressure  High blood pressure causes heart disease and increases the risk of stroke. High blood pressure is more likely to develop in: ? People who have blood pressure in the high end of the normal range (130-139/85-89 mm Hg). ? People who are overweight or obese. ? People who are African American.  If you are 67-11 years of age, have your blood pressure checked every 3-5 years. If you are 50 years of age or older, have your blood pressure checked every year. You should have your blood pressure measured twice-once when you are at a hospital or clinic, and once when you are not at a hospital or clinic. Record the average of the two measurements. To check your blood pressure when you are not at a hospital or clinic, you can use: ? An automated blood pressure machine at a pharmacy. ? A home blood pressure monitor.  If you are between 70 years and 36 years old, ask your health care provider  if you should take aspirin to prevent strokes.  Have regular diabetes screenings. This involves taking a blood sample to check your fasting blood sugar level. ? If you are at a normal weight and have a low risk for diabetes, have this test once every three years after 56 years of age. ? If you are overweight and have a high risk for diabetes, consider being tested at a younger age or more often. Preventing infection Hepatitis B  If you have a higher risk for hepatitis B, you should be screened for this virus. You are considered at high risk for hepatitis B if: ? You were born in a country where hepatitis B is common. Ask your health care provider which countries are considered high risk. ? Your parents were born in a high-risk country, and you have not been immunized against hepatitis B (hepatitis B vaccine). ? You have HIV or AIDS. ? You use needles to inject street drugs. ? You live with someone who has hepatitis B. ? You have had sex with someone who has hepatitis B. ? You get hemodialysis treatment. ? You take certain medicines for conditions, including cancer, organ transplantation, and autoimmune conditions.  Hepatitis C  Blood testing is recommended for: ? Everyone born from [birth year] through 1965. ? Anyone with known risk factors for hepatitis C.  Sexually transmitted infections (STIs)  You should be screened for sexually transmitted infections (STIs) including gonorrhea and chlamydia if: ? You are sexually active and are younger than 56 years of age. ? You are older than 56 years of age and your health care provider tells you that you are at risk for this type of infection. ? Your sexual activity has changed since you were last screened and you are at an increased risk for chlamydia or gonorrhea. Ask your health care provider if you are at risk.  If you do not have HIV, but are at risk, it may be recommended that you take a prescription medicine daily to prevent HIV infection. This  is called pre-exposure prophylaxis (PrEP). You are considered at risk if: ? You are sexually active and do not regularly use condoms or know the HIV status of your partner(s). ? You take drugs by injection. ? You are sexually active with a partner who has HIV.  Talk with your health care provider about whether you are at high risk of being infected with HIV. If you choose to begin PrEP, you should first be  tested for HIV. You should then be tested every 3 months for as long as you are taking PrEP. Pregnancy  If you are premenopausal and you may become pregnant, ask your health care provider about preconception counseling.  If you may become pregnant, take 400 to 800 micrograms (mcg) of folic acid every day.  If you want to prevent pregnancy, talk to your health care provider about birth control (contraception). Osteoporosis and menopause  Osteoporosis is a disease in which the bones lose minerals and strength with aging. This can result in serious bone fractures. Your risk for osteoporosis can be identified using a bone density scan.  If you are 66 years of age or older, or if you are at risk for osteoporosis and fractures, ask your health care provider if you should be screened.  Ask your health care provider whether you should take a calcium or vitamin D supplement to lower your risk for osteoporosis.  Menopause may have certain physical symptoms and risks.  Hormone replacement therapy may reduce some of these symptoms and risks. Talk to your health care provider about whether hormone replacement therapy is right for you. Follow these instructions at home:  Schedule regular health, dental, and eye exams.  Stay current with your immunizations.  Do not use any tobacco products including cigarettes, chewing tobacco, or electronic cigarettes.  If you are pregnant, do not drink alcohol.  If you are breastfeeding, limit how much and how often you drink alcohol.  Limit alcohol intake  to no more than 1 drink per day for nonpregnant women. One drink equals 12 ounces of beer, 5 ounces of wine, or 1 ounces of hard liquor.  Do not use street drugs.  Do not share needles.  Ask your health care provider for help if you need support or information about quitting drugs.  Tell your health care provider if you often feel depressed.  Tell your health care provider if you have ever been abused or do not feel safe at home. This information is not intended to replace advice given to you by your health care provider. Make sure you discuss any questions you have with your health care provider. Document Released: 03/13/2011 Document Revised: 02/03/2016 Document Reviewed: 06/01/2015 Elsevier Interactive Patient Education  [employer].

## 2017-08-29 NOTE — Assessment & Plan Note (Signed)
Well controlled today.

## 2017-08-29 NOTE — Assessment & Plan Note (Signed)
On medication; fall prevention; calcium plus vitamin D

## 2017-08-29 NOTE — Assessment & Plan Note (Signed)
Cologuard recommended, ordered

## 2017-08-29 NOTE — Assessment & Plan Note (Signed)
USPSTF grade A and B recommendations reviewed with patient; age-appropriate recommendations, preventive care, screening tests, etc discussed and encouraged; healthy living encouraged; see AVS for patient education given to patient  

## 2017-08-29 NOTE — Assessment & Plan Note (Signed)
Discussed with mother; paperwork given to mother

## 2017-08-29 NOTE — Assessment & Plan Note (Signed)
Recheck lipids; continue statin; limit fatty meats

## 2017-08-29 NOTE — Assessment & Plan Note (Signed)
Needs to live in group home environment; 24/7 around the clock care; cannot self-medicate

## 2017-08-29 NOTE — Assessment & Plan Note (Signed)
Check liver and kidney 

## 2017-08-30 ENCOUNTER — Telehealth: Payer: Self-pay

## 2017-08-30 ENCOUNTER — Other Ambulatory Visit: Payer: Self-pay | Admitting: Family Medicine

## 2017-08-30 LAB — HEPATITIS C ANTIBODY
HEP C AB: NONREACTIVE
SIGNAL TO CUT-OFF: 0.02 (ref <1.00)

## 2017-08-30 LAB — COMPLETE METABOLIC PANEL WITH GFR
AG Ratio: 1.6 (calc) (ref 1.0–2.5)
ALKALINE PHOSPHATASE (APISO): 92 U/L (ref 33–130)
ALT: 11 U/L (ref 6–29)
AST: 15 U/L (ref 10–35)
Albumin: 4.2 g/dL (ref 3.6–5.1)
BILIRUBIN TOTAL: 0.4 mg/dL (ref 0.2–1.2)
BUN: 15 mg/dL (ref 7–25)
CHLORIDE: 107 mmol/L (ref 98–110)
CO2: 28 mmol/L (ref 20–32)
CREATININE: 0.93 mg/dL (ref 0.50–1.05)
Calcium: 10 mg/dL (ref 8.6–10.4)
GFR, Est African American: 80 mL/min/{1.73_m2} (ref >=60)
GFR, Est Non African American: 69 mL/min/{1.73_m2} (ref >=60)
GLUCOSE: 90 mg/dL (ref 65–99)
Globulin: 2.6 g/dL (calc) (ref 1.9–3.7)
Potassium: 4.3 mmol/L (ref 3.5–5.3)
Sodium: 142 mmol/L (ref 135–146)
Total Protein: 6.8 g/dL (ref 6.1–8.1)

## 2017-08-30 LAB — HIV ANTIBODY (ROUTINE TESTING W REFLEX): HIV 1&2 Ab, 4th Generation: NONREACTIVE

## 2017-08-30 LAB — LIPID PANEL
CHOL/HDL RATIO: 2.7 (calc) (ref <5.0)
CHOLESTEROL: 153 mg/dL (ref <200)
HDL: 57 mg/dL (ref >50)
LDL CHOLESTEROL (CALC): 74 mg/dL
Non-HDL Cholesterol (Calc): 96 mg/dL (calc) (ref <130)
Triglycerides: 140 mg/dL (ref <150)

## 2017-08-30 LAB — VITAMIN D 25 HYDROXY (VIT D DEFICIENCY, FRACTURES): VIT D 25 HYDROXY: 45 ng/mL (ref 30–100)

## 2017-08-30 MED ORDER — PRAVASTATIN SODIUM 20 MG PO TABS: 20.0000 mg | ORAL_TABLET | Freq: Every day | ORAL | 3 refills | Status: DC

## 2017-08-30 NOTE — Progress Notes (Signed)
Statin refills sent to [place]

## 2017-08-30 NOTE — Telephone Encounter (Signed)
erro  neous encounter

## 2017-09-26 DIAGNOSIS — H900 Conductive hearing loss, bilateral: Secondary | ICD-10-CM | POA: Diagnosis not present

## 2017-09-26 DIAGNOSIS — H6122 Impacted cerumen, left ear: Secondary | ICD-10-CM | POA: Diagnosis not present

## 2017-10-08 ENCOUNTER — Ambulatory Visit: Payer: Medicare Other | Admitting: Family Medicine

## 2017-10-09 ENCOUNTER — Encounter: Payer: Self-pay | Admitting: Family Medicine

## 2017-10-09 ENCOUNTER — Ambulatory Visit (INDEPENDENT_AMBULATORY_CARE_PROVIDER_SITE_OTHER): Payer: Medicare Other | Admitting: Family Medicine

## 2017-10-09 DIAGNOSIS — E7800 Pure hypercholesterolemia, unspecified: Secondary | ICD-10-CM

## 2017-10-09 DIAGNOSIS — M810 Age-related osteoporosis without current pathological fracture: Secondary | ICD-10-CM

## 2017-10-09 DIAGNOSIS — F79 Unspecified intellectual disabilities: Secondary | ICD-10-CM | POA: Diagnosis not present

## 2017-10-09 DIAGNOSIS — H2513 Age-related nuclear cataract, bilateral: Secondary | ICD-10-CM | POA: Diagnosis not present

## 2017-10-09 DIAGNOSIS — I10 Essential (primary) hypertension: Secondary | ICD-10-CM | POA: Diagnosis not present

## 2017-10-09 DIAGNOSIS — E669 Obesity, unspecified: Secondary | ICD-10-CM | POA: Diagnosis not present

## 2017-10-09 NOTE — Patient Instructions (Addendum)
Check out the information at familydoctor.org entitled "Nutrition for Weight Loss: What You Need to Know about Fad Diets" Try to lose between 1-2 pounds per week by taking in fewer calories and burning off more calories You can succeed by limiting portions, limiting foods dense in calories and fat, becoming more active, and drinking 8 glasses of water a day (64 ounces) Don't skip meals, especially breakfast, as skipping meals may alter your metabolism Do not use over-the-counter weight loss pills or gimmicks that claim rapid weight loss A healthy BMI (or body mass index) is between 18.5 and 24.9 You can calculate your ideal BMI at the [place] website ClubMon[employer].fr Please do call to schedule your bone density study; the number to schedule one at either [facility] or [facility] is [phone] or [phone]

## 2017-10-09 NOTE — Assessment & Plan Note (Signed)
Fall prevention discussed; 3 servings of calcium a day; DEXA ordered, group home will transport

## 2017-10-09 NOTE — Assessment & Plan Note (Signed)
Living at group home

## 2017-10-09 NOTE — Assessment & Plan Note (Signed)
Work on losing 5 pounds

## 2017-10-09 NOTE — Assessment & Plan Note (Signed)
Well-controlled; avoid extra salt

## 2017-10-09 NOTE — Progress Notes (Signed)
BP 122/84   Pulse 99   Temp (!) 97.4 F (36.3 C) (Oral)   Resp 16   Wt 160 lb 6.4 oz (72.8 kg)   SpO2 96%   BMI 31.85 kg/m    Subjective:    Patient ID: Tonya Lawson, female    DOB: [birth year], 57 y.o.   MRN: [id number]  HPI: Tonya Lawson is a 57 y.o. female  Chief Complaint  Patient presents with  . Follow-up    HPI  Patient here for f/u No medical excitement  High cholesterol; at group home so they fix her meals; not many eggs; not much cheese; bran muffins and brown bread; does not like oatmeal; lots of salad and green beans; not often eating french fries; no muscle pain Lab Results  Component Value Date   CHOL 153 08/29/2017   HDL 57 08/29/2017   LDLCALC 143 (H) 04/09/2017   TRIG 140 08/29/2017   CHOLHDL 2.7 08/29/2017  total chol dropped almost 100 points  Osteoporosis, loves yogurt; getting some mixed greens; last DEXA Feb 2017; mother and older sister has osteoporosis  HTN; no added salt, does not even put salt on the table at the group home  Depression screen [facility] 2/9 10/09/2017 08/29/2017 07/30/2017 04/09/2017 10/09/2016  Decreased Interest 0 0 0 0 0  Down, Depressed, Hopeless 0 0 0 0 0  PHQ - 2 Score 0 0 0 0 0    Relevant past medical, surgical, family and social history reviewed Past Medical History:  Diagnosis Date  . DNR (do not resuscitate) 08/29/2017  . Hypertension   . Kyphosis of thoracic region   . Mental retardation   . Osteoporosis    Past Surgical History:  Procedure Laterality Date  . ABDOMINAL HYSTERECTOMY     completed   Family History  Problem Relation Age of Onset  . Diabetes Mother   . Stroke Mother   . Cancer Father        brain  . Breast cancer Maternal Aunt 70  . Cancer Brother    Social History   Tobacco Use  . Smoking status: Never Smoker  . Smokeless tobacco: Never Used  Substance Use Topics  . Alcohol use: No  . Drug use: No    Interim medical history since last visit reviewed. Allergies and  medications reviewed  Review of Systems Per HPI unless specifically indicated above     Objective:    BP 122/84   Pulse 99   Temp (!) 97.4 F (36.3 C) (Oral)   Resp 16   Wt 160 lb 6.4 oz (72.8 kg)   SpO2 96%   BMI 31.85 kg/m   Wt Readings from Last 3 Encounters:  10/09/17 160 lb 6.4 oz (72.8 kg)  08/29/17 157 lb (71.2 kg)  07/30/17 164 lb 14.4 oz (74.8 kg)    Physical Exam  Constitutional: She appears well-developed and well-nourished.  obese  HENT:  Mouth/Throat: Mucous membranes are normal.  Eyes: No scleral icterus.  Cardiovascular: Normal rate and regular rhythm.  Pulmonary/Chest: Effort normal and breath sounds normal. She has no wheezes.  Abdominal: She exhibits no distension.  Neurological: She is alert.  Not able to state year or month; unable to state her name, just shakes head back and forth  Psychiatric: She has a normal mood and affect. Her behavior is normal.    Results for orders placed or performed in visit on 08/29/17  HIV antibody  Result Value Ref Range   HIV 1&2  Ab, 4th Generation NON-REACTIVE NON-REACTI  Hepatitis C antibody  Result Value Ref Range   Hepatitis C Ab NON-REACTIVE NON-REACTI   SIGNAL TO CUT-OFF 0.02 <1.00  COMPLETE METABOLIC PANEL WITH GFR  Result Value Ref Range   Glucose, Bld 90 65 - 99 mg/dL   BUN 15 7 - 25 mg/dL   Creat 0.93 0.50 - 1.05 mg/dL   GFR, Est Non African American 69 > OR = 60 mL/min/1.46m2   GFR, Est African American 80 > OR = 60 mL/min/1.50m2   BUN/Creatinine Ratio NOT APPLICABLE 6 - 22 (calc)   Sodium 142 135 - 146 mmol/L   Potassium 4.3 3.5 - 5.3 mmol/L   Chloride 107 98 - 110 mmol/L   CO2 28 20 - 32 mmol/L   Calcium 10.0 8.6 - 10.4 mg/dL   Total Protein 6.8 6.1 - 8.1 g/dL   Albumin 4.2 3.6 - 5.1 g/dL   Globulin 2.6 1.9 - 3.7 g/dL (calc)   AG Ratio 1.6 1.0 - 2.5 (calc)   Total Bilirubin 0.4 0.2 - 1.2 mg/dL   Alkaline phosphatase (APISO) 92 33 - 130 U/L   AST 15 10 - 35 U/L   ALT 11 6 - 29 U/L  Lipid  panel  Result Value Ref Range   Cholesterol 153 <200 mg/dL   HDL 57 >50 mg/dL   Triglycerides 140 <150 mg/dL   LDL Cholesterol (Calc) 74 mg/dL (calc)   Total CHOL/HDL Ratio 2.7 <5.0 (calc)   Non-HDL Cholesterol (Calc) 96 <130 mg/dL (calc)  VITAMIN D 25 Hydroxy (Vit-D Deficiency, Fractures)  Result Value Ref Range   Vit D, 25-Hydroxy 45 30 - 100 ng/mL      Assessment & Plan:   Problem List Items Addressed This Visit      Cardiovascular and Mediastinum   Hypertension    Well-controlled; avoid extra salt        Musculoskeletal and Integument   Osteoporosis    Fall prevention discussed; 3 servings of calcium a day; DEXA ordered, group home will transport      Relevant Orders   DG Bone Density     Other   Obesity (BMI 30.0-34.9)    Work on losing 5 pounds      Mental retardation    Living at group home      Elevated cholesterol    Continue medicine; no side effects; keep trying to eat healthy          Follow up plan: No Follow-up on file.  An after-visit summary was printed and given to the patient at [place].  Please see the patient instructions which may contain other information and recommendations beyond what is mentioned above in the assessment and plan.  No orders of the defined types were placed in this encounter.   Orders Placed This Encounter  Procedures  . DG Bone Density

## 2017-10-09 NOTE — Assessment & Plan Note (Signed)
Continue medicine; no side effects; keep trying to eat healthy

## 2017-10-25 ENCOUNTER — Telehealth: Payer: Self-pay | Admitting: Family Medicine

## 2017-10-25 NOTE — Telephone Encounter (Signed)
Copied from [place] [id number]. Topic: Quick Communication - See Telephone Encounter >> Oct 25, 2017  3:06 PM Clack, Laban Emperor wrote: CRM for notification. See Telephone encounter for: Pt caregiver Letta Median states pt went to the dentist today and her BP was 158/110 and 156/108. Please f/u with pt caregiver Letta Median @ [phone]   10/25/17.

## 2017-10-25 NOTE — Telephone Encounter (Signed)
I'm going to guess that some of that was anxiety related to being at the dentist. Please have her come in tomorrow (Friday 2/15) or Monday (2/18) for a CMA visit for BP check Thank you

## 2017-10-26 NOTE — Telephone Encounter (Signed)
Tonya Lawson notified they will check over the weekend and let us know on Monday and come in for a bp check then.

## 2017-10-29 ENCOUNTER — Ambulatory Visit: Payer: Medicare Other

## 2017-11-16 ENCOUNTER — Ambulatory Visit: Payer: Medicare Other | Admitting: Family Medicine

## 2017-11-19 ENCOUNTER — Ambulatory Visit
Admission: RE | Admit: 2017-11-19 | Discharge: 2017-11-19 | Disposition: A | Discharge status: Home / Self Care | Payer: Medicare Other | Source: Ambulatory Visit | Attending: Family Medicine | Admitting: Family Medicine

## 2017-11-19 ENCOUNTER — Encounter: Payer: Self-pay | Admitting: Family Medicine

## 2017-11-19 ENCOUNTER — Telehealth: Payer: Self-pay

## 2017-11-19 ENCOUNTER — Ambulatory Visit (INDEPENDENT_AMBULATORY_CARE_PROVIDER_SITE_OTHER): Payer: Medicare Other | Admitting: Family Medicine

## 2017-11-19 VITALS — BP 144/94 | HR 103 | Temp 97.5°F | Ht 59.75 in | Wt 153.9 lb

## 2017-11-19 DIAGNOSIS — J40 Bronchitis, not specified as acute or chronic: Secondary | ICD-10-CM | POA: Insufficient documentation

## 2017-11-19 DIAGNOSIS — R059 Cough, unspecified: Secondary | ICD-10-CM

## 2017-11-19 DIAGNOSIS — R05 Cough: Secondary | ICD-10-CM

## 2017-11-19 DIAGNOSIS — I10 Essential (primary) hypertension: Secondary | ICD-10-CM

## 2017-11-19 MED ORDER — DM-APAP-CPM 30-650-4 MG/30ML PO LIQD: 15.0000 mL | Freq: Four times a day (QID) | ORAL | 0 refills | Status: DC | PRN

## 2017-11-19 MED ORDER — AMLODIPINE BESYLATE 2.5 MG PO TABS: 2.5000 mg | ORAL_TABLET | Freq: Every day | ORAL | 3 refills | Status: DC

## 2017-11-19 MED ORDER — AZITHROMYCIN 250 MG PO TABS: ORAL_TABLET | ORAL | 0 refills | Status: DC

## 2017-11-19 NOTE — Progress Notes (Signed)
BP (!) 144/94 (BP Location: Right Arm, Patient Position: Sitting, Cuff Size: Large)   Pulse (!) 103   Temp (!) 97.5 F (36.4 C) (Oral)   Ht 4' 11.75" (1.518 m)   Wt 153 lb 14.4 oz (69.8 kg)   SpO2 94%   BMI 30.31 kg/m    Subjective:    Patient ID: Tonya Lawson, female    DOB: 07-01-1961, 57 y.o.   MRN: [id number]  HPI: Tonya Lawson is a 57 y.o. female  Chief Complaint  Patient presents with  . Form Completion    [employer]     HPI  Patient is here with group home worker; mother is not here today; patient was originally scheduled for [employer] form; however, the entire section that is supposed to be filled out by the parent is blank and mother is not here; we will reschedule her for the [employer] form; I explained to the group home worker that mother either needs to fill out her section and sign or she needs to come with  her  She has had a cold over the weekend; was with her mother; mother was giving coricidin HBP and now has generic Nyquil; got sick on Thursday or Friday; coughing, barky; no runny nose, no headache, no body aches; poor appetite; no fevers; just coughing  High blood pressures at dentist; 158/108 and 158/110; 10/25/17; see form on the chart One staff member got 144/94 on the right; Tiffany got 108/62 on the left  Depression screen [facility] 2/9 10/09/2017 08/29/2017 07/30/2017 04/09/2017 10/09/2016  Decreased Interest 0 0 0 0 0  Down, Depressed, Hopeless 0 0 0 0 0  PHQ - 2 Score 0 0 0 0 0   Relevant past medical, surgical, family and social history reviewed Past Medical History:  Diagnosis Date  . DNR (do not resuscitate) 08/29/2017  . Hypertension   . Kyphosis of thoracic region   . Mental retardation   . Osteoporosis    Past Surgical History:  Procedure Laterality Date  . ABDOMINAL HYSTERECTOMY     completed   Family History  Problem Relation Age of Onset  . Diabetes Mother   . Stroke Mother   . Cancer Father        brain    . Breast cancer Maternal Aunt 70  . Cancer Brother    Social History   Tobacco Use  . Smoking status: Never Smoker  . Smokeless tobacco: Never Used  Substance Use Topics  . Alcohol use: No  . Drug use: No    Interim medical history since last visit reviewed. Allergies and medications reviewed  Review of Systems Per HPI unless specifically indicated above     Objective:    BP (!) 144/94 (BP Location: Right Arm, Patient Position: Sitting, Cuff Size: Large)   Pulse (!) 103   Temp (!) 97.5 F (36.4 C) (Oral)   Ht 4' 11.75" (1.518 m)   Wt 153 lb 14.4 oz (69.8 kg)   SpO2 94%   BMI 30.31 kg/m   Wt Readings from Last 3 Encounters:  11/19/17 153 lb 14.4 oz (69.8 kg)  10/09/17 160 lb 6.4 oz (72.8 kg)  08/29/17 157 lb (71.2 kg)    Physical Exam  Constitutional: She appears well-developed and well-nourished. No distress.  Appears to not feel well, but nontoxic; smiled a couple of times, but otherwise sat in the chair relatively still; occasional cough  HENT:  Right Ear: External ear normal.  Left Ear:  External ear normal.  Mouth/Throat: Oropharynx is clear and moist. No oropharyngeal exudate.  Eyes: Right eye exhibits no discharge. Left eye exhibits no discharge. No scleral icterus.  Cardiovascular: Regular rhythm.  No extrasystoles are present. Tachycardia present.  Pulmonary/Chest: Effort normal and breath sounds normal. No respiratory distress. She has no wheezes. She has no rales.  Occasional barky cough  Lymphadenopathy:    She has no cervical adenopathy.  Skin: She is not diaphoretic. No pallor.  Psychiatric:  Poor historian      Assessment & Plan:   Problem List Items Addressed This Visit      Cardiovascular and Mediastinum   Hypertension    Start low dose CCB; should not bottom out BP, but prevent rising BP at dentist office; limit salt      Relevant Medications   amLODipine (NORVASC) 2.5 MG tablet    Other Visit Diagnoses    Bronchitis    -  Primary    explained that she is contagious; out of group activities [facility] for the next 2 days; PRN OTC cough/cold medicine to be discontinued when acute illness over   Relevant Orders   DG Chest 2 View   Cough       will get CXR today   Relevant Orders   DG Chest 2 View       Follow up plan: Return for follow-up visit with Dr. Sanda Klein.  An after-visit summary was printed and given to the patient at [place].  Please see the patient instructions which may contain other information and recommendations beyond what is mentioned above in the assessment and plan.  Meds ordered this encounter  Medications  . DM-APAP-CPM (VICKS NYQUIL COLD & FLU NIGHT) 30-650-4 MG/30ML LIQD    Sig: Take 15 mLs by mouth every 6 (six) hours as needed.    Dispense:  710 mL    Refill:  0  . amLODipine (NORVASC) 2.5 MG tablet    Sig: Take 1 tablet (2.5 mg total) by mouth daily.    Dispense:  90 tablet    Refill:  3  . azithromycin (ZITHROMAX) 250 MG tablet    Sig: Two pills by mouth today, then one pill daily for four more days    Dispense:  6 tablet    Refill:  0    Orders Placed This Encounter  Procedures  . DG Chest 2 View

## 2017-11-19 NOTE — Telephone Encounter (Signed)
-----   Message from Arnetha Courser, MD sent at 11/19/2017 11:54 AM EDT ----- Please let mother know that Anyjah's chest xray did not show pneumonia; we hope she'll be feeling better soon

## 2017-11-19 NOTE — Telephone Encounter (Signed)
Called mother, informed her of negative chest x-ray

## 2017-11-19 NOTE — Patient Instructions (Addendum)
Please have the xrays done when you leave here Start the antibiotics Do not let her go to [place] today and tomorrow, as she is contagious Use the over-the-counter medicine as needed Be aware that it contains acetaminophen, so do not double up on products containing acetaminophen She should have no more than 3,000 mg of acetaminophen in all medicines per 24 hours Try vitamin C (orange juice if not diabetic or vitamin C tablets) and drink green tea to help your immune system during your illness Get plenty of rest and hydration If she gets worse, please take her to urgent care or the hospital Return in one week for [employer] and blood pressure check

## 2017-11-19 NOTE — Assessment & Plan Note (Signed)
Start low dose CCB; should not bottom out BP, but prevent rising BP at dentist office; limit salt

## 2017-12-06 ENCOUNTER — Ambulatory Visit (INDEPENDENT_AMBULATORY_CARE_PROVIDER_SITE_OTHER): Payer: Medicare Other | Admitting: Family Medicine

## 2017-12-06 ENCOUNTER — Encounter: Payer: Self-pay | Admitting: Family Medicine

## 2017-12-06 VITALS — BP 132/84 | HR 103 | Temp 98.4°F | Resp 16 | Wt 153.4 lb

## 2017-12-06 DIAGNOSIS — K029 Dental caries, unspecified: Secondary | ICD-10-CM | POA: Diagnosis not present

## 2017-12-06 DIAGNOSIS — I10 Essential (primary) hypertension: Secondary | ICD-10-CM | POA: Diagnosis not present

## 2017-12-06 DIAGNOSIS — Z0110 Encounter for examination of ears and hearing without abnormal findings: Secondary | ICD-10-CM

## 2017-12-06 MED ORDER — AMLODIPINE BESYLATE 5 MG PO TABS: 5.0000 mg | ORAL_TABLET | Freq: Every day | ORAL | 0 refills | Status: DC

## 2017-12-06 NOTE — Progress Notes (Signed)
BP 132/84   Pulse (!) 103   Temp 98.4 F (36.9 C) (Oral)   Resp 16   Wt 153 lb 6.4 oz (69.6 kg)   BMI 30.21 kg/m    Subjective:    Patient ID: Tonya Lawson, female    DOB: [birth year], 57 y.o.   MRN: [id number]  HPI: Tonya Lawson is a 57 y.o. female  Chief Complaint  Patient presents with  . Follow-up  . Paperwork    [employer] olympics    HPI Patient is here with her caregiver for [employer] form; unfortuntely,mother has not filled out her part and we were unable to get in touch with her Patient is seeing dentist for cavities; unable to see her because of her blood pressure BP has been checked at the group home and it has been running high sometimes in the 867 diastolic; no added salt when cooking; some packaged foods; they do eat canned green beans and they wash it off real good before cooking  Depression screen [facility] 2/9 10/09/2017 08/29/2017 07/30/2017 04/09/2017 10/09/2016  Decreased Interest 0 0 0 0 0  Down, Depressed, Hopeless 0 0 0 0 0  PHQ - 2 Score 0 0 0 0 0    Relevant past medical, surgical, family and social history reviewed Past Medical History:  Diagnosis Date  . DNR (do not resuscitate) 08/29/2017  . Hypertension   . Kyphosis of thoracic region   . Mental retardation   . Osteoporosis    Past Surgical History:  Procedure Laterality Date  . ABDOMINAL HYSTERECTOMY     completed   Family History  Problem Relation Age of Onset  . Diabetes Mother   . Stroke Mother   . Cancer Father        brain  . Breast cancer Maternal Aunt 70  . Cancer Brother    Social History   Tobacco Use  . Smoking status: Never Smoker  . Smokeless tobacco: Never Used  Substance Use Topics  . Alcohol use: No  . Drug use: No    Interim medical history since last visit reviewed. Allergies and medications reviewed  Review of Systems Per HPI unless specifically indicated above     Objective:    BP 132/84   Pulse (!) 103   Temp 98.4 F (36.9 C) (Oral)    Resp 16   Wt 153 lb 6.4 oz (69.6 kg)   BMI 30.21 kg/m   Wt Readings from Last 3 Encounters:  12/06/17 153 lb 6.4 oz (69.6 kg)  11/19/17 153 lb 14.4 oz (69.8 kg)  10/09/17 160 lb 6.4 oz (72.8 kg)    Physical Exam  Constitutional: She appears well-developed and well-nourished.  obese  HENT:  Mouth/Throat: Mucous membranes are normal. Dental caries present.  Eyes: EOM are normal. No scleral icterus.  Cardiovascular: Normal rate and regular rhythm.  Pulmonary/Chest: Effort normal and breath sounds normal.  Psychiatric: Her mood appears anxious.  Hides her face in her hands when anxious; poor historian      Assessment & Plan:   Problem List Items Addressed This Visit      Cardiovascular and Mediastinum   Hypertension - Primary    Uncontrolled; increase amlodipine; limit sodium in diet      Relevant Medications   amLODipine (NORVASC) 5 MG tablet    Other Visit Diagnoses    Passed hearing screening       Relevant Orders   PR TYMPANOMETRY   Dental caries  urged caregiver to get patient to the dentist       Follow up plan: Return in about 1 week (around 12/13/2017) for follow-up visit with Dr. Sanda Klein.  An after-visit summary was printed and given to the patient at [place].  Please see the patient instructions which may contain other information and recommendations beyond what is mentioned above in the assessment and plan.  Meds ordered this encounter  Medications  . amLODipine (NORVASC) 5 MG tablet    Sig: Take 1 tablet (5 mg total) by mouth daily.    Dispense:  30 tablet    Refill:  0    Increasing the dose; STOP the 2.5 mg strength; cancel any 2.5 mg refills    Orders Placed This Encounter  Procedures  . PR TYMPANOMETRY

## 2017-12-06 NOTE — Patient Instructions (Addendum)
Try to follow the DASH guidelines (DASH stands for Dietary Approaches to Stop Hypertension). Try to limit the sodium in your diet to no more than 1,500mg  of sodium per day. Certainly try to not exceed 2,000 mg per day at the very most. Do not add salt when cooking or at the table.  Check the sodium amount on labels when shopping, and choose items lower in sodium when given a choice. Avoid or limit foods that already contain a lot of sodium. Eat a diet rich in fruits and vegetables and whole grains, and try to lose weight if overweight or obese  Please have mother fill out her part of the form completely, sign it, and date it and then return for the special olympics physical  Stop the amlodipine 2.5 mg Start the amlodipine 5 mg and have her take that once a day for blood pressure Check blood pressure daily and return with that form in 1-2 weeks DASH Eating Plan DASH stands for "Dietary Approaches to Stop Hypertension." The DASH eating plan is a healthy eating plan that has been shown to reduce high blood pressure (hypertension). It may also reduce your risk for type 2 diabetes, heart disease, and stroke. The DASH eating plan may also help with weight loss. What are tips for following this plan? General guidelines  Avoid eating more than 2,300 mg (milligrams) of salt (sodium) a day. If you have hypertension, you may need to reduce your sodium intake to 1,500 mg a day.  Limit alcohol intake to no more than 1 drink a day for nonpregnant women and 2 drinks a day for men. One drink equals 12 oz of beer, 5 oz of wine, or 1 oz of hard liquor.  Work with your health care provider to maintain a healthy body weight or to lose weight. Ask what an ideal weight is for you.  Get at least 30 minutes of exercise that causes your heart to beat faster (aerobic exercise) most days of the week. Activities may include walking, swimming, or biking.  Work with your health care provider or diet and nutrition specialist  (dietitian) to adjust your eating plan to your individual calorie needs. Reading food labels  Check food labels for the amount of sodium per serving. Choose foods with less than 5 percent of the Daily Value of sodium. Generally, foods with less than 300 mg of sodium per serving fit into this eating plan.  To find whole grains, look for the word "whole" as the first word in the ingredient list. Shopping  Buy products labeled as "low-sodium" or "no salt added."  Buy fresh foods. Avoid canned foods and premade or frozen meals. Cooking  Avoid adding salt when cooking. Use salt-free seasonings or herbs instead of table salt or sea salt. Check with your health care provider or pharmacist before using salt substitutes.  Do not fry foods. Cook foods using healthy methods such as baking, boiling, grilling, and broiling instead.  Cook with heart-healthy oils, such as olive, canola, soybean, or sunflower oil. Meal planning   Eat a balanced diet that includes: ? 5 or more servings of fruits and vegetables each day. At each meal, try to fill half of your plate with fruits and vegetables. ? Up to 6-8 servings of whole grains each day. ? Less than 6 oz of lean meat, poultry, or fish each day. A 3-oz serving of meat is about the same size as a deck of cards. One egg equals 1 oz. ? 2 servings  of low-fat dairy each day. ? A serving of nuts, seeds, or beans 5 times each week. ? Heart-healthy fats. Healthy fats called Omega-3 fatty acids are found in foods such as flaxseeds and coldwater fish, like sardines, salmon, and mackerel.  Limit how much you eat of the following: ? Canned or prepackaged foods. ? Food that is high in trans fat, such as fried foods. ? Food that is high in saturated fat, such as fatty meat. ? Sweets, desserts, sugary drinks, and other foods with added sugar. ? Full-fat dairy products.  Do not salt foods before eating.  Try to eat at least 2 vegetarian meals each week.  Eat  more home-cooked food and less restaurant, buffet, and fast food.  When eating at a restaurant, ask that your food be prepared with less salt or no salt, if possible. What foods are recommended? The items listed may not be a complete list. Talk with your dietitian about what dietary choices are best for you. Grains Whole-grain or whole-wheat bread. Whole-grain or whole-wheat pasta. Brown rice. [place]. Bulgur. Whole-grain and low-sodium cereals. Pita bread. Low-fat, low-sodium crackers. Whole-wheat flour tortillas. Vegetables Fresh or frozen vegetables (raw, steamed, roasted, or grilled). Low-sodium or reduced-sodium tomato and vegetable juice. Low-sodium or reduced-sodium tomato sauce and tomato paste. Low-sodium or reduced-sodium canned vegetables. Fruits All fresh, dried, or frozen fruit. Canned fruit in natural juice (without added sugar). Meat and other protein foods Skinless chicken or [place]. Ground chicken or [place]. Pork with fat trimmed off. Fish and seafood. Egg whites. Dried beans, peas, or lentils. Unsalted nuts, nut butters, and seeds. Unsalted canned beans. Lean cuts of beef with fat trimmed off. Low-sodium, lean deli meat. Dairy Low-fat (1%) or fat-free (skim) milk. Fat-free, low-fat, or reduced-fat cheeses. Nonfat, low-sodium ricotta or cottage cheese. Low-fat or nonfat yogurt. Low-fat, low-sodium cheese. Fats and oils Soft margarine without trans fats. Vegetable oil. Low-fat, reduced-fat, or light mayonnaise and salad dressings (reduced-sodium). Canola, safflower, olive, soybean, and sunflower oils. Avocado. Seasoning and other foods Herbs. Spices. Seasoning mixes without salt. Unsalted popcorn and pretzels. Fat-free sweets. What foods are not recommended? The items listed may not be a complete list. Talk with your dietitian about what dietary choices are best for you. Grains Baked goods made with fat, such as croissants, muffins, or some breads. Dry pasta or rice meal  packs. Vegetables Creamed or fried vegetables. Vegetables in a cheese sauce. Regular canned vegetables (not low-sodium or reduced-sodium). Regular canned tomato sauce and paste (not low-sodium or reduced-sodium). Regular tomato and vegetable juice (not low-sodium or reduced-sodium). Angie Fava. Olives. Fruits Canned fruit in a light or heavy syrup. Fried fruit. Fruit in cream or butter sauce. Meat and other protein foods Fatty cuts of meat. Ribs. Fried meat. Berniece Salines. Sausage. Bologna and other processed lunch meats. Salami. Fatback. Hotdogs. Bratwurst. Salted nuts and seeds. Canned beans with added salt. Canned or smoked fish. Whole eggs or egg yolks. Chicken or [place] with skin. Dairy Whole or 2% milk, cream, and half-and-half. Whole or full-fat cream cheese. Whole-fat or sweetened yogurt. Full-fat cheese. Nondairy creamers. Whipped toppings. Processed cheese and cheese spreads. Fats and oils Butter. Stick margarine. Lard. Shortening. Ghee. Bacon fat. Tropical oils, such as coconut, palm kernel, or palm oil. Seasoning and other foods Salted popcorn and pretzels. Onion salt, garlic salt, seasoned salt, table salt, and sea salt. Worcestershire sauce. Tartar sauce. Barbecue sauce. Teriyaki sauce. Soy sauce, including reduced-sodium. Steak sauce. Canned and packaged gravies. Fish sauce. Oyster sauce. Cocktail sauce. Horseradish that  you find on the shelf. Ketchup. Mustard. Meat flavorings and tenderizers. Bouillon cubes. Hot sauce and Tabasco sauce. Premade or packaged marinades. Premade or packaged taco seasonings. Relishes. Regular salad dressings. Where to find more information:  National Heart, Lung, and [place]: https://wilson-eaton.com/  American Heart Association: www.heart.org Summary  The DASH eating plan is a healthy eating plan that has been shown to reduce high blood pressure (hypertension). It may also reduce your risk for type 2 diabetes, heart disease, and stroke.  With the DASH eating  plan, you should limit salt (sodium) intake to 2,300 mg a day. If you have hypertension, you may need to reduce your sodium intake to 1,500 mg a day.  When on the DASH eating plan, aim to eat more fresh fruits and vegetables, whole grains, lean proteins, low-fat dairy, and heart-healthy fats.  Work with your health care provider or diet and nutrition specialist (dietitian) to adjust your eating plan to your individual calorie needs. This information is not intended to replace advice given to you by your health care provider. Make sure you discuss any questions you have with your health care provider. Document Released: 08/17/2011 Document Revised: 08/21/2016 Document Reviewed: 08/21/2016 Elsevier Interactive Patient Education  [employer].

## 2017-12-10 NOTE — Assessment & Plan Note (Signed)
Uncontrolled; increase amlodipine; limit sodium in diet

## 2017-12-13 ENCOUNTER — Encounter: Payer: Self-pay | Admitting: Family Medicine

## 2017-12-13 ENCOUNTER — Ambulatory Visit (INDEPENDENT_AMBULATORY_CARE_PROVIDER_SITE_OTHER): Payer: Medicare Other | Admitting: Family Medicine

## 2017-12-13 VITALS — BP 132/86 | HR 99 | Temp 97.9°F | Resp 14 | Ht 60.0 in | Wt 151.7 lb

## 2017-12-13 DIAGNOSIS — I10 Essential (primary) hypertension: Secondary | ICD-10-CM

## 2017-12-13 DIAGNOSIS — E66811 Obesity, class 1: Secondary | ICD-10-CM

## 2017-12-13 DIAGNOSIS — M810 Age-related osteoporosis without current pathological fracture: Secondary | ICD-10-CM | POA: Diagnosis not present

## 2017-12-13 DIAGNOSIS — F79 Unspecified intellectual disabilities: Secondary | ICD-10-CM

## 2017-12-13 DIAGNOSIS — E669 Obesity, unspecified: Secondary | ICD-10-CM | POA: Diagnosis not present

## 2017-12-13 DIAGNOSIS — Z23 Encounter for immunization: Secondary | ICD-10-CM | POA: Diagnosis not present

## 2017-12-13 NOTE — Assessment & Plan Note (Signed)
Continue fosamax; keep appt with dentist

## 2017-12-13 NOTE — Assessment & Plan Note (Signed)
Work on just five pounds of weight loss over the next month or two; staff will try to help her

## 2017-12-13 NOTE — Progress Notes (Signed)
BP 132/86   Pulse 99   Temp 97.9 F (36.6 C) (Oral)   Resp 14   Ht 5' (1.524 m)   Wt 151 lb 11.2 oz (68.8 kg)   SpO2 96%   BMI 29.63 kg/m    Subjective:    Patient ID: Tonya Lawson, female    DOB: Sep 15, 1960, 57 y.o.   MRN: [id number]  HPI: Tonya Lawson is a 57 y.o. female  Chief Complaint  Patient presents with  . Follow-up    1 week  . Hypertension  . Paperwork    Special olympics form    HPI Patient is here for follow-up She has high blood pressure; medicine was adjusted last visit; BP is controlled today  She is here for a Special Olympics physical; see form; reviewed answers with caregiver today  They will be getting her back to the dentist now that her BP is better controlled  On fosamax for osteoporosis  Obesity; she likes pudding; staff will try to give her sugar-free pudding  Depression screen [facility] 2/9 12/13/2017 10/09/2017 08/29/2017 07/30/2017 04/09/2017  Decreased Interest 0 0 0 0 0  Down, Depressed, Hopeless 0 0 0 0 0  PHQ - 2 Score 0 0 0 0 0    Relevant past medical, surgical, family and social history reviewed Past Medical History:  Diagnosis Date  . DNR (do not resuscitate) 08/29/2017  . Hypertension   . Kyphosis of thoracic region   . Mental retardation   . Osteoporosis    Past Surgical History:  Procedure Laterality Date  . ABDOMINAL HYSTERECTOMY     completed   Family History  Problem Relation Age of Onset  . Diabetes Mother   . Stroke Mother   . Cancer Father        brain  . Breast cancer Maternal Aunt 70  . Cancer Brother    Social History   Tobacco Use  . Smoking status: Never Smoker  . Smokeless tobacco: Never Used  Substance Use Topics  . Alcohol use: No  . Drug use: No    Interim medical history since last visit reviewed. Allergies and medications reviewed  Review of Systems Per HPI unless specifically indicated above     Objective:    BP 132/86   Pulse 99   Temp 97.9 F (36.6 C) (Oral)   Resp 14    Ht 5' (1.524 m)   Wt 151 lb 11.2 oz (68.8 kg)   SpO2 96%   BMI 29.63 kg/m   Wt Readings from Last 3 Encounters:  12/13/17 151 lb 11.2 oz (68.8 kg)  12/06/17 153 lb 6.4 oz (69.6 kg)  11/19/17 153 lb 14.4 oz (69.8 kg)    Physical Exam  Constitutional: She appears well-developed and well-nourished.  HENT:  Head: Normocephalic and atraumatic.  Right Ear: External ear normal.  Left Ear: External ear normal.  Nose: Nose normal.  Mouth/Throat: Oropharynx is clear and moist and mucous membranes are normal.  Eyes: EOM are normal. No scleral icterus.  Neck: No thyromegaly present.  Cardiovascular: Normal rate and regular rhythm.  Pulmonary/Chest: Effort normal and breath sounds normal.  Abdominal: Bowel sounds are normal. She exhibits no distension.  Musculoskeletal: Normal range of motion. She exhibits no edema.  Neurological: She is alert.  Reflex Scores:      Patellar reflexes are 2+ on the right side and 2+ on the left side. No gross focal motor deficts; no facial asymmetry  Skin: No pallor.  Psychiatric: She  has a normal mood and affect. Her behavior is normal. Her mood appears not anxious. Cognition and memory are impaired. She does not exhibit a depressed mood.  Poor eye contact with examiner; hides her face in her hands when excited; poor historian    Results for orders placed or performed in visit on 08/29/17  HIV antibody  Result Value Ref Range   HIV 1&2 Ab, 4th Generation NON-REACTIVE NON-REACTI  Hepatitis C antibody  Result Value Ref Range   Hepatitis C Ab NON-REACTIVE NON-REACTI   SIGNAL TO CUT-OFF 0.02 <1.00  COMPLETE METABOLIC PANEL WITH GFR  Result Value Ref Range   Glucose, Bld 90 65 - 99 mg/dL   BUN 15 7 - 25 mg/dL   Creat 0.93 0.50 - 1.05 mg/dL   GFR, Est Non African American 69 > OR = 60 mL/min/1.61m2   GFR, Est African American 80 > OR = 60 mL/min/1.65m2   BUN/Creatinine Ratio NOT APPLICABLE 6 - 22 (calc)   Sodium 142 135 - 146 mmol/L   Potassium 4.3  3.5 - 5.3 mmol/L   Chloride 107 98 - 110 mmol/L   CO2 28 20 - 32 mmol/L   Calcium 10.0 8.6 - 10.4 mg/dL   Total Protein 6.8 6.1 - 8.1 g/dL   Albumin 4.2 3.6 - 5.1 g/dL   Globulin 2.6 1.9 - 3.7 g/dL (calc)   AG Ratio 1.6 1.0 - 2.5 (calc)   Total Bilirubin 0.4 0.2 - 1.2 mg/dL   Alkaline phosphatase (APISO) 92 33 - 130 U/L   AST 15 10 - 35 U/L   ALT 11 6 - 29 U/L  Lipid panel  Result Value Ref Range   Cholesterol 153 <200 mg/dL   HDL 57 >50 mg/dL   Triglycerides 140 <150 mg/dL   LDL Cholesterol (Calc) 74 mg/dL (calc)   Total CHOL/HDL Ratio 2.7 <5.0 (calc)   Non-HDL Cholesterol (Calc) 96 <130 mg/dL (calc)  VITAMIN D 25 Hydroxy (Vit-D Deficiency, Fractures)  Result Value Ref Range   Vit D, 25-Hydroxy 45 30 - 100 ng/mL      Assessment & Plan:   Problem List Items Addressed This Visit      Cardiovascular and Mediastinum   Hypertension    Caregiver staff to work with her on weight loss, as even five pounds may help; continue medicine        Musculoskeletal and Integument   Osteoporosis    Continue fosamax; keep appt with dentist        Other   Obesity (BMI 30.0-34.9)    Work on just five pounds of weight loss over the next month or two; staff will try to help her      Intellectual disability - Primary    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  An after-visit summary was printed and given to the patient at [place].  Please see the patient instructions which may contain other information and recommendations beyond what is mentioned above in the assessment and plan.  No orders of the defined types were placed in this encounter.   Orders Placed This Encounter  Procedures  .  Tdap vaccine greater than or equal  to 7yo IM

## 2017-12-13 NOTE — Assessment & Plan Note (Signed)
Caregiver staff to work with her on weight loss, as even five pounds may help; continue medicine

## 2017-12-13 NOTE — Assessment & Plan Note (Signed)
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

## 2017-12-13 NOTE — Patient Instructions (Addendum)
Please do see the dentist now that your blood pressure is controlled Try to lose five pounds over the next few months More activity, less calories More water, 48-64 ounces a day No Kool-Aid Keep appointment in July

## 2017-12-17 ENCOUNTER — Other Ambulatory Visit: Payer: Medicare Other

## 2017-12-19 ENCOUNTER — Ambulatory Visit
Admission: RE | Admit: 2017-12-19 | Discharge: 2017-12-19 | Disposition: A | Discharge status: Home / Self Care | Payer: Medicare Other | Source: Ambulatory Visit | Attending: Family Medicine | Admitting: Family Medicine

## 2017-12-19 DIAGNOSIS — M810 Age-related osteoporosis without current pathological fracture: Secondary | ICD-10-CM | POA: Diagnosis not present

## 2017-12-19 DIAGNOSIS — Z780 Asymptomatic menopausal state: Secondary | ICD-10-CM | POA: Diagnosis not present

## 2018-01-02 ENCOUNTER — Other Ambulatory Visit: Payer: Self-pay | Admitting: Family Medicine

## 2018-01-03 ENCOUNTER — Other Ambulatory Visit: Payer: Self-pay

## 2018-01-03 MED ORDER — CRANBERRY-VITAMIN C-VITAMIN E 4200-20-3 MG-MG-UNIT PO CAPS: 1.0000 | ORAL_CAPSULE | Freq: Every day | ORAL | 11 refills | Status: DC

## 2018-01-15 ENCOUNTER — Telehealth: Payer: Self-pay | Admitting: Family Medicine

## 2018-01-15 MED ORDER — LORATADINE 10 MG PO TABS: 10.0000 mg | ORAL_TABLET | Freq: Every day | ORAL | 11 refills | Status: DC | PRN

## 2018-01-15 NOTE — Telephone Encounter (Signed)
Copied from [place] [id number]. Topic: General - Other >> Jan 15, 2018  8:41 AM Tashae Stare wrote:  Pt care giver call to ask if Dr Sanda Klein will call her in something for allergies or does she need an appt  Curlene Dolphin is care giver and can call her [facility]

## 2018-01-15 NOTE — Telephone Encounter (Signed)
Start new RX: Meds ordered this encounter  Medications  . loratadine (CLARITIN) 10 MG tablet    Sig: Take 1 tablet (10 mg total) by mouth daily as needed for allergies.    Dispense:  30 tablet    Refill:  11

## 2018-01-18 ENCOUNTER — Telehealth: Payer: Self-pay | Admitting: Family Medicine

## 2018-01-18 DIAGNOSIS — L02811 Cutaneous abscess of head [any part, except face]: Secondary | ICD-10-CM | POA: Diagnosis not present

## 2018-01-18 NOTE — Telephone Encounter (Signed)
Copied from [place] [id number]. Topic: General - Other >> Jan 18, 2018  1:46 PM Margot Ables wrote: Reason for CRM: Pt has a cut on her ear that is red and swollen. They have been using neosporin. It appears infected. This has been present since Sunday 01/13/18. Initially it was a scab but now is swollen and irritated. Is there something that can be sent in for the patient.  [place], [place] [address] [phone] (Phone) [phone] (Fax)

## 2018-01-18 NOTE — Telephone Encounter (Signed)
Pt caregiver faye notified

## 2018-01-18 NOTE — Telephone Encounter (Signed)
Patient needs to be evaluated; she needs to go to urgent care Thank you

## 2018-01-21 ENCOUNTER — Telehealth: Payer: Self-pay

## 2018-01-21 NOTE — Telephone Encounter (Signed)
Copied from [place] [id number]. Topic: Inquiry >> Jan 21, 2018 11:58 AM Arletha Grippe wrote: Reason for CRM: kizzy at [facility] called - she needs authorization for pt being seen Friday evening.  Please call [phone]   Please advise if it is ok to give authorization and review notes in care of everywhere.

## 2018-01-21 NOTE — Telephone Encounter (Signed)
That's fine; yes, thank you

## 2018-01-22 NOTE — Telephone Encounter (Signed)
Per Dr. Enid Derry, I returned Kizzy's call and authorization was given as requested.  She said thanks

## 2018-01-25 ENCOUNTER — Ambulatory Visit: Payer: Medicare Other | Admitting: Family Medicine

## 2018-02-01 ENCOUNTER — Ambulatory Visit (INDEPENDENT_AMBULATORY_CARE_PROVIDER_SITE_OTHER): Payer: Medicare Other | Admitting: Family Medicine

## 2018-02-01 ENCOUNTER — Encounter: Payer: Self-pay | Admitting: Family Medicine

## 2018-02-01 ENCOUNTER — Other Ambulatory Visit: Payer: Self-pay | Admitting: Family Medicine

## 2018-02-01 VITALS — BP 134/82 | HR 99 | Temp 97.4°F | Resp 12 | Ht 60.0 in | Wt 152.7 lb

## 2018-02-01 DIAGNOSIS — E663 Overweight: Secondary | ICD-10-CM | POA: Diagnosis not present

## 2018-02-01 DIAGNOSIS — L03811 Cellulitis of head [any part, except face]: Secondary | ICD-10-CM | POA: Diagnosis not present

## 2018-02-01 DIAGNOSIS — R600 Localized edema: Secondary | ICD-10-CM

## 2018-02-01 NOTE — Assessment & Plan Note (Signed)
Try to lose 10 pounds over the next 4 months

## 2018-02-01 NOTE — Patient Instructions (Addendum)
Get her walking, try to walk 10 minutes a day five days a week, build up to 20 minutes gradually Keep an eye on that area behind the ear and call if needed Check out the information at familydoctor.org entitled "Nutrition for Weight Loss: What You Need to Know about Fad Diets" Try to lose between 1-2 pounds per week by taking in fewer calories and burning off more calories You can succeed by limiting portions, limiting foods dense in calories and fat, becoming more active, and drinking 8 glasses of water a day (64 ounces) Don't skip meals, especially breakfast, as skipping meals may alter your metabolism Do not use over-the-counter weight loss pills or gimmicks that claim rapid weight loss A healthy BMI (or body mass index) is between 18.5 and 24.9 You can calculate your ideal BMI at the [place] website ClubMonetize.fr Try to lose 10 pounds over the next 4 months

## 2018-02-01 NOTE — Progress Notes (Signed)
BP 134/82   Pulse 99   Temp (!) 97.4 F (36.3 C) (Oral)   Resp 12   Ht 5' (1.524 m)   Wt 152 lb 11.2 oz (69.3 kg)   SpO2 99%   BMI 29.82 kg/m    Subjective:    Patient ID: Tonya Lawson, female    DOB: 10/24/1960, 57 y.o.   MRN: [id number]  HPI: Tonya Lawson is a 57 y.o. female  Chief Complaint  Patient presents with  . Edema    swelling in legs, wants to see about getting ted hose  . Skin Problem    right side of head    HPI Patient is here for acute visit; caregiver from group home is here and provides history She went to the urgent care for an external ear infection; they called here and were told to go to urgent care; they stated antibiotics; oozed on its own; she scratched it and got it infected; skin problem on the right side of the head  She is having swelling in her legs; "she has big legs anyway" says group home caregiver; no redness; sits in her chair most of the day and watches TV; does not exercise  She is going to have her wisdom tooth pulled out and they are going to do a deep cleaning  Depression screen [facility] 2/9 02/01/2018 12/13/2017 10/09/2017 08/29/2017 07/30/2017  Decreased Interest 0 0 0 0 0  Down, Depressed, Hopeless 0 0 0 0 0  PHQ - 2 Score 0 0 0 0 0    Relevant past medical, surgical, family and social history reviewed Past Medical History:  Diagnosis Date  . DNR (do not resuscitate) 08/29/2017  . Hypertension   . Kyphosis of thoracic region   . Mental retardation   . Osteoporosis    Past Surgical History:  Procedure Laterality Date  . ABDOMINAL HYSTERECTOMY     completed   Family History  Problem Relation Age of Onset  . Diabetes Mother   . Stroke Mother   . Cancer Father        brain  . Breast cancer Maternal Aunt 70  . Cancer Brother    Social History   Tobacco Use  . Smoking status: Never Smoker  . Smokeless tobacco: Never Used  Substance Use Topics  . Alcohol use: No  . Drug use: No    Interim medical history  since last visit reviewed. Allergies and medications reviewed  Review of Systems Per HPI unless specifically indicated above     Objective:    BP 134/82   Pulse 99   Temp (!) 97.4 F (36.3 C) (Oral)   Resp 12   Ht 5' (1.524 m)   Wt 152 lb 11.2 oz (69.3 kg)   SpO2 99%   BMI 29.82 kg/m   Wt Readings from Last 3 Encounters:  02/01/18 152 lb 11.2 oz (69.3 kg)  12/13/17 151 lb 11.2 oz (68.8 kg)  12/06/17 153 lb 6.4 oz (69.6 kg)    Physical Exam  Constitutional: She appears well-developed and well-nourished. No distress.  HENT:  Head: Normocephalic and atraumatic.    Healing area of prior cellulitis and abscess; no active drainage; mild pinkness, but not erythematous; no fluctuance; localized area of scalp has been shaved with good visualization of entire area  Eyes: No scleral icterus.  Neck: No thyromegaly present.  Cardiovascular: Normal rate, regular rhythm and normal heart sounds.  No murmur heard. Pulmonary/Chest: Effort normal and breath sounds normal.  Abdominal: Soft. She exhibits no distension. There is no tenderness.  Musculoskeletal: Normal range of motion. She exhibits no edema (lipidemia noted, but no pitting edema; no erythema; legs soft and symmetric).  Lymphadenopathy:    She has no cervical adenopathy.  Neurological: She is alert. She exhibits normal muscle tone.  Skin: Skin is warm and dry. She is not diaphoretic. No pallor.  Psychiatric: Cognition and memory are impaired.  Rubs her face and eyes as a nervous tic as before; very shy; poor historian       Assessment & Plan:   Problem List Items Addressed This Visit      Other   Overweight (BMI 25.0-29.9)    Try to lose 10 pounds over the next 4 months       Other Visit Diagnoses    Bilateral leg edema    -  Primary   due to lipedema; no signs of DVT, CHF; encouraged walking and weight loss   Abscess or cellulitis of scalp       resolving; no current s/s of infection; completed TMP/SMX; watch  area; notify if recurs       Follow up plan: No follow-ups on file.  An after-visit summary was printed and given to the patient at [place].  Please see the patient instructions which may contain other information and recommendations beyond what is mentioned above in the assessment and plan.  No orders of the defined types were placed in this encounter.   No orders of the defined types were placed in this encounter.

## 2018-04-09 ENCOUNTER — Ambulatory Visit (INDEPENDENT_AMBULATORY_CARE_PROVIDER_SITE_OTHER): Payer: Medicare Other | Admitting: Family Medicine

## 2018-04-09 ENCOUNTER — Encounter: Payer: Self-pay | Admitting: Family Medicine

## 2018-04-09 DIAGNOSIS — Z5181 Encounter for therapeutic drug level monitoring: Secondary | ICD-10-CM

## 2018-04-09 DIAGNOSIS — I10 Essential (primary) hypertension: Secondary | ICD-10-CM

## 2018-04-09 DIAGNOSIS — E663 Overweight: Secondary | ICD-10-CM

## 2018-04-09 DIAGNOSIS — M810 Age-related osteoporosis without current pathological fracture: Secondary | ICD-10-CM

## 2018-04-09 DIAGNOSIS — E7800 Pure hypercholesterolemia, unspecified: Secondary | ICD-10-CM

## 2018-04-09 LAB — COMPLETE METABOLIC PANEL WITH GFR
AG Ratio: 1.6 (calc) (ref 1.0–2.5)
ALBUMIN MSPROF: 4.4 g/dL (ref 3.6–5.1)
ALT: 13 U/L (ref 6–29)
AST: 23 U/L (ref 10–35)
Alkaline phosphatase (APISO): 71 U/L (ref 33–130)
BUN / CREAT RATIO: 9 (calc) (ref 6–22)
BUN: 10 mg/dL (ref 7–25)
CALCIUM: 10.7 mg/dL — AB (ref 8.6–10.4)
CHLORIDE: 101 mmol/L (ref 98–110)
CO2: 29 mmol/L (ref 20–32)
CREATININE: 1.09 mg/dL — AB (ref 0.50–1.05)
GFR, EST AFRICAN AMERICAN: 66 mL/min/{1.73_m2} (ref >=60)
GFR, EST NON AFRICAN AMERICAN: 57 mL/min/{1.73_m2} — AB (ref >=60)
GLUCOSE: 79 mg/dL (ref 65–139)
Globulin: 2.7 g/dL (calc) (ref 1.9–3.7)
Potassium: 4.4 mmol/L (ref 3.5–5.3)
Sodium: 140 mmol/L (ref 135–146)
TOTAL PROTEIN: 7.1 g/dL (ref 6.1–8.1)
Total Bilirubin: 0.6 mg/dL (ref 0.2–1.2)

## 2018-04-09 LAB — LIPID PANEL
CHOLESTEROL: 165 mg/dL (ref <200)
HDL: 60 mg/dL (ref >50)
LDL Cholesterol (Calc): 77 mg/dL (calc)
NON-HDL CHOLESTEROL (CALC): 105 mg/dL (ref <130)
Total CHOL/HDL Ratio: 2.8 (calc) (ref <5.0)
Triglycerides: 188 mg/dL — ABNORMAL HIGH (ref <150)

## 2018-04-09 NOTE — Assessment & Plan Note (Signed)
Well-controlled; avoid salt

## 2018-04-09 NOTE — Progress Notes (Signed)
BP 128/80   Pulse 100   Temp 98.4 F (36.9 C) (Oral)   Resp 12   Ht 5' (1.524 m)   Wt 152 lb 12.8 oz (69.3 kg)   SpO2 98%   BMI 29.84 kg/m    Subjective:    Patient ID: Tonya Lawson, female    DOB: 29-Jan-1961, 57 y.o.   MRN: [id number]  HPI: Tonya Lawson is a 57 y.o. female  Chief Complaint  Patient presents with  . Hypertension  . Osteoporosis    HPI Patient is here for six month f/u Patient is here with mother- Joaquim Lai and caregiver from [employer]. Living in group home, requires 24/7 care. Had wisdom tooth pulled last friday, Finished antibiotic and is taking pain pill, bruising noted to lower face. Dentist didn't have any further concerns of teeth-states just try hard to brush it; patient doesn't like to get teeth brushed.   Osteoporosis on oral bisphosphonate and no concerns from the dentist States pain in teeth doesn't affect appetite- good appetite, good energy.   High blood pressure; on amlodipine; no added salt to the food  High cholesterol; on statin; hot dogs maybe once every two weeks; cheeseburger or hamburger once a week; at out to eat, [facility] or [facility] Lab Results  Component Value Date   CHOL 165 04/09/2018   HDL 60 04/09/2018   LDLCALC 77 04/09/2018   TRIG 188 (H) 04/09/2018   CHOLHDL 2.8 04/09/2018     Depression screen PHQ 2/9 04/09/2018 02/01/2018 12/13/2017 10/09/2017 08/29/2017  Decreased Interest 0 0 0 0 0  Down, Depressed, Hopeless 0 0 0 0 0  PHQ - 2 Score 0 0 0 0 0    Relevant past medical, surgical, family and social history reviewed Past Medical History:  Diagnosis Date  . DNR (do not resuscitate) 08/29/2017  . Hypertension   . Kyphosis of thoracic region   . Mental retardation   . Osteoporosis    Past Surgical History:  Procedure Laterality Date  . ABDOMINAL HYSTERECTOMY     completed   Family History  Problem Relation Age of Onset  . Diabetes Mother   . Stroke Mother   . Cancer Father        brain  .  Breast cancer Maternal Aunt 70  . Cancer Brother    Social History   Tobacco Use  . Smoking status: Never Smoker  . Smokeless tobacco: Never Used  Substance Use Topics  . Alcohol use: No  . Drug use: No    Interim medical history since last visit reviewed. Allergies and medications reviewed  Review of Systems Per HPI unless specifically indicated above     Objective:    BP 128/80   Pulse 100   Temp 98.4 F (36.9 C) (Oral)   Resp 12   Ht 5' (1.524 m)   Wt 152 lb 12.8 oz (69.3 kg)   SpO2 98%   BMI 29.84 kg/m   Wt Readings from Last 3 Encounters:  04/09/18 152 lb 12.8 oz (69.3 kg)  02/01/18 152 lb 11.2 oz (69.3 kg)  12/13/17 151 lb 11.2 oz (68.8 kg)    Physical Exam  Constitutional: She appears well-developed and well-nourished. No distress.  overweight  HENT:  Head: Normocephalic and atraumatic.  Eyes: EOM are normal. No scleral icterus.  Neck: No thyromegaly present.  Cardiovascular: Normal rate, regular rhythm and normal heart sounds.  No murmur heard. Pulmonary/Chest: Effort normal and breath sounds normal. No respiratory distress.  She has no wheezes.  Abdominal: Soft. Bowel sounds are normal. She exhibits no distension.  Musculoskeletal: Normal range of motion. She exhibits no edema.  Neurological: She is alert. She exhibits normal muscle tone.  Skin: Skin is warm and dry. She is not diaphoretic. No pallor.  Bruising under the left jaw (c/w recent dental extraction)  Psychiatric: She has a normal mood and affect. Her behavior is normal. Judgment and thought content normal. She is not aggressive, not hyperactive and not combative.  Fair eye contact; very limited historian      Assessment & Plan:   Problem List Items Addressed This Visit      Cardiovascular and Mediastinum   Hypertension (Chronic)    Well-controlled; avoid salt      Relevant Orders   COMPLETE METABOLIC PANEL WITH GFR (Completed)     Musculoskeletal and Integument   Osteoporosis  (Chronic)    Continue fosamax; fall precautions        Other   Overweight (BMI 25.0-29.9)    Encouraged ten pounds of weight loss      Medication monitoring encounter    Check liver and kidneys      Relevant Orders   COMPLETE METABOLIC PANEL WITH GFR (Completed)   Elevated cholesterol    Continue statin; limit fatty meats      Relevant Orders   Lipid panel (Completed)       Follow up plan: Return in about 6 months (around 10/10/2018) for follow-up visit with Dr. Sanda Klein.  An after-visit summary was printed and given to the patient at [place].  Please see the patient instructions which may contain other information and recommendations beyond what is mentioned above in the assessment and plan.  No orders of the defined types were placed in this encounter.   Orders Placed This Encounter  Procedures  . COMPLETE METABOLIC PANEL WITH GFR  . Lipid panel

## 2018-04-09 NOTE — Patient Instructions (Addendum)
Check out the information at familydoctor.org entitled "Nutrition for Weight Loss: What You Need to Know about Fad Diets" Try to lose between 1 pound per week by taking in fewer calories and burning off more calories You can succeed by limiting portions, limiting foods dense in calories and fat, becoming more active, and drinking 8 glasses of water a day (64 ounces) Don't skip meals, especially breakfast, as skipping meals may alter your metabolism Do not use over-the-counter weight loss pills or gimmicks that claim rapid weight loss A healthy BMI (or body mass index) is between 18.5 and 24.9 You can calculate your ideal BMI at the [place] website ClubMonetize.fr  Try to limit saturated fats in your diet (bologna, hot dogs, barbeque, cheeseburgers, hamburgers, steak, bacon, sausage, cheese, etc.) and get more fresh fruits, vegetables, and whole grains   Fall Prevention in the Home Falls can cause injuries. They can happen to people of all ages. There are many things you can do to make your home safe and to help prevent falls. What can I do on the outside of my home?  Regularly fix the edges of walkways and driveways and fix any cracks.  Remove anything that might make you trip as you walk through a door, such as a raised step or threshold.  Trim any bushes or trees on the path to your home.  Use bright outdoor lighting.  Clear any walking paths of anything that might make someone trip, such as rocks or tools.  Regularly check to see if handrails are loose or broken. Make sure that both sides of any steps have handrails.  Any raised decks and porches should have guardrails on the edges.  Have any leaves, snow, or ice cleared regularly.  Use sand or salt on walking paths during winter.  Clean up any spills in your garage right away. This includes oil or grease spills. What can I do in the bathroom?  Use night lights.  Install grab  bars by the toilet and in the tub and shower. Do not use towel bars as grab bars.  Use non-skid mats or decals in the tub or shower.  If you need to sit down in the shower, use a plastic, non-slip stool.  Keep the floor dry. Clean up any water that spills on the floor as soon as it happens.  Remove soap buildup in the tub or shower regularly.  Attach bath mats securely with double-sided non-slip rug tape.  Do not have throw rugs and other things on the floor that can make you trip. What can I do in the bedroom?  Use night lights.  Make sure that you have a light by your bed that is easy to reach.  Do not use any sheets or blankets that are too big for your bed. They should not hang down onto the floor.  Have a firm chair that has side arms. You can use this for support while you get dressed.  Do not have throw rugs and other things on the floor that can make you trip. What can I do in the kitchen?  Clean up any spills right away.  Avoid walking on wet floors.  Keep items that you use a lot in easy-to-reach places.  If you need to reach something above you, use a strong step stool that has a grab bar.  Keep electrical cords out of the way.  Do not use floor polish or wax that makes floors slippery. If you must use wax, use  non-skid floor wax.  Do not have throw rugs and other things on the floor that can make you trip. What can I do with my stairs?  Do not leave any items on the stairs.  Make sure that there are handrails on both sides of the stairs and use them. Fix handrails that are broken or loose. Make sure that handrails are as long as the stairways.  Check any carpeting to make sure that it is firmly attached to the stairs. Fix any carpet that is loose or worn.  Avoid having throw rugs at the top or bottom of the stairs. If you do have throw rugs, attach them to the floor with carpet tape.  Make sure that you have a light switch at the top of the stairs and the  bottom of the stairs. If you do not have them, ask someone to add them for you. What else can I do to help prevent falls?  Wear shoes that: ? Do not have high heels. ? Have rubber bottoms. ? Are comfortable and fit you well. ? Are closed at the toe. Do not wear sandals.  If you use a stepladder: ? Make sure that it is fully opened. Do not climb a closed stepladder. ? Make sure that both sides of the stepladder are locked into place. ? Ask someone to hold it for you, if possible.  Clearly mark and make sure that you can see: ? Any grab bars or handrails. ? First and last steps. ? Where the edge of each step is.  Use tools that help you move around (mobility aids) if they are needed. These include: ? Canes. ? Walkers. ? Scooters. ? Crutches.  Turn on the lights when you go into a dark area. Replace any light bulbs as soon as they burn out.  Set up your furniture so you have a clear path. Avoid moving your furniture around.  If any of your floors are uneven, fix them.  If there are any pets around you, be aware of where they are.  Review your medicines with your doctor. Some medicines can make you feel dizzy. This can increase your chance of falling. Ask your doctor what other things that you can do to help prevent falls. This information is not intended to replace advice given to you by your health care provider. Make sure you discuss any questions you have with your health care provider. Document Released: 06/24/2009 Document Revised: 02/03/2016 Document Reviewed: 10/02/2014 Elsevier Interactive Patient Education  [employer].

## 2018-04-09 NOTE — Assessment & Plan Note (Signed)
Continue fosamax; fall precautions

## 2018-04-09 NOTE — Assessment & Plan Note (Signed)
Continue statin; limit fatty meats

## 2018-04-09 NOTE — Assessment & Plan Note (Signed)
Check liver and kidneys 

## 2018-04-09 NOTE — Assessment & Plan Note (Signed)
Encouraged ten pounds of weight loss

## 2018-04-22 ENCOUNTER — Encounter: Payer: Self-pay | Admitting: Family Medicine

## 2018-04-22 ENCOUNTER — Telehealth: Payer: Self-pay | Admitting: Family Medicine

## 2018-04-22 NOTE — Telephone Encounter (Signed)
Mildly high Ca2+; on fosamax; no thiazide Return for recheck labs in the next week (BMP, vit D, phos) Letter sent

## 2018-05-03 ENCOUNTER — Other Ambulatory Visit: Payer: Self-pay

## 2018-05-04 LAB — BASIC METABOLIC PANEL
BUN: 13 mg/dL (ref 7–25)
CO2: 28 mmol/L (ref 20–32)
CREATININE: 0.91 mg/dL (ref 0.50–1.05)
Calcium: 9.7 mg/dL (ref 8.6–10.4)
Chloride: 101 mmol/L (ref 98–110)
GLUCOSE: 90 mg/dL (ref 65–99)
Potassium: 4.4 mmol/L (ref 3.5–5.3)
SODIUM: 138 mmol/L (ref 135–146)

## 2018-05-04 LAB — VITAMIN D 25 HYDROXY (VIT D DEFICIENCY, FRACTURES): VIT D 25 HYDROXY: 42 ng/mL (ref 30–100)

## 2018-05-04 LAB — PHOSPHORUS: Phosphorus: 3.6 mg/dL (ref 2.5–4.5)

## 2018-06-03 ENCOUNTER — Other Ambulatory Visit: Payer: Self-pay | Admitting: Family Medicine

## 2018-06-07 ENCOUNTER — Other Ambulatory Visit: Payer: Self-pay | Admitting: Family Medicine

## 2018-06-07 DIAGNOSIS — Z1231 Encounter for screening mammogram for malignant neoplasm of breast: Secondary | ICD-10-CM

## 2018-06-17 ENCOUNTER — Ambulatory Visit (INDEPENDENT_AMBULATORY_CARE_PROVIDER_SITE_OTHER): Payer: Medicare Other

## 2018-06-17 DIAGNOSIS — Z23 Encounter for immunization: Secondary | ICD-10-CM

## 2018-06-19 ENCOUNTER — Other Ambulatory Visit: Payer: Self-pay | Admitting: Family Medicine

## 2018-06-19 NOTE — Progress Notes (Signed)
Add loratidine to med list

## 2018-07-01 ENCOUNTER — Other Ambulatory Visit: Payer: Self-pay | Admitting: Family Medicine

## 2018-07-17 ENCOUNTER — Ambulatory Visit (INDEPENDENT_AMBULATORY_CARE_PROVIDER_SITE_OTHER): Payer: Medicare Other | Admitting: Nurse Practitioner

## 2018-07-17 ENCOUNTER — Encounter: Payer: Self-pay | Admitting: Nurse Practitioner

## 2018-07-17 VITALS — BP 108/80 | HR 105 | Temp 97.9°F | Resp 16 | Ht 60.0 in | Wt 154.2 lb

## 2018-07-17 DIAGNOSIS — T148XXA Other injury of unspecified body region, initial encounter: Secondary | ICD-10-CM

## 2018-07-17 DIAGNOSIS — R21 Rash and other nonspecific skin eruption: Secondary | ICD-10-CM

## 2018-07-17 DIAGNOSIS — W101XXA Fall (on)(from) sidewalk curb, initial encounter: Secondary | ICD-10-CM

## 2018-07-17 MED ORDER — LIDOCAINE 5 % EX OINT: 1.0000 "application " | TOPICAL_OINTMENT | CUTANEOUS | 0 refills | Status: DC | PRN

## 2018-07-17 MED ORDER — ACETAMINOPHEN 500 MG PO TABS: 500.0000 mg | ORAL_TABLET | Freq: Four times a day (QID) | ORAL | 0 refills | Status: AC | PRN

## 2018-07-17 MED ORDER — EUCERIN EX CREA: TOPICAL_CREAM | CUTANEOUS | 0 refills | Status: DC | PRN

## 2018-07-17 NOTE — Patient Instructions (Signed)
-   Please take acetaminophen 500mg  every 6 hours as needed for pain. - Please apply ice to the area for 20 minutes 3 times a day if improving comfort.  - Can also apply aloe vera or lidocaine cream OTC  - Use Eucerin cream to scaly area behind right ear daily; if unimproved in 2 weeks or worsening please let us know.

## 2018-07-17 NOTE — Progress Notes (Signed)
Name: Tonya Lawson   MRN: [id number]    DOB: 16-Feb-1961   Date:07/17/2018       Progress Note  Subjective  Chief Complaint  Chief Complaint  Patient presents with  . Fall    fell getting out of van yesterday. Injuried her bottom.    HPI  Patient was stepping out of van and mis-stepped and landed on buttock on concrete sidewalk yesterday. Endorses pain in this area today, no gait changes, no hip or pelvic pain, no limitation in movement or changes in behavior. Has not taken anything for pain or placed ice on area.   History of osteoporosis treated with fosamax.   Also of note patient has scaly itchy area behind right ear ongoing for months.  Patient Active Problem List   Diagnosis Date Noted  . Overweight (BMI 25.0-29.9) 02/01/2018  . Colon cancer screening 08/29/2017  . Encounter for screening for HIV 08/29/2017  . Encounter for hepatitis C screening test for low risk patient 08/29/2017  . DNR (do not resuscitate) 08/29/2017  . Medication monitoring encounter 04/07/2016  . Elevated cholesterol 04/07/2016  . Preventative health care 08/30/2015  . Hypertension   . Osteoporosis   . Intellectual disability   . Kyphosis of thoracic region     Past Medical History:  Diagnosis Date  . DNR (do not resuscitate) 08/29/2017  . Hypertension   . Kyphosis of thoracic region   . Mental retardation   . Osteoporosis     Past Surgical History:  Procedure Laterality Date  . ABDOMINAL HYSTERECTOMY     completed    Social History   Tobacco Use  . Smoking status: Never Smoker  . Smokeless tobacco: Never Used  Substance Use Topics  . Alcohol use: No     Current Outpatient Medications:  .  alendronate (FOSAMAX) 70 MG tablet, TAKE 1 TABLET BY MOUTH WEEKLY, Disp: 4 tablet, Rfl: 11 .  amLODipine (NORVASC) 5 MG tablet, TAKE 1 TABLET BY MOUTH EVERY DAY, Disp: 30 tablet, Rfl: 0 .  ASPIRIN LOW DOSE 81 MG EC tablet, TAKE 1 TABLET BY MOUTH ONCE DAILY, Disp: 30 tablet, Rfl: 11 .   Calcium Carbonate-Vitamin D 600-400 MG-UNIT tablet, TAKE 1 TABLET BY MOUTH TWICE DAILY, Disp: 60 tablet, Rfl: 11 .  Cranberry-Vitamin C-Vitamin E 4200-20-3 MG-MG-UNIT CAPS, Take 1 tablet by mouth daily., Disp: 30 capsule, Rfl: 11 .  loratadine (CLARITIN) 10 MG tablet, Take 1 tablet (10 mg total) by mouth daily as needed for allergies., Disp: 30 tablet, Rfl: 11 .  Multiple Vitamins-Minerals (MULTIVITAMIN WITH MINERALS) tablet, TAKE 1 TABLET BY MOUTH ONCE DAILY, Disp: 30 tablet, Rfl: 11 .  pravastatin (PRAVACHOL) 20 MG tablet, Take 1 tablet (20 mg total) by mouth at bedtime. For cholesterol, Disp: 90 tablet, Rfl: 3 .  vitamin B-12 (CYANOCOBALAMIN) 100 MCG tablet, TAKE 1 TABLET BY MOUTH ONCE DAILY, Disp: 30 tablet, Rfl: 11  No Known Allergies  ROS   No other specific complaints in a complete review of systems (except as listed in HPI above).  Objective  Vitals:   07/17/18 1332  BP: 108/80  Pulse: (!) 105  Resp: 16  Temp: 97.9 F (36.6 C)  TempSrc: Oral  SpO2: 98%  Weight: 154 lb 3.2 oz (69.9 kg)  Height: 5' (1.524 m)    Body mass index is 30.12 kg/m.  Nursing Note and Vital Signs reviewed.  Physical Exam  Constitutional: She is oriented to person, place, and time. She appears well-developed and well-nourished. No distress.  Eyes: Pupils are equal, round, and reactive to light. Conjunctivae are normal.  Cardiovascular: Intact distal pulses.  Pulmonary/Chest: Effort normal and breath sounds normal.  Abdominal: Soft. Bowel sounds are normal. There is no tenderness.  Musculoskeletal: Normal range of motion. She exhibits tenderness. She exhibits no edema or deformity.       Right hip: Normal.       Left hip: Normal.       Lumbar back: Normal.  Neurological: She is alert and oriented to person, place, and time.  Skin: Skin is warm and dry.     Psychiatric: She has a normal mood and affect. Her behavior is normal.    No results found for this or any previous visit (from the  past 48 hour(s)).  Assessment & Plan 1. Fall (on)(from) sidewalk curb, initial encounter No spinal tenderness, behavior normal do not think imaging is necessary at this time discussed with patient and caregiver  - acetaminophen (TYLENOL) 500 MG tablet; Take 1 tablet (500 mg total) by mouth every 6 (six) hours as needed.  Dispense: 30 tablet; Refill: 0  2. Bruising - acetaminophen (TYLENOL) 500 MG tablet; Take 1 tablet (500 mg total) by mouth every 6 (six) hours as needed.  Dispense: 30 tablet; Refill: 0 - lidocaine (XYLOCAINE) 5 % ointment; Apply 1 application topically as needed.  Dispense: 35.44 g; Refill: 0  3. Scaly patch rash - Skin Protectants, Misc. (EUCERIN) cream; Apply topically as needed for dry skin.  Dispense: 454 g; Refill: 0

## 2018-07-23 ENCOUNTER — Ambulatory Visit
Admission: RE | Admit: 2018-07-23 | Discharge: 2018-07-23 | Disposition: A | Discharge status: Home / Self Care | Payer: Medicare Other | Source: Ambulatory Visit | Attending: Family Medicine | Admitting: Family Medicine

## 2018-07-23 ENCOUNTER — Ambulatory Visit (INDEPENDENT_AMBULATORY_CARE_PROVIDER_SITE_OTHER): Payer: Medicare Other | Admitting: Family Medicine

## 2018-07-23 ENCOUNTER — Encounter: Payer: Self-pay | Admitting: Family Medicine

## 2018-07-23 ENCOUNTER — Ambulatory Visit
Admission: RE | Admit: 2018-07-23 | Discharge: 2018-07-23 | Disposition: A | Discharge status: Home / Self Care | Payer: Medicare Other | Source: Ambulatory Visit | Attending: General Practice | Admitting: General Practice

## 2018-07-23 VITALS — BP 124/68 | HR 95 | Temp 98.2°F | Wt 152.8 lb

## 2018-07-23 DIAGNOSIS — S7002XA Contusion of left hip, initial encounter: Secondary | ICD-10-CM | POA: Diagnosis not present

## 2018-07-23 DIAGNOSIS — S7001XD Contusion of right hip, subsequent encounter: Secondary | ICD-10-CM | POA: Diagnosis not present

## 2018-07-23 DIAGNOSIS — S300XXD Contusion of lower back and pelvis, subsequent encounter: Secondary | ICD-10-CM

## 2018-07-23 DIAGNOSIS — H109 Unspecified conjunctivitis: Secondary | ICD-10-CM

## 2018-07-23 DIAGNOSIS — S40022A Contusion of left upper arm, initial encounter: Secondary | ICD-10-CM | POA: Diagnosis not present

## 2018-07-23 DIAGNOSIS — M79602 Pain in left arm: Secondary | ICD-10-CM

## 2018-07-23 DIAGNOSIS — S00521A Blister (nonthermal) of lip, initial encounter: Secondary | ICD-10-CM | POA: Diagnosis not present

## 2018-07-23 DIAGNOSIS — Z1231 Encounter for screening mammogram for malignant neoplasm of breast: Secondary | ICD-10-CM | POA: Diagnosis not present

## 2018-07-23 DIAGNOSIS — S7001XA Contusion of right hip, initial encounter: Secondary | ICD-10-CM | POA: Diagnosis not present

## 2018-07-23 MED ORDER — AZITHROMYCIN 1 % OP SOLN: OPHTHALMIC | 0 refills | Status: DC

## 2018-07-23 NOTE — Patient Instructions (Addendum)
Please go across the street for xrays Start the eye drops We'll have someone call you

## 2018-07-23 NOTE — Progress Notes (Signed)
BP 124/68   Pulse 95   Temp 98.2 F (36.8 C) (Oral)   Wt 152 lb 12.8 oz (69.3 kg)   SpO2 97%   BMI 29.84 kg/m    Subjective:    Patient ID: Tonya Lawson, female    DOB: May 10, 1961, 57 y.o.   MRN: [id number]  HPI: Tonya Lawson is a 57 y.o. female  Chief Complaint  Patient presents with  . Blister  . Conjunctivitis    HPI Patient is here for several things  She has a blister on the side of the mouth, left side; was there yesterday; she denies anybody bumping her there; rash behind the right ear too; does not itch; lip does not itch; does not hurt at all; new teeth and none are broken  She has conjunctivitis; drainage from the right eye; started to crust yesterday  Bruise on her bottom from a previous fall; she saw one of the other providers here for that; Nov 6th, she fell getting uot of the [place]; had localized pain; no change in ambulation, ambulatory without problems She has voiced fear of a man; man at work yells Now she is saying that somebody at work is yelling at her; one man is in a wheelchair; he hollers, his name is Jeral Fruit but he is in a wheelchair and can't move to hurt her I asked if anything else hurts; nothing else hurts she says  Depression screen [facility] 2/9 07/23/2018 04/09/2018 02/01/2018 12/13/2017 10/09/2017  Decreased Interest 0 0 0 0 0  Down, Depressed, Hopeless 0 0 0 0 0  PHQ - 2 Score 0 0 0 0 0  Altered sleeping 0 - - - -  Tired, decreased energy 0 - - - -  Change in appetite 0 - - - -  Feeling bad or failure about yourself  0 - - - -  Trouble concentrating 0 - - - -  Moving slowly or fidgety/restless 0 - - - -  Suicidal thoughts 0 - - - -  PHQ-9 Score 0 - - - -  Difficult doing work/chores Not difficult at all - - - -   Fall Risk  07/23/2018 07/17/2018 04/09/2018 04/09/2018 02/01/2018  Falls in the past year? 1 1 No No No  Number falls in past yr: 0 0 - - -  Injury with Fall? 1 1 - - -    Relevant past medical, surgical, family and social history  reviewed Past Medical History:  Diagnosis Date  . DNR (do not resuscitate) 08/29/2017  . Hypertension   . Kyphosis of thoracic region   . Mental retardation   . Osteoporosis    Past Surgical History:  Procedure Laterality Date  . ABDOMINAL HYSTERECTOMY     completed   Family History  Problem Relation Age of Onset  . Diabetes Mother   . Stroke Mother   . Cancer Father        brain  . Breast cancer Maternal Aunt 70  . Cancer Brother    Social History   Tobacco Use  . Smoking status: Never Smoker  . Smokeless tobacco: Never Used  Substance Use Topics  . Alcohol use: No  . Drug use: No     [facility] Visit from 07/23/2018 in [facility]  AUDIT-C Score  0      Interim medical history since last visit reviewed. Allergies and medications reviewed  Review of Systems Per HPI unless specifically indicated above     Objective:  BP 124/68   Pulse 95   Temp 98.2 F (36.8 C) (Oral)   Wt 152 lb 12.8 oz (69.3 kg)   SpO2 97%   BMI 29.84 kg/m   Wt Readings from Last 3 Encounters:  07/23/18 152 lb 12.8 oz (69.3 kg)  07/17/18 154 lb 3.2 oz (69.9 kg)  04/09/18 152 lb 12.8 oz (69.3 kg)    Physical Exam  HENT:  Head:    Rash behind the RIGHT ear in the post-auricular crease; crusted scabbed area on the lower lateral lip LEFT side, extends to lateral canthus; no evidence of trauma, no split; no broken teeth  Skin:     Bruising over the buttocks, left worse than the right; palpable contusion LEFT buttock; bruise over the left lateral arm; bruise on the right side mons pubis / groin area; bruise on the right hip, lateral right leg  Psychiatric: Her affect is blunt. She is withdrawn. She is not aggressive, not hyperactive, not actively hallucinating and not combative. Cognition and memory are impaired. She is communicative.  Very poor eye contact; evasive with answers; poor historian; she is NOT her usual laughing self She is attentive.    Results for  orders placed or performed in visit on 05/03/18  Phosphorus  Result Value Ref Range   Phosphorus 3.6 2.5 - 4.5 mg/dL  VITAMIN D 25 Hydroxy (Vit-D Deficiency, Fractures)  Result Value Ref Range   Vit D, 25-Hydroxy 42 30 - 100 ng/mL  Basic metabolic panel  Result Value Ref Range   Glucose, Bld 90 65 - 99 mg/dL   BUN 13 7 - 25 mg/dL   Creat 0.91 0.50 - 1.05 mg/dL   BUN/Creatinine Ratio NOT APPLICABLE 6 - 22 (calc)   Sodium 138 135 - 146 mmol/L   Potassium 4.4 3.5 - 5.3 mmol/L   Chloride 101 98 - 110 mmol/L   CO2 28 20 - 32 mmol/L   Calcium 9.7 8.6 - 10.4 mg/dL      Assessment & Plan:   Problem List Items Addressed This Visit    None    Visit Diagnoses    Arm pain, left    -  Primary   discussed concerns with caregiver, called [facility]PS; xrays obtained   Relevant Orders   DG Humerus Left   Contusion of buttock, subsequent encounter       discussed concerns with caregiver, called [facility]; xrays obtained; bruising is all one age by physical exam; no evidence of new injuries   Relevant Orders   DG HIP UNILAT WITH PELVIS 2-3 VIEWS RIGHT   DG HIP UNILAT WITH PELVIS 2-3 VIEWS LEFT   Contusion of right hip, subsequent encounter       discussed concerns with caregiver, called [facility]; xrays obtained; bruising is all one age by physical exam; no evidence of new injuries   Bacterial conjunctivitis of right eye       start eye drops in the RIGHT eye; twice a day for two days, then once a day for five more days; sent to [place] pharmacy   Blister of lip without infection, initial encounter       does not appear vesicular to suggest herpes infection; does not appear c/w injury or trauma; will observe       Follow up plan: Return in about 1 week (around 07/30/2018) for follow-up visit with Dr. Sanda Klein or Suezanne Cheshire, DNP for paperwork too.  An after-visit summary was printed and given to the patient at [place].  Please see  the patient instructions which may contain other information and  recommendations beyond what is mentioned above in the assessment and plan.  Meds ordered this encounter  Medications  . azithromycin (AZASITE) 1 % ophthalmic solution    Sig: One drop in the RIGHT eye twice a day for two days (four total doses), then one drop once a day for five more doses    Dispense:  2.5 mL    Refill:  0    Orders Placed This Encounter  Procedures  . DG Humerus Left  . DG HIP UNILAT WITH PELVIS 2-3 VIEWS RIGHT  . DG HIP UNILAT WITH PELVIS 2-3 VIEWS LEFT   Face-to-face time with patient was more than 25 minutes, >50% time spent counseling and coordination of care

## 2018-07-25 ENCOUNTER — Ambulatory Visit: Payer: Self-pay | Admitting: *Deleted

## 2018-07-25 DIAGNOSIS — H109 Unspecified conjunctivitis: Secondary | ICD-10-CM

## 2018-07-25 NOTE — Telephone Encounter (Signed)
The pt's caregiver Tonya Lawson called stating that her left eye is still running and has crusted areas, she also says that there a scabbed area on her bottom eyelid; she says that the pt is not having problems seeing; the pt was seen in office by Dr Sanda Klein on 07/23/18 and given Azithromycin eye drops; recommendations made per nurse triage protocol; Tonya Lawson states that the pt has an appointment on 07/30/18 and she will call back if the pt gets worse or develops; will route to office for notification of this encounter.   Reason for Disposition . [1] MILD eyelid swelling (puffiness) AND [2] persists > 3 days  (Exception: suspect mosquito bites)  Answer Assessment - Initial Assessment Questions 1. ONSET: "When did the swelling start?" (e.g., minutes, hours, days)     Seen in office 07/23/18 2. LOCATION: "What part of the eyelids is swollen?"     Bottom eyelid 3. SEVERITY: "How swollen is it?"     "Not too bad"; a little puffy 4. ITCHING: "Is there any itching?" If so, ask: "How much?"   (Scale 1-10; mild, moderate or severe)     no 5. PAIN: "Is the swelling painful to touch?" If so, ask: "How painful is it?"   (Scale 1-10; mild, moderate or severe)     no 6. FEVER: "Do you have a fever?" If so, ask: "What is it, how was it measured, and when did it start?"      no 7. CAUSE: "What do you think is causing the swelling?"     Seen in MD's office 07/23/18; given eye drops 8. RECURRENT SYMPTOM: "Have you had eyelid swelling before?" If so, ask: "When was the last time?" "What happened that time?"     Seen in office 07/23/18; per Tonya Lawson no p[revious episodes 9. OTHER SYMPTOMS: "Do you have any other symptoms?" (e.g., blurred vision, eye discharge, rash, runny nose)     Watery and clear 10. PREGNANCY: "Is there any chance you are pregnant?" "When was your last menstrual period?"       No  Protocols used: EYE - [facility]

## 2018-07-26 ENCOUNTER — Other Ambulatory Visit: Payer: Self-pay

## 2018-07-26 ENCOUNTER — Telehealth: Payer: Self-pay

## 2018-07-26 DIAGNOSIS — R9389 Abnormal findings on diagnostic imaging of other specified body structures: Secondary | ICD-10-CM

## 2018-07-26 NOTE — Telephone Encounter (Signed)
Please have see an optometrist TODAY It was her RIGHT eye that was affected when I saw her and the note now says left eye She needs to get checked out so refer to Dr. Matilde Sprang or Dr. Ellin Mayhew or other eye doctor TODAY

## 2018-07-26 NOTE — Addendum Note (Signed)
Addended by: Docia Furl on: 07/26/2018 12:31 PM   Modules accepted: Orders

## 2018-07-26 NOTE — Telephone Encounter (Signed)
-----   Message from Arnetha Courser, MD sent at 07/24/2018  1:12 PM EST ----- Please let the patient's mother or other responsible party know results of the xrays: left arm is okay

## 2018-07-26 NOTE — Telephone Encounter (Signed)
Spoke with faye caregiver and we will try to get her in today at [facility].  She states it is the same eye not both

## 2018-07-30 ENCOUNTER — Ambulatory Visit (INDEPENDENT_AMBULATORY_CARE_PROVIDER_SITE_OTHER): Payer: Medicare Other | Admitting: Nurse Practitioner

## 2018-07-30 ENCOUNTER — Encounter: Payer: Self-pay | Admitting: Nurse Practitioner

## 2018-07-30 VITALS — BP 130/84 | HR 94 | Temp 98.9°F | Resp 16 | Ht 60.0 in | Wt 153.5 lb

## 2018-07-30 DIAGNOSIS — T148XXA Other injury of unspecified body region, initial encounter: Secondary | ICD-10-CM | POA: Diagnosis not present

## 2018-07-30 DIAGNOSIS — H109 Unspecified conjunctivitis: Secondary | ICD-10-CM | POA: Diagnosis not present

## 2018-07-30 NOTE — Progress Notes (Signed)
Name: Tonya Lawson   MRN: [id number]    DOB: 01-14-1961   Date:07/30/2018       Progress Note  Subjective  Chief Complaint  Chief Complaint  Patient presents with  . [place] and hurt her arm and buttocks on 07/17/2018    HPI  Patient presents for follow up of bacterial conjunctivitis which started origionally in right eye and spread to left eye. Pt used azithromycin eye drops rx by Dr. Sanda Klein much improved, no itching, discharge anymore or redness. Denies blurry vision or eye pain.   Patient had been seen for fall as well, had hurt arm and bottom- no fractures or dislocations noted in xrays. Arm and bottom still sore but patient and caregiver state it is much improved. Bruising is much improved. Patient states pain is much better, great ambulation.   Patient Active Problem List   Diagnosis Date Noted  . Overweight (BMI 25.0-29.9) 02/01/2018  . Colon cancer screening 08/29/2017  . Encounter for screening for HIV 08/29/2017  . Encounter for hepatitis C screening test for low risk patient 08/29/2017  . DNR (do not resuscitate) 08/29/2017  . Medication monitoring encounter 04/07/2016  . Elevated cholesterol 04/07/2016  . Preventative health care 08/30/2015  . Hypertension   . Osteoporosis   . Intellectual disability   . Kyphosis of thoracic region     Past Medical History:  Diagnosis Date  . DNR (do not resuscitate) 08/29/2017  . Hypertension   . Kyphosis of thoracic region   . Mental retardation   . Osteoporosis     Past Surgical History:  Procedure Laterality Date  . ABDOMINAL HYSTERECTOMY     completed    Social History   Tobacco Use  . Smoking status: Never Smoker  . Smokeless tobacco: Never Used  Substance Use Topics  . Alcohol use: No     Current Outpatient Medications:  .  acetaminophen (TYLENOL) 500 MG tablet, Take 1 tablet (500 mg total) by mouth every 6 (six) hours as needed., Disp: 30 tablet, Rfl: 0 .  alendronate (FOSAMAX) 70 MG tablet, TAKE  1 TABLET BY MOUTH WEEKLY, Disp: 4 tablet, Rfl: 11 .  amLODipine (NORVASC) 5 MG tablet, TAKE 1 TABLET BY MOUTH EVERY DAY, Disp: 30 tablet, Rfl: 0 .  ASPIRIN LOW DOSE 81 MG EC tablet, TAKE 1 TABLET BY MOUTH ONCE DAILY, Disp: 30 tablet, Rfl: 11 .  azithromycin (AZASITE) 1 % ophthalmic solution, One drop in the RIGHT eye twice a day for two days (four total doses), then one drop once a day for five more doses, Disp: 2.5 mL, Rfl: 0 .  Calcium Carbonate-Vitamin D 600-400 MG-UNIT tablet, TAKE 1 TABLET BY MOUTH TWICE DAILY, Disp: 60 tablet, Rfl: 11 .  Cranberry-Vitamin C-Vitamin E 4200-20-3 MG-MG-UNIT CAPS, Take 1 tablet by mouth daily., Disp: 30 capsule, Rfl: 11 .  lidocaine (XYLOCAINE) 5 % ointment, Apply 1 application topically as needed., Disp: 35.44 g, Rfl: 0 .  loratadine (CLARITIN) 10 MG tablet, Take 1 tablet (10 mg total) by mouth daily as needed for allergies., Disp: 30 tablet, Rfl: 11 .  Multiple Vitamins-Minerals (MULTIVITAMIN WITH MINERALS) tablet, TAKE 1 TABLET BY MOUTH ONCE DAILY, Disp: 30 tablet, Rfl: 11 .  pravastatin (PRAVACHOL) 20 MG tablet, Take 1 tablet (20 mg total) by mouth at bedtime. For cholesterol, Disp: 90 tablet, Rfl: 3 .  Skin Protectants, Misc. (EUCERIN) cream, Apply topically as needed for dry skin., Disp: 454 g, Rfl: 0 .  vitamin B-12 (CYANOCOBALAMIN)  100 MCG tablet, TAKE 1 TABLET BY MOUTH ONCE DAILY, Disp: 30 tablet, Rfl: 11  No Known Allergies  Review of Systems  Constitutional: Negative for chills and fever.  HENT: Negative for congestion, ear discharge, ear pain and sore throat.   Eyes: Negative for blurred vision, double vision, pain, discharge and redness.  Respiratory: Negative for shortness of breath.   Cardiovascular: Negative for chest pain.  Musculoskeletal: Negative for back pain, joint pain and myalgias.  Skin: Negative for rash.       Healing yellow-bruising noted to bottom  Neurological: Negative for dizziness and headaches.    No other specific  complaints in a complete review of systems (except as listed in HPI above).  Objective  Vitals:   07/30/18 1340  BP: 130/84  Pulse: 94  Resp: 16  Temp: 98.9 F (37.2 C)  TempSrc: Oral  SpO2: 98%  Weight: 153 lb 8 oz (69.6 kg)  Height: 5' (1.524 m)    Body mass index is 29.98 kg/m.  Nursing Note and Vital Signs reviewed.  Physical Exam  Constitutional: She is oriented to person, place, and time. She appears well-developed and well-nourished.  HENT:  Head: Normocephalic and atraumatic.  Eyes: Pupils are equal, round, and reactive to light. Conjunctivae are normal. Right eye exhibits no discharge. Left eye exhibits no discharge. Right conjunctiva is not injected. Left conjunctiva is not injected. Right eye exhibits normal extraocular motion. Left eye exhibits normal extraocular motion.    Cardiovascular: Normal rate.  Pulmonary/Chest: Effort normal.  Neurological: She is alert and oriented to person, place, and time.  Skin: Skin is warm and dry.          No results found for this or any previous visit (from the past 48 hour(s)).  Assessment & Plan  1. Bruising Healing appropriately, acetaminophen PRN for pain   2. Bacterial conjunctivitis resolved

## 2018-07-30 NOTE — Patient Instructions (Addendum)
Eye infection appears to have resolved and bruising on bottom is much improved. Continue tylenol as needed and ice/heat.  Please return with any other questions or concerns.  Please complete MRI of right hip 12/5

## 2018-08-15 ENCOUNTER — Ambulatory Visit
Admission: RE | Admit: 2018-08-15 | Discharge: 2018-08-15 | Disposition: A | Discharge status: Home / Self Care | Payer: Medicare Other | Source: Ambulatory Visit | Attending: Family Medicine | Admitting: Family Medicine

## 2018-08-15 DIAGNOSIS — M778 Other enthesopathies, not elsewhere classified: Secondary | ICD-10-CM | POA: Insufficient documentation

## 2018-08-15 DIAGNOSIS — M1611 Unilateral primary osteoarthritis, right hip: Secondary | ICD-10-CM | POA: Insufficient documentation

## 2018-08-15 DIAGNOSIS — R9389 Abnormal findings on diagnostic imaging of other specified body structures: Secondary | ICD-10-CM | POA: Diagnosis not present

## 2018-08-15 LAB — POCT I-STAT CREATININE: Creatinine, Ser: 0.9 mg/dL (ref 0.44–1.00)

## 2018-08-15 MED ORDER — GADOBUTROL 1 MMOL/ML IV SOLN
7.0000 mL | Freq: Once | INTRAVENOUS | Status: AC | PRN
  Administered 2018-08-15: 7 mL via INTRAVENOUS

## 2018-08-21 ENCOUNTER — Telehealth: Payer: Self-pay

## 2018-08-21 DIAGNOSIS — M25451 Effusion, right hip: Secondary | ICD-10-CM

## 2018-08-21 DIAGNOSIS — M1611 Unilateral primary osteoarthritis, right hip: Secondary | ICD-10-CM

## 2018-08-21 NOTE — Telephone Encounter (Signed)
I cannot find these other codes

## 2018-08-21 NOTE — Telephone Encounter (Signed)
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

## 2018-08-21 NOTE — Telephone Encounter (Signed)
I added one more; thank you; that should be fine

## 2018-08-26 ENCOUNTER — Telehealth: Payer: Self-pay | Admitting: Family Medicine

## 2018-08-26 NOTE — Telephone Encounter (Signed)
Care gap is present for patient's colorectal cancer screening Please resolve (either enter date if done and verified or order and discuss with mother/caregiver) Thank you

## 2018-08-27 NOTE — Telephone Encounter (Signed)
Left detailed VM.  

## 2018-09-05 DIAGNOSIS — M1611 Unilateral primary osteoarthritis, right hip: Secondary | ICD-10-CM | POA: Diagnosis not present

## 2018-09-06 ENCOUNTER — Telehealth: Payer: Self-pay | Admitting: Family Medicine

## 2018-09-06 DIAGNOSIS — Z66 Do not resuscitate: Secondary | ICD-10-CM

## 2018-09-06 NOTE — Telephone Encounter (Signed)
MOST form completed with mother

## 2018-09-06 NOTE — Assessment & Plan Note (Signed)
Discussed DNR and MOST form with mother and caregiver from [place]; patient's mother wishes DNR, but otherwise full scope of treatment including antibiotics, IV fluids, feeding tube, etc; original forms to caregiver Curlene Dolphin

## 2018-09-30 ENCOUNTER — Other Ambulatory Visit: Payer: Self-pay | Admitting: Family Medicine

## 2018-10-01 NOTE — Telephone Encounter (Signed)
Lab Results  Component Value Date   CHOL 165 04/09/2018   HDL 60 04/09/2018   LDLCALC 77 04/09/2018   TRIG 188 (H) 04/09/2018   CHOLHDL 2.8 04/09/2018    Lab Results  Component Value Date   ALT 13 04/09/2018

## 2018-10-10 ENCOUNTER — Encounter: Payer: Self-pay | Admitting: Family Medicine

## 2018-10-10 ENCOUNTER — Ambulatory Visit (INDEPENDENT_AMBULATORY_CARE_PROVIDER_SITE_OTHER): Payer: Medicare Other

## 2018-10-10 ENCOUNTER — Ambulatory Visit (INDEPENDENT_AMBULATORY_CARE_PROVIDER_SITE_OTHER): Payer: Medicare Other | Admitting: Family Medicine

## 2018-10-10 VITALS — BP 120/68 | HR 78 | Temp 97.9°F | Resp 12 | Ht 60.0 in | Wt 153.4 lb

## 2018-10-10 DIAGNOSIS — K029 Dental caries, unspecified: Secondary | ICD-10-CM | POA: Insufficient documentation

## 2018-10-10 DIAGNOSIS — Z1211 Encounter for screening for malignant neoplasm of colon: Secondary | ICD-10-CM

## 2018-10-10 DIAGNOSIS — Z0000 Encounter for general adult medical examination without abnormal findings: Secondary | ICD-10-CM | POA: Diagnosis not present

## 2018-10-10 DIAGNOSIS — F79 Unspecified intellectual disabilities: Secondary | ICD-10-CM

## 2018-10-10 DIAGNOSIS — E785 Hyperlipidemia, unspecified: Secondary | ICD-10-CM | POA: Diagnosis not present

## 2018-10-10 MED ORDER — TRIAMCINOLONE ACETONIDE 0.1 % EX CREA: 1.0000 "application " | TOPICAL_CREAM | Freq: Two times a day (BID) | CUTANEOUS | 0 refills | Status: DC

## 2018-10-10 NOTE — Patient Instructions (Addendum)
She is cleared for dental surgery She is cleared for [employer] Keep the next regular appointment, 3 months for cholesterol and weight

## 2018-10-10 NOTE — Patient Instructions (Signed)
Tonya Lawson , Thank you for taking time to come for your Medicare Wellness Visit. I appreciate your ongoing commitment to your health goals. Please review the following plan we discussed and let me know if I can assist you in the future.   Screening recommendations/referrals: Colonoscopy: due Mammogram: done 07/23/18 Bone Density: done 12/19/17 repeat in 2021 Recommended yearly ophthalmology/optometry visit for glaucoma screening and checkup Recommended yearly dental visit for hygiene and checkup  Vaccinations: Influenza vaccine: done 06/17/18 Pneumococcal vaccine: second dose due age 46 Tdap vaccine: done 12/13/17 Shingles vaccine: Shingrix discussed. Please contact your pharmacy for coverage information.      Conditions/risks identified: Recommend healthy eating and physical activity.   Next appointment: Please follow up in one year for your Medicare Annual Wellness visit.    Preventive Care 40-64 Years, Female Preventive care refers to lifestyle choices and visits with your health care provider that can promote health and wellness. What does preventive care include?  A yearly physical exam. This is also called an annual well check.  Dental exams once or twice a year.  Routine eye exams. Ask your health care provider how often you should have your eyes checked.  Personal lifestyle choices, including:  Daily care of your teeth and gums.  Regular physical activity.  Eating a healthy diet.  Avoiding tobacco and drug use.  Limiting alcohol use.  Practicing safe sex.  Taking low-dose aspirin daily starting at age 66.  Taking vitamin and mineral supplements as recommended by your health care provider. What happens during an annual well check? The services and screenings done by your health care provider during your annual well check will depend on your age, overall health, lifestyle risk factors, and family history of disease. Counseling  Your health care provider may ask  you questions about your:  Alcohol use.  Tobacco use.  Drug use.  Emotional well-being.  Home and relationship well-being.  Sexual activity.  Eating habits.  Work and work Statistician.  Method of birth control.  Menstrual cycle.  Pregnancy history. Screening  You may have the following tests or measurements:  Height, weight, and BMI.  Blood pressure.  Lipid and cholesterol levels. These may be checked every 5 years, or more frequently if you are over 32 years old.  Skin check.  Lung cancer screening. You may have this screening every year starting at age 62 if you have a 30-pack-year history of smoking and currently smoke or have quit within the past 15 years.  Fecal occult blood test (FOBT) of the stool. You may have this test every year starting at age 26.  Flexible sigmoidoscopy or colonoscopy. You may have a sigmoidoscopy every 5 years or a colonoscopy every 10 years starting at age 63.  Hepatitis C blood test.  Hepatitis B blood test.  Sexually transmitted disease (STD) testing.  Diabetes screening. This is done by checking your blood sugar (glucose) after you have not eaten for a while (fasting). You may have this done every 1-3 years.  Mammogram. This may be done every 1-2 years. Talk to your health care provider about when you should start having regular mammograms. This may depend on whether you have a family history of breast cancer.  BRCA-related cancer screening. This may be done if you have a family history of breast, ovarian, tubal, or peritoneal cancers.  Pelvic exam and Pap test. This may be done every 3 years starting at age 63. Starting at age 51, this may be done every 5 years  if you have a Pap test in combination with an HPV test.  Bone density scan. This is done to screen for osteoporosis. You may have this scan if you are at high risk for osteoporosis. Discuss your test results, treatment options, and if necessary, the need for more tests  with your health care provider. Vaccines  Your health care provider may recommend certain vaccines, such as:  Influenza vaccine. This is recommended every year.  Tetanus, diphtheria, and acellular pertussis (Tdap, Td) vaccine. You may need a Td booster every 10 years.  Zoster vaccine. You may need this after age 62.  Pneumococcal 13-valent conjugate (PCV13) vaccine. You may need this if you have certain conditions and were not previously vaccinated.  Pneumococcal polysaccharide (PPSV23) vaccine. You may need one or two doses if you smoke cigarettes or if you have certain conditions. Talk to your health care provider about which screenings and vaccines you need and how often you need them. This information is not intended to replace advice given to you by your health care provider. Make sure you discuss any questions you have with your health care provider. Document Released: 09/24/2015 Document Revised: 05/17/2016 Document Reviewed: 06/29/2015 Elsevier Interactive Patient Education  2017 [employer] Prevention in the Home Falls can cause injuries. They can happen to people of all ages. There are many things you can do to make your home safe and to help prevent falls. What can I do on the outside of my home?  Regularly fix the edges of walkways and driveways and fix any cracks.  Remove anything that might make you trip as you walk through a door, such as a raised step or threshold.  Trim any bushes or trees on the path to your home.  Use bright outdoor lighting.  Clear any walking paths of anything that might make someone trip, such as rocks or tools.  Regularly check to see if handrails are loose or broken. Make sure that both sides of any steps have handrails.  Any raised decks and porches should have guardrails on the edges.  Have any leaves, snow, or ice cleared regularly.  Use sand or salt on walking paths during winter.  Clean up any spills in your garage  right away. This includes oil or grease spills. What can I do in the bathroom?  Use night lights.  Install grab bars by the toilet and in the tub and shower. Do not use towel bars as grab bars.  Use non-skid mats or decals in the tub or shower.  If you need to sit down in the shower, use a plastic, non-slip stool.  Keep the floor dry. Clean up any water that spills on the floor as soon as it happens.  Remove soap buildup in the tub or shower regularly.  Attach bath mats securely with double-sided non-slip rug tape.  Do not have throw rugs and other things on the floor that can make you trip. What can I do in the bedroom?  Use night lights.  Make sure that you have a light by your bed that is easy to reach.  Do not use any sheets or blankets that are too big for your bed. They should not hang down onto the floor.  Have a firm chair that has side arms. You can use this for support while you get dressed.  Do not have throw rugs and other things on the floor that can make you trip. What can I do in  the kitchen?  Clean up any spills right away.  Avoid walking on wet floors.  Keep items that you use a lot in easy-to-reach places.  If you need to reach something above you, use a strong step stool that has a grab bar.  Keep electrical cords out of the way.  Do not use floor polish or wax that makes floors slippery. If you must use wax, use non-skid floor wax.  Do not have throw rugs and other things on the floor that can make you trip. What can I do with my stairs?  Do not leave any items on the stairs.  Make sure that there are handrails on both sides of the stairs and use them. Fix handrails that are broken or loose. Make sure that handrails are as long as the stairways.  Check any carpeting to make sure that it is firmly attached to the stairs. Fix any carpet that is loose or worn.  Avoid having throw rugs at the top or bottom of the stairs. If you do have throw rugs,  attach them to the floor with carpet tape.  Make sure that you have a light switch at the top of the stairs and the bottom of the stairs. If you do not have them, ask someone to add them for you. What else can I do to help prevent falls?  Wear shoes that:  Do not have high heels.  Have rubber bottoms.  Are comfortable and fit you well.  Are closed at the toe. Do not wear sandals.  If you use a stepladder:  Make sure that it is fully opened. Do not climb a closed stepladder.  Make sure that both sides of the stepladder are locked into place.  Ask someone to hold it for you, if possible.  Clearly mark and make sure that you can see:  Any grab bars or handrails.  First and last steps.  Where the edge of each step is.  Use tools that help you move around (mobility aids) if they are needed. These include:  Canes.  Walkers.  Scooters.  Crutches.  Turn on the lights when you go into a dark area. Replace any light bulbs as soon as they burn out.  Set up your furniture so you have a clear path. Avoid moving your furniture around.  If any of your floors are uneven, fix them.  If there are any pets around you, be aware of where they are.  Review your medicines with your doctor. Some medicines can make you feel dizzy. This can increase your chance of falling. Ask your doctor what other things that you can do to help prevent falls. This information is not intended to replace advice given to you by your health care provider. Make sure you discuss any questions you have with your health care provider. Document Released: 06/24/2009 Document Revised: 02/03/2016 Document Reviewed: 10/02/2014 Elsevier Interactive Patient Education  2017 [employer].

## 2018-10-10 NOTE — Progress Notes (Signed)
BP 120/68   Pulse 78   Temp 97.9 F (36.6 C) (Oral)   Resp 12   Ht 5' (1.524 m)   Wt 153 lb 6.4 oz (69.6 kg)   SpO2 98%   BMI 29.96 kg/m    Subjective:    Patient ID: Tonya Lawson, female    DOB: [birth year], 58 y.o.   MRN: [id number]  HPI: Tonya Lawson is a 58 y.o. female  Chief Complaint  Patient presents with  . paperwork    [employer] olympics  . 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) and intellectual developmental disorder Plans to bowl and play volleyball She has dry skin behind the right ear; itchy; sometimes scratches it and it will bleed They are going to pull four teeth on the bottom; needs clearance to do so  Depression screen [facility] 2/9 10/10/2018 10/10/2018 07/23/2018 04/09/2018 02/01/2018  Decreased Interest 0 0 0 0 0  Down, Depressed, Hopeless 0 0 0 0 0  PHQ - 2 Score 0 0 0 0 0  Altered sleeping 0 0 0 - -  Tired, decreased energy 0 0 0 - -  Change in appetite 0 0 0 - -  Feeling bad or failure about yourself  0 0 0 - -  Trouble concentrating 0 0 0 - -  Moving slowly or fidgety/restless 0 0 0 - -  Suicidal thoughts 0 0 0 - -  PHQ-9 Score 0 0 0 - -  Difficult doing work/chores Not difficult at all Not difficult at all Not difficult at all - -   Fall Risk  10/10/2018 10/10/2018 07/30/2018 07/23/2018 07/17/2018  Falls in the past year? 1 1 1 1 1   Number falls in past yr: 1 0 0 0 0  Injury with Fall? 1 1 1 1 1   Risk for fall due to : History of fall(s) - - - -    Relevant past medical, surgical, family and social history reviewed Past Medical History:  Diagnosis Date  . DNR (do not resuscitate) 08/29/2017  . Hypertension   . Kyphosis of thoracic region   . Mental retardation   . Osteoporosis    Past Surgical History:  Procedure Laterality Date  . ABDOMINAL HYSTERECTOMY     completed   Family History  Problem Relation Age of Onset  .  Diabetes Mother   . Stroke Mother   . Cancer Father        brain  . Breast cancer Maternal Aunt 70  . Cancer Brother    Social History   Tobacco Use  . Smoking status: Never Smoker  . Smokeless tobacco: Never Used  Substance Use Topics  . Alcohol use: No  . Drug use: No     Office Visit from 10/10/2018 in [facility]  AUDIT-C Score  0      Interim medical history since last visit reviewed. Allergies and medications reviewed  Review of Systems Per HPI unless specifically indicated above     Objective:    BP 120/68   Pulse 78   Temp 97.9 F (36.6 C) (Oral)   Resp 12   Ht 5' (1.524 m)   Wt 153 lb 6.4 oz (69.6 kg)   SpO2 98%   BMI 29.96 kg/m   Wt Readings from Last 3 Encounters:  10/10/18 153 lb 6.4 oz (69.6 kg)  10/10/18 153 lb 6.4 oz (69.6 kg)  07/30/18 153 lb 8 oz (69.6 kg)    Physical Exam Constitutional:      General: She is not in acute distress.    Appearance: She is well-developed. She is not diaphoretic.  HENT:     Head: Normocephalic and atraumatic.     Mouth/Throat:     Dentition: Abnormal dentition. Dental caries present. No gingival swelling or dental abscesses.  Eyes:     General: No scleral icterus. Neck:     Thyroid: No thyromegaly.  Cardiovascular:     Rate and Rhythm: Normal rate and regular rhythm.     Heart sounds: Normal heart sounds. No murmur.  Pulmonary:     Effort: Pulmonary effort is normal. No respiratory distress.     Breath sounds: Normal breath sounds. No wheezing.  Abdominal:     General: Bowel sounds are normal. There is no distension.     Palpations: Abdomen is soft.  Skin:    General: Skin is warm and dry.     Coloration: Skin is not pale.  Neurological:     Mental Status: She is alert.  Psychiatric:        Behavior: Behavior normal.        Thought Content: Thought content normal.        Judgment: Judgment normal.     Results for orders placed or performed during the hospital encounter of  08/15/18  I-STAT creatinine  Result Value Ref Range   Creatinine, Ser 0.90 0.44 - 1.00 mg/dL      Assessment & Plan:   Problem List Items Addressed This Visit      Digestive   Dental caries    Cleared for planned dental extraction        Other   Intellectual disability    Form for [employer] completed; no restrictions to competition      Dyslipidemia       Follow up plan: Return in about 3 months (around 01/09/2019) for follow-up visit with Dr. Sanda Klein.  An after-visit summary was printed and given to the patient at [facility].  Please see the patient instructions which may contain other information and recommendations beyond what is mentioned above in the assessment and plan.  Meds ordered this encounter  Medications  . triamcinolone cream (KENALOG) 0.1 %    Sig: Apply 1 application topically 2 (two) times daily. Behind the right ear as needed    Dispense:  30 g    Refill:  0    No orders of the defined types were placed in this encounter.

## 2018-10-10 NOTE — Progress Notes (Signed)
Subjective:   Tonya Lawson is a 58 y.o. female who presents for Medicare Annual (Subsequent) preventive examination.  Review of Systems:   Cardiac Risk Factors include: hypertension;dyslipidemia     Objective:     Vitals: BP 120/68   Pulse 78   Temp 97.9 F (36.6 C) (Oral)   Resp 12   Ht 5' (1.524 m)   Wt 153 lb 6.4 oz (69.6 kg)   SpO2 98%   BMI 29.96 kg/m   Body mass index is 29.96 kg/m.  Advanced Directives 10/10/2018 04/09/2017 10/09/2016 04/07/2016 12/23/2015  Does Patient Have a Medical Advance Directive? Yes No No No Yes  Type of Advance Directive Out of facility DNR (pink MOST or yellow form) - - - Living will  Copy of [place] in Chart? - - - - No - copy requested  Would patient like information on creating a medical advance directive? - - - No - patient declined information -  Pre-existing out of facility DNR order (yellow form or pink MOST form) Pink MOST form placed in chart (order not valid for inpatient use) - - - -    Tobacco Social History   Tobacco Use  Smoking Status Never Smoker  Smokeless Tobacco Never Used     Counseling given: Not Answered   Clinical Intake:                       Past Medical History:  Diagnosis Date  . DNR (do not resuscitate) 08/29/2017  . Hypertension   . Kyphosis of thoracic region   . Mental retardation   . Osteoporosis    Past Surgical History:  Procedure Laterality Date  . ABDOMINAL HYSTERECTOMY     completed   Family History  Problem Relation Age of Onset  . Diabetes Mother   . Stroke Mother   . Cancer Father        brain  . Breast cancer Maternal Aunt 70  . Cancer Brother    Social History   Socioeconomic History  . Marital status: Unknown    Spouse name: Not on file  . Number of children: Not on file  . Years of education: Not on file  . Highest education level: Not on file  Occupational History  . Not on file  Social Needs  . Financial resource strain: Not  hard at all  . Food insecurity:    Worry: Never true    Inability: Never true  . Transportation needs:    Medical: No    Non-medical: No  Tobacco Use  . Smoking status: Never Smoker  . Smokeless tobacco: Never Used  Substance and Sexual Activity  . Alcohol use: No  . Drug use: No  . Sexual activity: Never  Lifestyle  . Physical activity:    Days per week: 7 days    Minutes per session: 20 min  . Stress: Not at all  Relationships  . Social connections:    Talks on phone: More than three times a week    Gets together: More than three times a week    Attends religious service: Never    Active member of club or organization: No    Attends meetings of clubs or organizations: Never    Relationship status: Never married  Other Topics Concern  . Not on file  Social History Narrative  . Not on file    Outpatient Encounter Medications as of 10/10/2018  Medication Sig  .  acetaminophen (TYLENOL) 500 MG tablet Take 1 tablet (500 mg total) by mouth every 6 (six) hours as needed.  Marland Kitchen alendronate (FOSAMAX) 70 MG tablet TAKE 1 TABLET BY MOUTH WEEKLY  . amLODipine (NORVASC) 5 MG tablet TAKE 1 TABLET BY MOUTH EVERY DAY  . ASPIRIN LOW DOSE 81 MG EC tablet TAKE 1 TABLET BY MOUTH ONCE DAILY  . azithromycin (AZASITE) 1 % ophthalmic solution One drop in the RIGHT eye twice a day for two days (four total doses), then one drop once a day for five more doses (Patient not taking: Reported on 10/10/2018)  . Calcium Carbonate-Vitamin D 600-400 MG-UNIT tablet TAKE 1 TABLET BY MOUTH TWICE DAILY  . Cranberry-Vitamin C-Vitamin E 4200-20-3 MG-MG-UNIT CAPS Take 1 tablet by mouth daily.  Marland Kitchen lidocaine (XYLOCAINE) 5 % ointment Apply 1 application topically as needed. (Patient not taking: Reported on 10/10/2018)  . loratadine (CLARITIN) 10 MG tablet Take 1 tablet (10 mg total) by mouth daily as needed for allergies.  . Multiple Vitamins-Minerals (MULTIVITAMIN WITH MINERALS) tablet TAKE 1 TABLET BY MOUTH ONCE DAILY    . pravastatin (PRAVACHOL) 20 MG tablet TAKE 1 TABLET BY MOUTH AT BEDTIME FOR CHOLESTEROL.  . Skin Protectants, Misc. (EUCERIN) cream Apply topically as needed for dry skin. (Patient not taking: Reported on 10/10/2018)  . vitamin B-12 (CYANOCOBALAMIN) 100 MCG tablet TAKE 1 TABLET BY MOUTH ONCE DAILY   No facility-administered encounter medications on file as of 10/10/2018.     Activities of Daily Living In your present state of health, do you have any difficulty performing the following activities: 10/10/2018 10/10/2018  Hearing? Y N  Comment was build up no hearing aids -  Vision? N N  Difficulty concentrating or making decisions? N N  Walking or climbing stairs? N N  Dressing or bathing? N N  Doing errands, shopping? N N  Preparing Food and eating ? N -  Using the Toilet? N -  In the past six months, have you accidently leaked urine? N -  Do you have problems with loss of bowel control? N -  Managing your Medications? N -  Managing your Finances? N -  Housekeeping or managing your Housekeeping? N -  Some recent data might be hidden    Patient Care Team: Lada, Satira Anis, MD as PCP - General (Family Medicine) Clyde Canterbury, MD as Referring Physician (Otolaryngology)    Assessment:   This is a routine wellness examination for Tonya Lawson.  Exercise Activities and Dietary recommendations Current Exercise Habits: Home exercise routine, Type of exercise: walking, Time (Minutes): 20, Frequency (Times/Week): 7, Weekly Exercise (Minutes/Week): 140, Intensity: Mild, Exercise limited by: None identified  Goals    . Increase physical activity     Recommend increasing physical activity to at least 150 minutes per week        Fall Risk Fall Risk  10/10/2018 10/10/2018 07/30/2018 07/23/2018 07/17/2018  Falls in the past year? 1 1 1 1 1   Number falls in past yr: 1 0 0 0 0  Injury with Fall? 1 1 1 1 1   Risk for fall due to : History of fall(s) - - - -   FALL RISK PREVENTION PERTAINING TO  THE HOME:  Any stairs in or around the home WITH handrails? No  Home free of loose throw rugs in walkways, pet beds, electrical cords, etc? Yes  Adequate lighting in your home to reduce risk of falls? Yes   ASSISTIVE DEVICES UTILIZED TO PREVENT FALLS:  Life alert? No  Use of a cane, walker or w/c? No  Grab bars in the bathroom? Yes  Shower chair or bench in shower? Yes  Elevated toilet seat or a handicapped toilet? Yes   DME ORDERS:  DME order needed?  No   TIMED UP AND GO:  Was the test performed? Yes .  Length of time to ambulate 10 feet: 6 sec.   GAIT:  Appearance of gait: Gait stead-fast and withithout the use of an assistive device.  Education: Fall risk prevention has been discussed.  Intervention(s) required? No   Depression Screen PHQ 2/9 Scores 10/10/2018 10/10/2018 07/23/2018 04/09/2018  PHQ - 2 Score 0 0 0 0  PHQ- 9 Score 0 0 0 -     Cognitive Function - pt declined 10/10/18     6CIT Screen 08/29/2017  What Year? 4 points  What month? 3 points  What time? 3 points  Count back from 20 4 points  Months in reverse 4 points  Repeat phrase 10 points  Total Score 28    Immunization History  Administered Date(s) Administered  . Influenza,inj,Quad PF,6+ Mos 08/02/2015, 07/12/2016, 06/28/2017, 06/17/2018  . Pneumococcal Polysaccharide-23 06/23/2000, 07/24/2005  . Td 06/20/2005  . Tdap 12/13/2017  . Zoster 09/17/2013    Qualifies for Shingles Vaccine? Yes  Zostavax completed 09/17/13. Due for Shingrix. Education has been provided regarding the importance of this vaccine. Pt has been advised to call insurance company to determine out of pocket expense. Advised may also receive vaccine at local pharmacy or Health Dept. Verbalized acceptance and understanding.  Tdap:Up to date   Flu Vaccine: Up to date  Pneumococcal Vaccine: Due for Pneumococcal vaccine. PCV 13 at age 27  Screening Tests Health Maintenance  Topic Date Due  . PAP SMEAR-Modifier  06/08/1982   . Fecal DNA (Cologuard)  06/09/2011  . MAMMOGRAM  07/23/2020  . TETANUS/TDAP  12/14/2027  . INFLUENZA VACCINE  Completed  . Hepatitis C Screening  Completed  . HIV Screening  Completed   Cancer Screenings:  Colorectal Screening: Not completed. Pt previously refused cologuard screening. Pt and caregiver agreed to try again today. Cologuard ordered.    Mammogram: Completed 07/23/18. Repeat every year  Bone Density: Completed 12/19/17. Results reflect OSTEOPOROSIS. Repeat every 2 years.   Lung Cancer Screening: (Low Dose CT Chest recommended if Age 94-80 years, 30 pack-year currently smoking OR have quit w/in 15years.) does not qualify.   Additional Screening:  Hepatitis C Screening: does qualify; Completed 08/29/17  Vision Screening: Recommended annual ophthalmology exams for early detection of glaucoma and other disorders of the eye. Is the patient up to date with their annual eye exam?  Yes  Who is the provider or what is the name of the office in which the pt attends annual eye exams? Dr. Matilde Sprang  Dental Screening: Recommended annual dental exams for proper oral hygiene  Community Resource Referral:  CRR required this visit?  No      Plan:     I have personally reviewed and addressed the Medicare Annual Wellness questionnaire and have noted the following in the patient's chart:  A. Medical and social history B. Use of alcohol, tobacco or illicit drugs  C. Current medications and supplements D. Functional ability and status E.  Nutritional status F.  Physical activity G. Advance directives H. List of other physicians I.  Hospitalizations, surgeries, and ER visits in previous 12 months J.  [place] such as hearing and vision if needed, cognitive and depression L. Referrals and  appointments   In addition, I have reviewed and discussed with patient certain preventive protocols, quality metrics, and best practice recommendations. A written personalized care plan  for preventive services as well as general preventive health recommendations were provided to patient.   Signed,  Clemetine Marker, LPN Nurse Health Advisor   Nurse Notes: pt accompanied to visit today by caregiver from her group home

## 2018-10-10 NOTE — Assessment & Plan Note (Signed)
Cleared for planned dental extraction

## 2018-10-10 NOTE — Assessment & Plan Note (Signed)
Form for [employer] completed; no restrictions to competition

## 2018-10-16 ENCOUNTER — Telehealth: Payer: Self-pay

## 2018-10-16 NOTE — Telephone Encounter (Signed)
   Copied from [place] [phone]. Topic: General - Other >> Oct 16, 2018 10:15 AM Yvette Rack wrote: Reason for CRM: Pt caregiver Curlene Dolphin stated she was returning a call to [place]. Faye requests call back. Cb# [phone]    Returned call to Curlene Dolphin. Need to verify who would be [place] and her sister's guardians and POA in the even something happens to their mother. If there is any current documentation we need to obtain a copy for our records. Left msg for her to call me back directly at [phone].

## 2018-10-25 ENCOUNTER — Telehealth: Payer: Self-pay | Admitting: Family Medicine

## 2018-10-25 NOTE — Telephone Encounter (Signed)
Patient has care gap for colon cancer screening and an unresolved Cologuard order Please contact patient, urge them to complete the Cologuard kit; offer to re-order if needed Thank you

## 2018-10-25 NOTE — Telephone Encounter (Signed)
This was just reordered on her last week

## 2018-10-25 NOTE — Telephone Encounter (Signed)
Spoke to [place] who advised me to contact the [employer] office.   Left message for Vassie Loll at [phone] ext [phone] to contact me for this information.

## 2018-10-28 ENCOUNTER — Ambulatory Visit: Payer: Self-pay | Admitting: *Deleted

## 2018-10-28 NOTE — Telephone Encounter (Signed)
Tonya Lawson, day manager/care giver of pt  calling to request a preventative prescription for Tamiflu. Pt was exposed on yesterday by another resident in the group home who was diagnosed with the flu and PNA and is now hospitalized. The only symptom that the pt has currently is a cough. Pt also had flu vaccine this year. Tonya Lawson requesting for prescription to be sent to [place]. Tonya Lawson can be contacted at [phone].  Reason for Disposition . [1] Influenza EXPOSURE within last 48 hours (2 days) AND [2] NOT HIGH RISK AND [3] strongly requests antiviral medication  Answer Assessment - Initial Assessment Questions 1. TYPE of EXPOSURE: "How were you exposed?" (e.g., close contact, not a close contact)     Exposed by another resident in group home 2. DATE of EXPOSURE: "When did the exposure occur?" (e.g., hour, days, weeks)     yesterday 3. HIGH RISK for COMPLICATIONS: "Do you have any heart or lung problems? Do you have a weakened immune system?" (e.g., CHF, COPD, asthma, HIV positive, chemotherapy, renal failure, diabetes mellitus, sickle cell anemia)     No 5. SYMPTOMS: "Do you have any symptoms?" (e.g., cough, fever, sore throat, difficulty breathing).     cough  Protocols used: INFLUENZA EXPOSURE-A-AH

## 2018-10-29 ENCOUNTER — Other Ambulatory Visit: Payer: Self-pay | Admitting: Family Medicine

## 2018-10-29 MED ORDER — OSELTAMIVIR PHOSPHATE 75 MG PO CAPS: 75.0000 mg | ORAL_CAPSULE | Freq: Every day | ORAL | 0 refills | Status: DC

## 2018-10-29 NOTE — Telephone Encounter (Signed)
Left detailed VM with Letta Median. CRM created.

## 2018-10-29 NOTE — Telephone Encounter (Signed)
Rx sent If she gets sick, contact us or take her to urgent care or ER right away

## 2018-11-11 ENCOUNTER — Telehealth: Payer: Self-pay | Admitting: Family Medicine

## 2018-11-11 NOTE — Telephone Encounter (Signed)
Copied from [place] [phone]. Topic: Quick Communication - See Telephone Encounter >> Nov 11, 2018  4:02 PM Blase Mess A wrote: CRM for notification. See Telephone encounter for: 11/11/18.  The patient's care giver is calling to see if Dr. Sanda Klein can send an updated letter stating it is ok to have dental surgery. [phone]

## 2018-11-12 NOTE — Telephone Encounter (Signed)
Please see assessment and plan from my last visit: Assessment & Plan:       Problem List Items Addressed This Visit            Digestive    Dental caries     Cleared for planned dental extraction       She was cleared for dental surgery Fax the office note or fax this note if needed

## 2018-11-12 NOTE — Telephone Encounter (Signed)
faxed

## 2018-11-13 NOTE — Telephone Encounter (Signed)
Patient called to give the correct fax number.  It is [phone].  Please advise and re- fax the information.

## 2018-11-13 NOTE — Telephone Encounter (Signed)
refaxed to correct number

## 2018-11-27 ENCOUNTER — Other Ambulatory Visit: Payer: Self-pay | Admitting: Family Medicine

## 2018-11-27 NOTE — Telephone Encounter (Signed)
Please fax to pharmacy as well, thank you

## 2018-11-27 NOTE — Telephone Encounter (Signed)
Note to CMA to fax discontinue alendronate order to pharmacy as well

## 2018-11-27 NOTE — Telephone Encounter (Signed)
Let's have patient STOP her alendronate for the next 3-1/2 months Please call group home and the pharmacy and tell them we're stopping this temporarily, may resume the first week of July   Fax order to group home and pharmacy if needed  New order:  DISCONTINUE alendronate immediately and resume the week of March 12, 2019  Loma Sousa, M.D.

## 2018-11-27 NOTE — Telephone Encounter (Signed)
Info given to faye and faxed to [place] [facility] [phone]

## 2018-11-28 ENCOUNTER — Telehealth: Payer: Self-pay

## 2018-11-28 NOTE — Telephone Encounter (Signed)
Its in the note below

## 2018-11-28 NOTE — Telephone Encounter (Signed)
That sounds like a teaching issue They just set up the container in the toilet bowl and the patient defecates right into the container Please offer teaching or ask them to view video or contact Cologuard rep to help There is a video at [email or web address] If they still don't think they can get this done, contact mother and see if she wants patient to get a colonoscopy

## 2018-11-28 NOTE — Telephone Encounter (Signed)
I called number (sorry thought that was just a fax #), don't see dentist's name Dr. Cecille Amsterdam is the dentist I spoke with her directly Voiced concerns about current infection going around, can procedure wait; they are not canceling right now; surgery will be at surgical center, not main office; I let her know our system's policy right now for delaying scans, elective procedures Also, she's been on fosamax, asked about resuming; dentist says five days is okay; I'll still keep her off for 3 months (remind me note for future to start back July 1) Okay to proceed with surgery; verbal okay given to dentist; I am not in the office to sign anything, but staff can help (perhaps NP can sign in absentia) if something written still needed

## 2018-11-28 NOTE — Telephone Encounter (Signed)
PCP is out of office. Pre-clearance paperwork requesting patient have H&P within 30 days of procedure. If dentist office is not willing to waive this please have patient come in within 30 days of procedure for surgical clearance

## 2018-11-28 NOTE — Telephone Encounter (Signed)
Can you please get me the number and name of the dentist? I'd like to call him or her personally to see if this is an urgent matter or if it can be pushed back with current illness outbreak; systems are opting to reschedule non-urgent testing and procedures at this time

## 2018-11-28 NOTE — Telephone Encounter (Signed)
Faxed info to pharmacy as well

## 2018-11-28 NOTE — Telephone Encounter (Signed)
Alma calling back from the dentist states that she is going to be faxing over all the things that she needs in order for the pt to be cleared for surgery. Please look out for fax   Alma [phone]

## 2018-11-28 NOTE — Telephone Encounter (Signed)
Copied from [place] [phone]. Topic: General - Other >> Nov 28, 2018 11:42 AM Lennox Solders wrote: Reason for DDU:KGUR Qp with group home [facility] is calling they are unable to obtain stool sample to complete cologuard test. Please advise

## 2018-11-28 NOTE — Telephone Encounter (Signed)
Info faxed

## 2018-11-29 NOTE — Telephone Encounter (Signed)
Left detailed voicemail

## 2018-12-05 DIAGNOSIS — Z1211 Encounter for screening for malignant neoplasm of colon: Secondary | ICD-10-CM | POA: Diagnosis not present

## 2018-12-10 LAB — COLOGUARD: COLOGUARD: NEGATIVE

## 2018-12-10 LAB — FECAL OCCULT BLOOD, GUAIAC: Fecal Occult Blood: NEGATIVE

## 2018-12-25 ENCOUNTER — Other Ambulatory Visit: Payer: Self-pay | Admitting: Family Medicine

## 2018-12-27 ENCOUNTER — Other Ambulatory Visit: Payer: Self-pay | Admitting: Family Medicine

## 2018-12-30 ENCOUNTER — Other Ambulatory Visit: Payer: Self-pay

## 2018-12-31 MED ORDER — CRANBERRY-VITAMIN C-VITAMIN E 4200-20-3 MG-MG-UNIT PO CAPS: 1.0000 | ORAL_CAPSULE | Freq: Every day | ORAL | 11 refills | Status: DC

## 2019-01-08 ENCOUNTER — Other Ambulatory Visit: Payer: Self-pay

## 2019-01-08 ENCOUNTER — Ambulatory Visit (INDEPENDENT_AMBULATORY_CARE_PROVIDER_SITE_OTHER): Payer: Medicare Other | Admitting: Nurse Practitioner

## 2019-01-08 ENCOUNTER — Encounter: Payer: Self-pay | Admitting: Family Medicine

## 2019-01-08 VITALS — BP 121/77 | HR 97 | Temp 95.9°F | Resp 18

## 2019-01-08 DIAGNOSIS — M810 Age-related osteoporosis without current pathological fracture: Secondary | ICD-10-CM

## 2019-01-08 DIAGNOSIS — I10 Essential (primary) hypertension: Secondary | ICD-10-CM | POA: Diagnosis not present

## 2019-01-08 DIAGNOSIS — F79 Unspecified intellectual disabilities: Secondary | ICD-10-CM | POA: Diagnosis not present

## 2019-01-08 DIAGNOSIS — E785 Hyperlipidemia, unspecified: Secondary | ICD-10-CM

## 2019-01-08 NOTE — Progress Notes (Signed)
Virtual Visit via Video Note  I connected with Tonya Lawson  on 01/08/19 at 10:20 AM EDT by a video enabled telemedicine application and verified that I am speaking with the correct person using two identifiers.   Staff discussed the limitations of evaluation and management by telemedicine and the availability of in person appointments. The patient expressed understanding and agreed to proceed.  Patient location: home  My location: home office Other people present: [place] has intellectual disability, is in home with care takers who is providing assistance with visit. Patient and caregiver deny concerns. Caregiver notes with pandemic patient has been unable to do her normal outside activities but has been coloring and doing indoor crafts but change sometimes makes her sad but is overall well.  Hypertension Takes amlodipine 5mg  without missed doses  BP Readings from Last 3 Encounters:  01/08/19 121/77  10/10/18 120/68  10/10/18 120/68    Hyperlipidemia  pravastatin  20mg  daily without missed doses  Lab Results  Component Value Date   CHOL 165 04/09/2018   HDL 60 04/09/2018   LDLCALC 77 04/09/2018   TRIG 188 (H) 04/09/2018   CHOLHDL 2.8 04/09/2018    Osteoporosis Taking calcium and vitamin D supplementation  Allergies Takes claritin,  Denies itchy watery eyes  PHQ2/9: Depression screen [facility] 2/9 01/08/2019 10/10/2018 10/10/2018 07/23/2018 04/09/2018  Decreased Interest 0 0 0 0 0  Down, Depressed, Hopeless 0 0 0 0 0  PHQ - 2 Score 0 0 0 0 0  Altered sleeping 0 0 0 0 -  Tired, decreased energy 0 0 0 0 -  Change in appetite 0 0 0 0 -  Feeling bad or failure about yourself  0 0 0 0 -  Trouble concentrating 0 0 0 0 -  Moving slowly or fidgety/restless 0 0 0 0 -  Suicidal thoughts 0 0 0 0 -  PHQ-9 Score 0 0 0 0 -  Difficult doing work/chores Not difficult at all Not difficult at all Not difficult at all Not difficult at all -   Negative  depression screening   Patient Active Problem List   Diagnosis Date Noted  . Dyslipidemia 10/10/2018  . Dental caries 10/10/2018  . Overweight (BMI 25.0-29.9) 02/01/2018  . Colon cancer screening 08/29/2017  . DNR (do not resuscitate) 08/29/2017  . Medication monitoring encounter 04/07/2016  . Preventative health care 08/30/2015  . Hypertension   . Osteoporosis   . Intellectual disability   . Kyphosis of thoracic region     Past Medical History:  Diagnosis Date  . DNR (do not resuscitate) 08/29/2017  . Hypertension   . Kyphosis of thoracic region   . Mental retardation   . Osteoporosis     Past Surgical History:  Procedure Laterality Date  . ABDOMINAL HYSTERECTOMY     completed    Social History   Tobacco Use  . Smoking status: Never Smoker  . Smokeless tobacco: Never Used  Substance Use Topics  . Alcohol use: No     Current Outpatient Medications:  .  acetaminophen (TYLENOL) 500 MG tablet, Take 1 tablet (500 mg total) by mouth every 6 (six) hours as needed., Disp: 30 tablet, Rfl: 0 .  amLODipine (NORVASC) 5 MG tablet, TAKE 1 TABLET BY MOUTH EVERY DAY FOR BLOOD PRESSURE, Disp: 30 tablet, Rfl: 11 .  ASPIRIN LOW DOSE 81 MG EC tablet, TAKE 1 TABLET BY MOUTH ONCE DAILY, Disp: 30 tablet, Rfl: 11 .  Calcium Carbonate-Vitamin D 600-400  MG-UNIT tablet, TAKE 1 TABLET BY MOUTH TWICE DAILY, Disp: 60 tablet, Rfl: 11 .  Cranberry-Vitamin C-Vitamin E 4200-20-3 MG-MG-UNIT CAPS, Take 1 tablet by mouth daily., Disp: 30 capsule, Rfl: 11 .  loratadine (CLARITIN) 10 MG tablet, Take 1 tablet (10 mg total) by mouth daily as needed for allergies., Disp: 30 tablet, Rfl: 11 .  Multiple Vitamins-Minerals (MULTIVITAMIN WITH MINERALS) tablet, TAKE 1 TABLET BY MOUTH ONCE DAILY, Disp: 30 tablet, Rfl: 11 .  pravastatin (PRAVACHOL) 20 MG tablet, TAKE 1 TABLET BY MOUTH AT BEDTIME FOR CHOLESTEROL., Disp: 90 tablet, Rfl: 3 .  triamcinolone cream (KENALOG) 0.1 %, Apply 1 application topically 2 (two)  times daily. Behind the right ear as needed, Disp: 30 g, Rfl: 0 .  vitamin B-12 (CYANOCOBALAMIN) 100 MCG tablet, TAKE 1 TABLET BY MOUTH ONCE DAILY FOR SUPPLEMENT, Disp: 30 tablet, Rfl: 11  No Known Allergies  ROS   No other specific complaints in a complete review of systems (except as listed in HPI above).  Objective  Vitals:   01/08/19 1016  BP: 121/77  Pulse: 97  Resp: 18  Temp: (!) 95.9 F (35.5 C)  TempSrc: Oral    There is no height or weight on file to calculate BMI.  Nursing Note and Vital Signs reviewed.  Physical Exam  Constitutional: Patient appears well-developed and well-nourished. No distress.  HENT: Head: Normocephalic and atraumatic. Cardiovascular: Normal rate Pulmonary/Chest: Effort normal  Musculoskeletal: Normal range of motion,  Neurological: he is alert and oriented to person, place, and time. speech and gait are normal.  Skin: No rash noted. No erythema. Small healing bruise to left leg- patient states was rough playing, denies pain, no suspected abuse Psychiatric: Patient has a normal mood and affect. behavior is normal. Judgment and thought content normal.    Assessment & Plan  1. Intellectual disability Doing well despite change in routine.   2. Dyslipidemia Stable continue medications    3. Essential hypertension Stable continue medications    4. Osteoporosis  Stable continue medications     Follow Up Instructions:   follow up in 3 months routine  I discussed the assessment and treatment plan with the patient. The patient was provided an opportunity to ask questions and all were answered. The patient agreed with the plan and demonstrated an understanding of the instructions.   The patient was advised to call back or seek an in-person evaluation if the symptoms worsen or if the condition fails to improve as anticipated.  I provided 13 minutes of non-face-to-face time during this encounter.   Fredderick Severance, NP

## 2019-01-16 ENCOUNTER — Telehealth: Payer: Self-pay

## 2019-01-16 NOTE — Telephone Encounter (Signed)
Tried to call, no answer, no option for VM

## 2019-01-16 NOTE — Telephone Encounter (Signed)
Unfortunately Dr. Sanda Klein is out of the office, please request virtual visit, I have availability tomorrow afternoon.

## 2019-01-16 NOTE — Telephone Encounter (Signed)
Copied from [place] [phone]. Topic: General - Other >> Jan 15, 2019  2:57 PM Anastaisa Stare wrote: Lenna Sciara with [name] call to req a mild anti depressant, pt having issues with being locked down

## 2019-01-17 NOTE — Telephone Encounter (Signed)
Thanks, will you try again today for the sisters

## 2019-01-20 ENCOUNTER — Encounter: Payer: Self-pay | Admitting: Nurse Practitioner

## 2019-01-20 ENCOUNTER — Ambulatory Visit (INDEPENDENT_AMBULATORY_CARE_PROVIDER_SITE_OTHER): Payer: Medicare Other | Admitting: Nurse Practitioner

## 2019-01-20 ENCOUNTER — Other Ambulatory Visit: Payer: Self-pay

## 2019-01-20 VITALS — BP 109/82 | Temp 98.2°F

## 2019-01-20 DIAGNOSIS — F419 Anxiety disorder, unspecified: Secondary | ICD-10-CM

## 2019-01-20 DIAGNOSIS — R451 Restlessness and agitation: Secondary | ICD-10-CM | POA: Diagnosis not present

## 2019-01-20 MED ORDER — HYDROXYZINE PAMOATE 25 MG PO CAPS: 25.0000 mg | ORAL_CAPSULE | Freq: Three times a day (TID) | ORAL | 0 refills | Status: DC | PRN

## 2019-01-20 NOTE — Progress Notes (Signed)
Virtual Visit via Video Note  I connected with Tonya Lawson on 01/20/19 at  1:40 PM EDT by a video enabled telemedicine application and verified that I am speaking with the correct person using two identifiers.   Staff discussed the limitations of evaluation and management by telemedicine and the availability of in person appointments. The patient expressed understanding and agreed to proceed.  Patient location: home  My location: work office Other people present: Hoyt Koch ([address]) HPI  Nurse states patient usually goes to [employer] Monday through Friday for work and goes home on the weekends but due to covid is unable to leave. Able to drive out and just wave to their mother. There is a new client that is blind and autistic in the facility and has been yelling a lot and disturbing patient.   Patient was very upset and stated she would fight her and getting very agitated with new client and staff. New client has been separated and behavior has improved. Staff states they are typically able to distract her with a activities and separate them to keep them distracted, however they a PRN medication may be beneficial when her anxiety levels are high. Patient states overall she is doing good, misses doing activities.     PHQ2/9: Depression screen [facility] 2/9 01/08/2019 10/10/2018 10/10/2018 07/23/2018 04/09/2018  Decreased Interest 0 0 0 0 0  Down, Depressed, Hopeless 0 0 0 0 0  PHQ - 2 Score 0 0 0 0 0  Altered sleeping 0 0 0 0 -  Tired, decreased energy 0 0 0 0 -  Change in appetite 0 0 0 0 -  Feeling bad or failure about yourself  0 0 0 0 -  Trouble concentrating 0 0 0 0 -  Moving slowly or fidgety/restless 0 0 0 0 -  Suicidal thoughts 0 0 0 0 -  PHQ-9 Score 0 0 0 0 -  Difficult doing work/chores Not difficult at all Not difficult at all Not difficult at all Not difficult at all -    PHQ reviewed. Negative  Patient Active Problem List   Diagnosis Date Noted  .  Dyslipidemia 10/10/2018  . Dental caries 10/10/2018  . Overweight (BMI 25.0-29.9) 02/01/2018  . Colon cancer screening 08/29/2017  . DNR (do not resuscitate) 08/29/2017  . Medication monitoring encounter 04/07/2016  . Preventative health care 08/30/2015  . Hypertension   . Osteoporosis   . Intellectual disability   . Kyphosis of thoracic region     Past Medical History:  Diagnosis Date  . DNR (do not resuscitate) 08/29/2017  . Hypertension   . Kyphosis of thoracic region   . Mental retardation   . Osteoporosis     Past Surgical History:  Procedure Laterality Date  . ABDOMINAL HYSTERECTOMY     completed    Social History   Tobacco Use  . Smoking status: Never Smoker  . Smokeless tobacco: Never Used  Substance Use Topics  . Alcohol use: No     Current Outpatient Medications:  .  acetaminophen (TYLENOL) 500 MG tablet, Take 1 tablet (500 mg total) by mouth every 6 (six) hours as needed., Disp: 30 tablet, Rfl: 0 .  amLODipine (NORVASC) 5 MG tablet, TAKE 1 TABLET BY MOUTH EVERY DAY FOR BLOOD PRESSURE, Disp: 30 tablet, Rfl: 11 .  ASPIRIN LOW DOSE 81 MG EC tablet, TAKE 1 TABLET BY MOUTH ONCE DAILY, Disp: 30 tablet, Rfl: 11 .  Calcium Carbonate-Vitamin D 600-400 MG-UNIT tablet, TAKE 1 TABLET BY  MOUTH TWICE DAILY, Disp: 60 tablet, Rfl: 11 .  Cranberry-Vitamin C-Vitamin E 4200-20-3 MG-MG-UNIT CAPS, Take 1 tablet by mouth daily., Disp: 30 capsule, Rfl: 11 .  loratadine (CLARITIN) 10 MG tablet, Take 1 tablet (10 mg total) by mouth daily as needed for allergies., Disp: 30 tablet, Rfl: 11 .  Multiple Vitamins-Minerals (MULTIVITAMIN WITH MINERALS) tablet, TAKE 1 TABLET BY MOUTH ONCE DAILY, Disp: 30 tablet, Rfl: 11 .  pravastatin (PRAVACHOL) 20 MG tablet, TAKE 1 TABLET BY MOUTH AT BEDTIME FOR CHOLESTEROL., Disp: 90 tablet, Rfl: 3 .  triamcinolone cream (KENALOG) 0.1 %, Apply 1 application topically 2 (two) times daily. Behind the right ear as needed, Disp: 30 g, Rfl: 0 .  vitamin B-12  (CYANOCOBALAMIN) 100 MCG tablet, TAKE 1 TABLET BY MOUTH ONCE DAILY FOR SUPPLEMENT, Disp: 30 tablet, Rfl: 11  No Known Allergies  ROS   No other specific complaints in a complete review of systems (except as listed in HPI above).  Objective  Vitals:   01/20/19 1301  BP: 109/82  Temp: 98.2 F (36.8 C)  TempSrc: Oral     There is no height or weight on file to calculate BMI.  Nursing Note and Vital Signs reviewed.  Physical Exam  Constitutional: Patient appears well-developed and well-nourished. No distress.  HENT: Head: Normocephalic and atraumatic. Pulmonary/Chest: Effort normal  Musculoskeletal: Normal range of motion,  Neurological: alert and oriented, speech normal.  Skin: No rash noted. No erythema.  Psychiatric: Patient has a normal mood and affect. behavior is normal. Judgment and thought content normal.    Assessment & Plan  1. Anxiety - hydrOXYzine (VISTARIL) 25 MG capsule; Take 1 capsule (25 mg total) by mouth 3 (three) times daily as needed.  Dispense: 30 capsule; Refill: 0  2. Agitation Discussed re-orienting if becoming more consistent or difficult to manage will consider daily medication, nurse says at this time does not think it is necessary     Follow Up Instructions:   routinely  I discussed the assessment and treatment plan with the patient. The patient was provided an opportunity to ask questions and all were answered. The patient agreed with the plan and demonstrated an understanding of the instructions.   The patient was advised to call back or seek an in-person evaluation if the symptoms worsen or if the condition fails to improve as anticipated.  I provided 16 minutes of non-face-to-face time during this encounter.   Fredderick Severance, NP

## 2019-03-13 ENCOUNTER — Telehealth: Payer: Self-pay | Admitting: Family Medicine

## 2019-03-19 ENCOUNTER — Other Ambulatory Visit: Payer: Self-pay | Admitting: Nurse Practitioner

## 2019-03-19 ENCOUNTER — Telehealth: Payer: Self-pay | Admitting: Nurse Practitioner

## 2019-03-19 NOTE — Telephone Encounter (Signed)
Thomas from [facility] called asking about medication "Alendronate" Pharmacist would like to know if patient needs to take medication for one more week, Lada had wrote that patient need to take it first week in July.  Medication was refused.   Please advise Thomas/[facility] pharmacy call back [phone]

## 2019-03-24 NOTE — Telephone Encounter (Signed)
Continue to hold fosamax- we will discuss dental disease with patient and routine follow-up and decide if it needs to be restarted.

## 2019-03-24 NOTE — Telephone Encounter (Signed)
Marcello Moores with [facility] notified and note faxed to [facility] per his request.

## 2019-04-22 NOTE — Progress Notes (Addendum)
Name: Tonya Lawson   MRN: [id number]    DOB: 15-May-1961   Date:04/23/2019       Progress Note  Subjective  Chief Complaint  Chief Complaint  Patient presents with  . Follow-up  . Rash    behind right ear and itching    HPI  Hypertension Takes amlodipine 5mg  without missed doses  Low sodium BP Readings from Last 3 Encounters:  04/23/19 118/72  01/20/19 109/82  01/08/19 121/77    Hyperlipidemia  pravastatin 20mg  daily without missed doses  Denies myalgias Lab Results  Component Value Date   CHOL 165 04/09/2018   HDL 60 04/09/2018   LDLCALC 77 04/09/2018   TRIG 188 (H) 04/09/2018   CHOLHDL 2.8 04/09/2018    Osteoporosis Taking calcium and vitamin D supplementation. Hold fosamax due to dental disease. Last saw the dentist last year.   Allergies Takes claritin,  Denies itchy watery eyes  Has rash behind ear that she itches has been intermittent over the past few years.   Anxiety Has vistaril PRN as needed for increased anxiety and agitation; has not needed any since it was prescribed. Doing better overall.   PHQ2/9: Depression screen [facility] 2/9 04/23/2019 01/08/2019 10/10/2018 10/10/2018 07/23/2018  Decreased Interest 0 0 0 0 0  Down, Depressed, Hopeless 0 0 0 0 0  PHQ - 2 Score 0 0 0 0 0  Altered sleeping 0 0 0 0 0  Tired, decreased energy 0 0 0 0 0  Change in appetite 0 0 0 0 0  Feeling bad or failure about yourself  0 0 0 0 0  Trouble concentrating 0 0 0 0 0  Moving slowly or fidgety/restless 0 0 0 0 0  Suicidal thoughts 0 0 0 0 0  PHQ-9 Score 0 0 0 0 0  Difficult doing work/chores Not difficult at all Not difficult at all Not difficult at all Not difficult at all Not difficult at all     PHQ reviewed. Negative  Patient Active Problem List   Diagnosis Date Noted  . Dyslipidemia 10/10/2018  . Dental caries 10/10/2018  . Overweight (BMI 25.0-29.9) 02/01/2018  . Colon cancer screening 08/29/2017  . DNR (do not resuscitate) 08/29/2017  . Hypertension    . Osteoporosis   . Intellectual disability   . Kyphosis of thoracic region     Past Medical History:  Diagnosis Date  . DNR (do not resuscitate) 08/29/2017  . Hypertension   . Kyphosis of thoracic region   . Mental retardation   . Osteoporosis     Past Surgical History:  Procedure Laterality Date  . ABDOMINAL HYSTERECTOMY     completed    Social History   Tobacco Use  . Smoking status: Never Smoker  . Smokeless tobacco: Never Used  Substance Use Topics  . Alcohol use: No     Current Outpatient Medications:  .  acetaminophen (TYLENOL) 500 MG tablet, Take 1 tablet (500 mg total) by mouth every 6 (six) hours as needed., Disp: 30 tablet, Rfl: 0 .  ASPIRIN LOW DOSE 81 MG EC tablet, TAKE 1 TABLET BY MOUTH ONCE DAILY, Disp: 30 tablet, Rfl: 11 .  Calcium Carbonate-Vitamin D 600-400 MG-UNIT tablet, TAKE 1 TABLET BY MOUTH TWICE DAILY, Disp: 60 tablet, Rfl: 11 .  Cranberry-Vitamin C-Vitamin E 4200-20-3 MG-MG-UNIT CAPS, Take 1 tablet by mouth daily., Disp: 30 capsule, Rfl: 11 .  hydrOXYzine (VISTARIL) 25 MG capsule, Take 1 capsule (25 mg total) by mouth 3 (three) times daily as needed.,  Disp: 30 capsule, Rfl: 0 .  Multiple Vitamins-Minerals (MULTIVITAMIN WITH MINERALS) tablet, TAKE 1 TABLET BY MOUTH ONCE DAILY, Disp: 30 tablet, Rfl: 11 .  vitamin B-12 (CYANOCOBALAMIN) 100 MCG tablet, TAKE 1 TABLET BY MOUTH ONCE DAILY FOR SUPPLEMENT, Disp: 30 tablet, Rfl: 11 .  amLODipine (NORVASC) 5 MG tablet, Take 1 tablet (5 mg total) by mouth daily., Disp: 90 tablet, Rfl: 3 .  loratadine (CLARITIN) 10 MG tablet, Take 1 tablet (10 mg total) by mouth daily as needed for allergies., Disp: 90 tablet, Rfl: 3 .  pravastatin (PRAVACHOL) 20 MG tablet, Take 1 tablet (20 mg total) by mouth daily., Disp: 90 tablet, Rfl: 3 .  triamcinolone cream (KENALOG) 0.1 %, Apply 1 application topically 2 (two) times daily. Behind the right ear as needed, Disp: 30 g, Rfl: 0  No Known Allergies  Review of Systems   Constitutional: Negative for chills, fever and malaise/fatigue.  Respiratory: Negative for cough and shortness of breath.   Cardiovascular: Negative for chest pain, palpitations and leg swelling.  Gastrointestinal: Negative for abdominal pain.  Musculoskeletal: Negative for joint pain and myalgias.  Skin: Negative for rash.  Neurological: Negative for dizziness and headaches.  Psychiatric/Behavioral: The patient is not nervous/anxious and does not have insomnia.       No other specific complaints in a complete review of systems (except as listed in HPI above).  Objective  Vitals:   04/23/19 1046  BP: 118/72  Pulse: (!) 101  Resp: 14  Temp: (!) 97.3 F (36.3 C)  SpO2: 93%  Weight: 154 lb 14.4 oz (70.3 kg)  Height: 5\' 2"  (1.575 m)     Body mass index is 28.33 kg/m.  Nursing Note and Vital Signs reviewed.  Physical Exam Vitals signs reviewed.  Constitutional:      Appearance: She is well-developed.  HENT:     Head: Normocephalic and atraumatic.  Neck:     Musculoskeletal: Normal range of motion and neck supple.     Vascular: No carotid bruit.  Cardiovascular:     Heart sounds: Normal heart sounds.     Comments: No pitting edema   Pulmonary:     Effort: Pulmonary effort is normal.     Breath sounds: Normal breath sounds.  Abdominal:     General: Bowel sounds are normal.     Palpations: Abdomen is soft.     Tenderness: There is no abdominal tenderness.  Musculoskeletal: Normal range of motion.  Skin:    General: Skin is warm and dry.     Capillary Refill: Capillary refill takes less than 2 seconds.     Comments: Dry flaky skin behind right ear  Neurological:     Mental Status: She is alert and oriented to person, place, and time.     GCS: GCS eye subscore is 4. GCS verbal subscore is 5. GCS motor subscore is 6.  Psychiatric:        Speech: Speech normal.        Behavior: Behavior normal.        Thought Content: Thought content normal.        Judgment:  Judgment normal.       No results found for this or any previous visit (from the past 48 hour(s)).  Assessment & Plan 1. Essential hypertension stalbe - amLODipine (NORVASC) 5 MG tablet; Take 1 tablet (5 mg total) by mouth daily.  Dispense: 90 tablet; Refill: 3  2. Dental caries See dentist   3. Osteoporosis without current pathological  fracture, unspecified osteoporosis type Calcium and vitamin d supplementation; will see dentist and discuss fosmax  4. Dyslipidemia - Lipid Profile - pravastatin (PRAVACHOL) 20 MG tablet; Take 1 tablet (20 mg total) by mouth daily.  Dispense: 90 tablet; Refill: 3  5. Intellectual disability stable  6. Overweight (BMI 25.0-29.9) Diet   7. Medication monitoring encounter - COMPLETE METABOLIC PANEL WITH GFR  8. Eczema, unspecified type - triamcinolone cream (KENALOG) 0.1 %; Apply 1 application topically 2 (two) times daily. Behind the right ear as needed  Dispense: 30 g; Refill: 0  9. Allergic state, subsequent encounter - loratadine (CLARITIN) 10 MG tablet; Take 1 tablet (10 mg total) by mouth daily as needed for allergies.  Dispense: 90 tablet; Refill: 3

## 2019-04-23 ENCOUNTER — Ambulatory Visit (INDEPENDENT_AMBULATORY_CARE_PROVIDER_SITE_OTHER): Payer: Medicare Other | Admitting: Nurse Practitioner

## 2019-04-23 ENCOUNTER — Other Ambulatory Visit: Payer: Self-pay

## 2019-04-23 ENCOUNTER — Encounter: Payer: Self-pay | Admitting: Nurse Practitioner

## 2019-04-23 VITALS — BP 118/72 | HR 96 | Temp 97.3°F | Resp 14 | Ht 62.0 in | Wt 154.9 lb

## 2019-04-23 DIAGNOSIS — K029 Dental caries, unspecified: Secondary | ICD-10-CM

## 2019-04-23 DIAGNOSIS — Z5181 Encounter for therapeutic drug level monitoring: Secondary | ICD-10-CM

## 2019-04-23 DIAGNOSIS — T7840XD Allergy, unspecified, subsequent encounter: Secondary | ICD-10-CM

## 2019-04-23 DIAGNOSIS — M810 Age-related osteoporosis without current pathological fracture: Secondary | ICD-10-CM

## 2019-04-23 DIAGNOSIS — L309 Dermatitis, unspecified: Secondary | ICD-10-CM

## 2019-04-23 DIAGNOSIS — E785 Hyperlipidemia, unspecified: Secondary | ICD-10-CM

## 2019-04-23 DIAGNOSIS — F79 Unspecified intellectual disabilities: Secondary | ICD-10-CM

## 2019-04-23 DIAGNOSIS — I10 Essential (primary) hypertension: Secondary | ICD-10-CM | POA: Diagnosis not present

## 2019-04-23 DIAGNOSIS — E663 Overweight: Secondary | ICD-10-CM

## 2019-04-23 MED ORDER — AMLODIPINE BESYLATE 5 MG PO TABS: 5.0000 mg | ORAL_TABLET | Freq: Every day | ORAL | 3 refills | Status: DC

## 2019-04-23 MED ORDER — PRAVASTATIN SODIUM 20 MG PO TABS: 20.0000 mg | ORAL_TABLET | Freq: Every day | ORAL | 3 refills | Status: DC

## 2019-04-23 MED ORDER — TRIAMCINOLONE ACETONIDE 0.1 % EX CREA: 1.0000 "application " | TOPICAL_CREAM | Freq: Two times a day (BID) | CUTANEOUS | 0 refills | Status: DC

## 2019-04-23 MED ORDER — LORATADINE 10 MG PO TABS: 10.0000 mg | ORAL_TABLET | Freq: Every day | ORAL | 3 refills | Status: DC | PRN

## 2019-04-23 NOTE — Patient Instructions (Signed)
We will hold fosamax until she has a dental appointment. Please ask dentist to see if they think she should not take it due to dental disease.

## 2019-06-09 ENCOUNTER — Other Ambulatory Visit: Payer: Self-pay

## 2019-06-09 ENCOUNTER — Ambulatory Visit (INDEPENDENT_AMBULATORY_CARE_PROVIDER_SITE_OTHER): Payer: Medicare Other

## 2019-06-09 DIAGNOSIS — Z23 Encounter for immunization: Secondary | ICD-10-CM | POA: Diagnosis not present

## 2019-06-17 ENCOUNTER — Encounter: Payer: Self-pay | Admitting: Family Medicine

## 2019-06-17 ENCOUNTER — Other Ambulatory Visit: Payer: Self-pay

## 2019-06-17 ENCOUNTER — Ambulatory Visit (INDEPENDENT_AMBULATORY_CARE_PROVIDER_SITE_OTHER): Payer: Medicare Other | Admitting: Family Medicine

## 2019-06-17 VITALS — BP 112/76 | HR 86 | Temp 97.5°F | Resp 14 | Wt 154.6 lb

## 2019-06-17 DIAGNOSIS — N1831 Chronic kidney disease, stage 3a: Secondary | ICD-10-CM | POA: Diagnosis not present

## 2019-06-17 DIAGNOSIS — I10 Essential (primary) hypertension: Secondary | ICD-10-CM | POA: Diagnosis not present

## 2019-06-17 DIAGNOSIS — K029 Dental caries, unspecified: Secondary | ICD-10-CM | POA: Diagnosis not present

## 2019-06-17 DIAGNOSIS — E785 Hyperlipidemia, unspecified: Secondary | ICD-10-CM

## 2019-06-17 DIAGNOSIS — F419 Anxiety disorder, unspecified: Secondary | ICD-10-CM

## 2019-06-17 DIAGNOSIS — Z5181 Encounter for therapeutic drug level monitoring: Secondary | ICD-10-CM

## 2019-06-17 DIAGNOSIS — R600 Localized edema: Secondary | ICD-10-CM

## 2019-06-17 MED ORDER — LISINOPRIL-HYDROCHLOROTHIAZIDE 10-12.5 MG PO TABS: 0.5000 | ORAL_TABLET | Freq: Every day | ORAL | 1 refills | Status: DC

## 2019-06-17 MED ORDER — HYDROXYZINE PAMOATE 25 MG PO CAPS: 25.0000 mg | ORAL_CAPSULE | Freq: Three times a day (TID) | ORAL | 0 refills | Status: DC | PRN

## 2019-06-17 NOTE — Progress Notes (Signed)
Name: Tonya Lawson   MRN: [id number]    DOB: [birth year]   Date:06/17/2019       Progress Note  Chief Complaint  Patient presents with  . Hypertension     Subjective:   Tonya Lawson is a 58 y.o. female, presents to clinic for routine follow up on the conditions listed above.  Presents for multiple high BP readings at home and at the dentist office.  Here in office BP is well controlled.  Pt needs some dental work and unable to get done due to her abnormal VS.   BP readings at the dentist were 150/100 - pt does nod her head stating that she is very afraid to go to the dentist  Hypertension:  Currently managed on norvasc 5 mg Pt reports good med compliance - limited hx, pt nonverbal, caretaker here today denies any hypotension, syncope. Blood pressure today is well controlled.  They do monitor at home and it is usually 110-130/70-80 BP Readings from Last 3 Encounters:  04/23/19 118/72  01/20/19 109/82  01/08/19 121/77  They are concerned about some lower extremity edema and they are making her elevate her legs she does complain about pain in her left leg, there is no pitting edema here today no redness or asymmetry between her legs He does eat a healthy diet and low-salt, unfortunately enjoying the group home she does eat more processed foods than what [place] used [place] and most of her chronic conditions have come for this diet change   Hyperlipidemia: Current Medication Regimen: Pravastatin 20 mg daily Last Lipids: Lab Results  Component Value Date   CHOL 165 04/09/2018   HDL 60 04/09/2018   LDLCALC 77 04/09/2018   TRIG 188 (H) 04/09/2018   CHOLHDL 2.8 04/09/2018   - Current Diet: Healthy diet but sometimes processed foods or snacks - : Chest pain, shortness of breath, myalgias -patient has never complained of this - Documented aortic atherosclerosis? No - Risk factors for atherosclerosis: hypercholesterolemia and hypertension    Will do follow up virtual visit for BP follow up - [place] - group home  House # where  [phone] ask to speak with Christal   Patient Active Problem List   Diagnosis Date Noted  . Dyslipidemia 10/10/2018  . Dental caries 10/10/2018  . Overweight (BMI 25.0-29.9) 02/01/2018  . Colon cancer screening 08/29/2017  . DNR (do not resuscitate) 08/29/2017  . Hypertension   . Osteoporosis   . Intellectual disability   . Kyphosis of thoracic region     Past Surgical History:  Procedure Laterality Date  . ABDOMINAL HYSTERECTOMY     completed    Family History  Problem Relation Age of Onset  . Diabetes Mother   . Stroke Mother   . Cancer Father        brain  . Breast cancer Maternal Aunt 70  . Cancer Brother     Social History   Socioeconomic History  . Marital status: Unknown    Spouse name: Not on file  . Number of children: Not on file  . Years of education: Not on file  . Highest education level: Not on file  Occupational History  . Not on file  Social Needs  . Financial resource strain: Not hard at all  . Food insecurity    Worry: Never true    Inability: Never true  . Transportation needs    Medical: No  Non-medical: No  Tobacco Use  . Smoking status: Never Smoker  . Smokeless tobacco: Never Used  Substance and Sexual Activity  . Alcohol use: No  . Drug use: No  . Sexual activity: Never  Lifestyle  . Physical activity    Days per week: 7 days    Minutes per session: 20 min  . Stress: Not at all  Relationships  . Social connections    Talks on phone: More than three times a week    Gets together: More than three times a week    Attends religious service: Never    Active member of club or organization: No    Attends meetings of clubs or organizations: Never    Relationship status: Never married  . Intimate partner violence    Fear of current or ex partner: No    Emotionally abused: No    Physically abused: No    Forced  sexual activity: No  Other Topics Concern  . Not on file  Social History Narrative  . Not on file     Current Outpatient Medications:  .  acetaminophen (TYLENOL) 500 MG tablet, Take 1 tablet (500 mg total) by mouth every 6 (six) hours as needed., Disp: 30 tablet, Rfl: 0 .  amLODipine (NORVASC) 5 MG tablet, Take 1 tablet (5 mg total) by mouth daily., Disp: 90 tablet, Rfl: 3 .  ASPIRIN LOW DOSE 81 MG EC tablet, TAKE 1 TABLET BY MOUTH ONCE DAILY, Disp: 30 tablet, Rfl: 11 .  Calcium Carbonate-Vitamin D 600-400 MG-UNIT tablet, TAKE 1 TABLET BY MOUTH TWICE DAILY, Disp: 60 tablet, Rfl: 11 .  Cranberry-Vitamin C-Vitamin E 4200-20-3 MG-MG-UNIT CAPS, Take 1 tablet by mouth daily., Disp: 30 capsule, Rfl: 11 .  hydrOXYzine (VISTARIL) 25 MG capsule, Take 1 capsule (25 mg total) by mouth 3 (three) times daily as needed., Disp: 30 capsule, Rfl: 0 .  loratadine (CLARITIN) 10 MG tablet, Take 1 tablet (10 mg total) by mouth daily as needed for allergies., Disp: 90 tablet, Rfl: 3 .  Multiple Vitamins-Minerals (MULTIVITAMIN WITH MINERALS) tablet, TAKE 1 TABLET BY MOUTH ONCE DAILY, Disp: 30 tablet, Rfl: 11 .  pravastatin (PRAVACHOL) 20 MG tablet, Take 1 tablet (20 mg total) by mouth daily., Disp: 90 tablet, Rfl: 3 .  triamcinolone cream (KENALOG) 0.1 %, Apply 1 application topically 2 (two) times daily. Behind the right ear as needed, Disp: 30 g, Rfl: 0 .  vitamin B-12 (CYANOCOBALAMIN) 100 MCG tablet, TAKE 1 TABLET BY MOUTH ONCE DAILY FOR SUPPLEMENT, Disp: 30 tablet, Rfl: 11  No Known Allergies  I personally reviewed active problem list, medication list, allergies, family history, social history, health maintenance, notes from last encounter, lab results, imaging with the patient/caregiver today.  Review of Systems  Unable to perform ROS: Patient nonverbal (here with caretaker from group home, ROS neg per her)  Constitutional: Negative.   HENT: Negative.   Eyes: Negative.   Respiratory: Negative.    Cardiovascular: Negative.   Gastrointestinal: Negative.   Endocrine: Negative.   Genitourinary: Negative.   Musculoskeletal: Negative.   Skin: Negative.   Allergic/Immunologic: Negative.   Neurological: Negative.   Hematological: Negative.   Psychiatric/Behavioral: Negative.   All other systems reviewed and are negative.    Objective:    Vitals:   06/17/19 1104  Pulse: 86  Resp: 14  Temp: (!) 97.5 F (36.4 C)  SpO2: 94%  Weight: 154 lb 9.6 oz (70.1 kg)    Body mass index is 28.28 kg/m.  Physical  Exam Vitals signs and nursing note reviewed.  Constitutional:      General: She is not in acute distress.    Appearance: Normal appearance. She is well-developed. She is not ill-appearing, toxic-appearing or diaphoretic.     Interventions: Face mask in place.  HENT:     Head: Normocephalic and atraumatic.     Right Ear: External ear normal.     Left Ear: External ear normal.  Eyes:     General: Lids are normal. No scleral icterus.       Right eye: No discharge.        Left eye: No discharge.     Conjunctiva/sclera: Conjunctivae normal.  Neck:     Musculoskeletal: Normal range of motion and neck supple.     Trachea: Phonation normal. No tracheal deviation.  Cardiovascular:     Rate and Rhythm: Normal rate and regular rhythm.     Chest Wall: PMI is not displaced.     Pulses: Normal pulses.          Radial pulses are 2+ on the right side and 2+ on the left side.       Posterior tibial pulses are 2+ on the right side and 2+ on the left side.     Heart sounds: Normal heart sounds. No murmur. No friction rub. No gallop.      Comments: No pitting lower extremity edema, bilateral lower extremities do have visible adipose tissue, but no induration of the skin, rash or pitting edema, appear symmetrical Pulmonary:     Effort: Pulmonary effort is normal. No respiratory distress.     Breath sounds: Normal breath sounds. No stridor. No wheezing, rhonchi or rales.  Chest:      Chest wall: No tenderness.  Abdominal:     General: Bowel sounds are normal. There is no distension.     Palpations: Abdomen is soft.     Tenderness: There is no abdominal tenderness. There is no guarding or rebound.  Musculoskeletal: Normal range of motion.        General: No deformity.     Right lower leg: No edema.     Left lower leg: No edema.  Lymphadenopathy:     Cervical: No cervical adenopathy.  Skin:    General: Skin is warm and dry.     Capillary Refill: Capillary refill takes less than 2 seconds.     Coloration: Skin is not jaundiced or pale.     Findings: No rash.  Neurological:     Mental Status: She is alert and oriented to person, place, and time.     Motor: No abnormal muscle tone.     Gait: Gait normal.  Psychiatric:        Behavior: Behavior is cooperative.     Comments: Patient responds to questions by nodding No verbal answers Appears nervous but very cooperative and pleasant      PHQ2/9: Depression screen [facility] 2/9 06/17/2019 04/23/2019 01/08/2019 10/10/2018 10/10/2018  Decreased Interest 0 0 0 0 0  Down, Depressed, Hopeless 0 0 0 0 0  PHQ - 2 Score 0 0 0 0 0  Altered sleeping 0 0 0 0 0  Tired, decreased energy 0 0 0 0 0  Change in appetite 0 0 0 0 0  Feeling bad or failure about yourself  0 0 0 0 0  Trouble concentrating 0 0 0 0 0  Moving slowly or fidgety/restless 0 0 0 0 0  Suicidal thoughts 0 0 0 0  0  PHQ-9 Score 0 0 0 0 0  Difficult doing work/chores - Not difficult at all Not difficult at all Not difficult at all Not difficult at all  Some recent data might be hidden    phq 9 is negative   Fall Risk: Fall Risk  06/17/2019 04/23/2019 01/20/2019 01/08/2019 10/10/2018  Falls in the past year? 0 0 0 0 1  Number falls in past yr: 0 0 - 0 1  Injury with Fall? 0 0 - 0 1  Risk for fall due to : - - - - History of fall(s)      Functional Status Survey: Is the patient deaf or have difficulty hearing?: No Does the patient have difficulty seeing, even  when wearing glasses/contacts?: No Does the patient have difficulty concentrating, remembering, or making decisions?: Yes Does the patient have difficulty walking or climbing stairs?: Yes Does the patient have difficulty dressing or bathing?: Yes Does the patient have difficulty doing errands alone such as visiting a doctor's office or shopping?: Yes    Assessment & Plan:   1. Essential hypertension Well controlled -will switch from amlodipine with possible side effect of lower extremity edema to setting renal protective taking half pill of lisinopril hydrochlorothiazide continue to monitor at home Elevated readings I suspect are due to anxiety or stress especially the dentist letter was written to encourage proceeding with dental procedures that are required with blood pressures that are less than 160/100 - COMPLETE METABOLIC PANEL WITH GFR - Microalbumin / creatinine urine ratio - lisinopril-hydrochlorothiazide (ZESTORETIC) 10-12.5 MG tablet; Take 0.5 tablets by mouth daily.  Dispense: 30 tablet; Refill: 1  2. Dyslipidemia Compliant with medications no known side effects will recheck labs today - COMPLETE METABOLIC PANEL WITH GFR - Lipid panel  3. Stage 3a chronic kidney disease Stopping amlodipine and starting lisinopril for renal protection - COMPLETE METABOLIC PANEL WITH GFR - Microalbumin / creatinine urine ratio  4. Dental caries Letter to dentist to hopefully help her have needed treatment done  5. Bilateral lower extremity edema Does not really appear like dependent edema patient appears euvolemic does not appear fluid overloaded the enlarged lower extremities does appear to be consistent with adipose tissue deposits - COMPLETE METABOLIC PANEL WITH GFR - TSH - CBC with Differential/Platelet - Microalbumin / creatinine urine ratio  6. Medication monitoring encounter - COMPLETE METABOLIC PANEL WITH GFR - Lipid panel - TSH - CBC with Differential/Platelet -  Microalbumin / creatinine urine ratio  7. Anxiety Has had before, uses with agitated, anxious behavior, suggested trying for dental procedure to see if it will help her be able to have work done and not have very elevated blood pressure - hydrOXYzine (VISTARIL) 25 MG capsule; Take 1 capsule (25 mg total) by mouth every 8 (eight) hours as needed for anxiety (anxiety or aggitation, can use before going to dentist).  Dispense: 30 capsule; Refill: 0    Return in about 3 weeks (around 07/08/2019) for virtual f/up visit with BP recheck .   Delsa Grana, PA-C 06/17/19 11:09 AM

## 2019-06-17 NOTE — Patient Instructions (Signed)
Stop amlodipine 5 mg daily  Start 1/2 pill of lisinopril-HCTZ 10-12.5 mg daily in the am  (total daily dose 5-6.25 mg)   Can use vistaril as prescribed for anxiety or before dentist appointments.  AS long as patient does well with medicine, meaning it effectivey helps calm her down, I would try before her next dentist appointment.  Happy to provide a note to the dentist as well.  In a few days start monitoring twice a day at consistent times - keep track in a BP log  Please call and notify us if BP is <100/60 or >150/90 for more than 2-3 readings in a row.  Make sure to check BP when patient is calm and relaxed.     How to Take Your Blood Pressure You can take your blood pressure at home with a machine. You may need to check your blood pressure at home:  To check if you have high blood pressure (hypertension).  To check your blood pressure over time.  To make sure your blood pressure medicine is working. Supplies needed: You will need a blood pressure machine, or monitor. You can buy one at a drugstore or online. When choosing one:  Choose one with an arm cuff.  Choose one that wraps around your upper arm. Only one finger should fit between your arm and the cuff.  Do not choose one that measures your blood pressure from your wrist or finger. Your doctor can suggest a monitor. How to prepare Avoid these things for 30 minutes before checking your blood pressure:  Drinking caffeine.  Drinking alcohol.  Eating.  Smoking.  Exercising. Five minutes before checking your blood pressure:  Pee.  Sit in a dining chair. Avoid sitting in a soft couch or armchair.  Be quiet. Do not talk. How to take your blood pressure Follow the instructions that came with your machine. If you have a digital blood pressure monitor, these may be the instructions: 1. Sit up straight. 2. Place your feet on the floor. Do not cross your ankles or legs. 3. Rest your left arm at the level of your  heart. You may rest it on a table, desk, or chair. 4. Pull up your shirt sleeve. 5. Wrap the blood pressure cuff around the upper part of your left arm. The cuff should be 1 inch (2.5 cm) above your elbow. It is best to wrap the cuff around bare skin. 6. Fit the cuff snugly around your arm. You should be able to place only one finger between the cuff and your arm. 7. Put the cord inside the groove of your elbow. 8. Press the power button. 9. Sit quietly while the cuff fills with air and loses air. 10. Write down the numbers on the screen. 11. Wait 2-3 minutes and then repeat steps 1-10. What do the numbers mean? Two numbers make up your blood pressure. The first number is called systolic pressure. The second is called diastolic pressure. An example of a blood pressure reading is "120 over 80" (or 120/80). If you are an adult and do not have a medical condition, use this guide to find out if your blood pressure is normal: Normal  First number: below 120.  Second number: below 80. Elevated  First number: 120-129.  Second number: below 80. Hypertension stage 1  First number: 130-139.  Second number: 80-89. Hypertension stage 2  First number: 140 or above.  Second number: 44 or above. Your blood pressure is above normal even if  only the top or bottom number is above normal. Follow these instructions at home:  Check your blood pressure as often as your doctor tells you to.  Take your monitor to your next doctor's appointment. Your doctor will: ? Make sure you are using it correctly. ? Make sure it is working right.  Make sure you understand what your blood pressure numbers should be.  Tell your doctor if your medicines are causing side effects. Contact a doctor if:  Your blood pressure keeps being high. Get help right away if:  Your first blood pressure number is higher than 180.  Your second blood pressure number is higher than 120. This information is not intended to  replace advice given to you by your health care provider. Make sure you discuss any questions you have with your health care provider. Document Released: 08/10/2008 Document Revised: 08/10/2017 Document Reviewed: 02/04/2016 Elsevier Patient Education  2020 [employer].

## 2019-06-18 LAB — LIPID PANEL
Cholesterol: 161 mg/dL (ref <200)
HDL: 63 mg/dL (ref >=50)
LDL Cholesterol (Calc): 76 mg/dL (calc)
Non-HDL Cholesterol (Calc): 98 mg/dL (calc) (ref <130)
Total CHOL/HDL Ratio: 2.6 (calc) (ref <5.0)
Triglycerides: 136 mg/dL (ref <150)

## 2019-06-18 LAB — COMPLETE METABOLIC PANEL WITH GFR
AG Ratio: 1.6 (calc) (ref 1.0–2.5)
ALT: 15 U/L (ref 6–29)
AST: 15 U/L (ref 10–35)
Albumin: 4.4 g/dL (ref 3.6–5.1)
Alkaline phosphatase (APISO): 76 U/L (ref 37–153)
BUN: 15 mg/dL (ref 7–25)
CO2: 28 mmol/L (ref 20–32)
Calcium: 10.4 mg/dL (ref 8.6–10.4)
Chloride: 101 mmol/L (ref 98–110)
Creat: 0.83 mg/dL (ref 0.50–1.05)
GFR, Est African American: 90 mL/min/{1.73_m2} (ref >=60)
GFR, Est Non African American: 78 mL/min/{1.73_m2} (ref >=60)
Globulin: 2.8 g/dL (calc) (ref 1.9–3.7)
Glucose, Bld: 78 mg/dL (ref 65–99)
Potassium: 4.3 mmol/L (ref 3.5–5.3)
Sodium: 137 mmol/L (ref 135–146)
Total Bilirubin: 0.3 mg/dL (ref 0.2–1.2)
Total Protein: 7.2 g/dL (ref 6.1–8.1)

## 2019-06-18 LAB — CBC WITH DIFFERENTIAL/PLATELET
Absolute Monocytes: 469 cells/uL (ref 200–950)
Basophils Absolute: 83 cells/uL (ref 0–200)
Basophils Relative: 0.9 %
Eosinophils Absolute: 147 cells/uL (ref 15–500)
Eosinophils Relative: 1.6 %
HCT: 45.4 % — ABNORMAL HIGH (ref 35.0–45.0)
Hemoglobin: 14.5 g/dL (ref 11.7–15.5)
Lymphs Abs: 3542 cells/uL (ref 850–3900)
MCH: 27.6 pg (ref 27.0–33.0)
MCHC: 31.9 g/dL — ABNORMAL LOW (ref 32.0–36.0)
MCV: 86.5 fL (ref 80.0–100.0)
MPV: 10.5 fL (ref 7.5–12.5)
Monocytes Relative: 5.1 %
Neutro Abs: 4959 cells/uL (ref 1500–7800)
Neutrophils Relative %: 53.9 %
Platelets: 316 10*3/uL (ref 140–400)
RBC: 5.25 10*6/uL — ABNORMAL HIGH (ref 3.80–5.10)
RDW: 13.8 % (ref 11.0–15.0)
Total Lymphocyte: 38.5 %
WBC: 9.2 10*3/uL (ref 3.8–10.8)

## 2019-06-18 LAB — TSH: TSH: 2.06 mIU/L (ref 0.40–4.50)

## 2019-06-24 ENCOUNTER — Other Ambulatory Visit: Payer: Self-pay | Admitting: Family Medicine

## 2019-06-30 ENCOUNTER — Other Ambulatory Visit: Payer: Self-pay | Admitting: Family Medicine

## 2019-06-30 DIAGNOSIS — Z1231 Encounter for screening mammogram for malignant neoplasm of breast: Secondary | ICD-10-CM

## 2019-07-08 ENCOUNTER — Telehealth: Payer: Self-pay | Admitting: Family Medicine

## 2019-07-08 NOTE — Telephone Encounter (Signed)
Pt's caretaker, Letta Median, calling.  States that she needs orders to discontinue the old blood pressure medication and start the new medication that she was given. Letta Median states that [facility] needs to know so that Letta Median can carry medication back and have this repackaged.

## 2019-07-10 NOTE — Telephone Encounter (Signed)
Faxed to [facility]

## 2019-09-30 ENCOUNTER — Encounter: Payer: Self-pay | Admitting: Family Medicine

## 2019-09-30 ENCOUNTER — Ambulatory Visit (INDEPENDENT_AMBULATORY_CARE_PROVIDER_SITE_OTHER): Payer: Medicare Other | Admitting: Family Medicine

## 2019-09-30 ENCOUNTER — Other Ambulatory Visit: Payer: Self-pay

## 2019-09-30 DIAGNOSIS — L02416 Cutaneous abscess of left lower limb: Secondary | ICD-10-CM | POA: Diagnosis not present

## 2019-09-30 MED ORDER — SULFAMETHOXAZOLE-TRIMETHOPRIM 800-160 MG PO TABS: 1.0000 | ORAL_TABLET | Freq: Two times a day (BID) | ORAL | 0 refills | Status: DC

## 2019-09-30 NOTE — Progress Notes (Signed)
Name: Tonya Lawson   MRN: [id number]    DOB: September 19, 1960   Date:09/30/2019       Progress Note  Subjective  Chief Complaint  Chief Complaint  Patient presents with  . Mass    Left leg and it is the size of a quarter. Red, pain, and swelling. Onset has been in the past 4 day. She is unsure of how it got there.    I connected with  INELLA KENDAL  on 09/30/19 at 11:00 AM EST by a video enabled telemedicine application and verified that I am speaking with the correct person using two identifiers.  I discussed the limitations of evaluation and management by telemedicine and the availability of in person appointments. The patient expressed understanding and agreed to proceed. Staff also discussed with the patient that there may be a patient responsible charge related to this service. Patient Location: lobby  Provider Location: [facility] Additional Individuals present: caregiver Chrystal King  HPI  Left leg lesion: caregiver states she lives in a group home. She is not able to give the history and caregiver spoke for her. She may have been injured on Sunday when bathing her, red , tender, swollen spot on left leg. Started to ooze, she removes the bandage and seems to scratch the area. It seems to be getting bigger in size. No fever or chills. Nausea, vomiting or change in appetite.    Patient Active Problem List   Diagnosis Date Noted  . Dyslipidemia 10/10/2018  . Dental caries 10/10/2018  . Overweight (BMI 25.0-29.9) 02/01/2018  . Colon cancer screening 08/29/2017  . DNR (do not resuscitate) 08/29/2017  . Hypertension   . Osteoporosis   . Intellectual disability   . Kyphosis of thoracic region     Past Surgical History:  Procedure Laterality Date  . ABDOMINAL HYSTERECTOMY     completed    Family History  Problem Relation Age of Onset  . Diabetes Mother   . Stroke Mother   . Cancer Father        brain  . Breast cancer Maternal Aunt 70  . Cancer  Brother      Current Outpatient Medications:  .  acetaminophen (TYLENOL) 500 MG tablet, Take 1 tablet (500 mg total) by mouth every 6 (six) hours as needed., Disp: 30 tablet, Rfl: 0 .  ASPIRIN LOW DOSE 81 MG EC tablet, TAKE 1 TABLET BY MOUTH ONCE DAILY, Disp: 30 tablet, Rfl: 11 .  Calcium Carbonate-Vitamin D 600-400 MG-UNIT tablet, TAKE 1 TABLET BY MOUTH TWICE DAILY, Disp: 60 tablet, Rfl: 11 .  Cranberry-Vitamin C-Vitamin E 4200-20-3 MG-MG-UNIT CAPS, Take 1 tablet by mouth daily., Disp: 30 capsule, Rfl: 11 .  hydrOXYzine (VISTARIL) 25 MG capsule, Take 1 capsule (25 mg total) by mouth every 8 (eight) hours as needed for anxiety (anxiety or aggitation, can use before going to dentist)., Disp: 30 capsule, Rfl: 0 .  lisinopril-hydrochlorothiazide (ZESTORETIC) 10-12.5 MG tablet, Take 0.5 tablets by mouth daily., Disp: 30 tablet, Rfl: 1 .  loratadine (CLARITIN) 10 MG tablet, Take 1 tablet (10 mg total) by mouth daily as needed for allergies., Disp: 90 tablet, Rfl: 3 .  Multiple Vitamins-Minerals (MULTIVITAMIN WITH MINERALS) tablet, TAKE 1 TABLET BY MOUTH ONCE DAILY, Disp: 30 tablet, Rfl: 11 .  pravastatin (PRAVACHOL) 20 MG tablet, Take 1 tablet (20 mg total) by mouth daily., Disp: 90 tablet, Rfl: 3 .  triamcinolone cream (KENALOG) 0.1 %, Apply 1 application topically 2 (two) times daily. Behind  the right ear as needed, Disp: 30 g, Rfl: 0 .  vitamin B-12 (CYANOCOBALAMIN) 100 MCG tablet, TAKE 1 TABLET BY MOUTH ONCE DAILY FOR SUPPLEMENT, Disp: 30 tablet, Rfl: 11  No Known Allergies  I personally reviewed active problem list, medication list, allergies, family history, social history, health maintenance with the patient/caregiver today.   ROS  Ten systems reviewed and is negative except as mentioned in HPI   Objective  Virtual encounter, vitals not obtained.  There is no height or weight on file to calculate BMI.  Physical Exam  Awake, alert and oriented  She was in the lobby and specimen  was collected, tender area of induration with oozing yellow material from the center  PHQ2/9: Depression screen [facility] 2/9 06/17/2019 04/23/2019 01/08/2019 10/10/2018 10/10/2018  Decreased Interest 0 0 0 0 0  Down, Depressed, Hopeless 0 0 0 0 0  PHQ - 2 Score 0 0 0 0 0  Altered sleeping 0 0 0 0 0  Tired, decreased energy 0 0 0 0 0  Change in appetite 0 0 0 0 0  Feeling bad or failure about yourself  0 0 0 0 0  Trouble concentrating 0 0 0 0 0  Moving slowly or fidgety/restless 0 0 0 0 0  Suicidal thoughts 0 0 0 0 0  PHQ-9 Score 0 0 0 0 0  Difficult doing work/chores - Not difficult at all Not difficult at all Not difficult at all Not difficult at all  Some recent data might be hidden   PHQ-2/9 Result is negative.    Fall Risk: Fall Risk  09/30/2019 06/17/2019 04/23/2019 01/20/2019 01/08/2019  Falls in the past year? 0 0 0 0 0  Number falls in past yr: 0 0 0 - 0  Injury with Fall? 0 0 0 - 0  Risk for fall due to : - - - - -     Assessment & Plan   1. Abscess of left lower leg  - sulfamethoxazole-trimethoprim (BACTRIM DS) 800-160 MG tablet; Take 1 tablet by mouth 2 (two) times daily.  Dispense: 14 tablet; Refill: 0  - Anaerobic and Aerobic Culture  Keep area clean, soak area with warm water, may add 1 tsp of clorox bleach per gallon of water  I discussed the assessment and treatment plan with the patient. The patient was provided an opportunity to ask questions and all were answered. The patient agreed with the plan and demonstrated an understanding of the instructions.  The patient was advised to call back or seek an in-person evaluation if the symptoms worsen or if the condition fails to improve as anticipated.  I provided 15  minutes of non-face-to-face time during this encounter.

## 2019-09-30 NOTE — Patient Instructions (Addendum)
Keep area clean, soak area with warm water, may add 1 tsp of clorox bleach per gallon of water  Skin Abscess  A skin abscess is an infected area on or under your skin that contains a collection of pus and other material. An abscess may also be called a furuncle, carbuncle, or boil. An abscess can occur in or on almost any part of your body. Some abscesses break open (rupture) on their own. Most continue to get worse unless they are treated. The infection can spread deeper into the body and eventually into your blood, which can make you feel ill. Treatment usually involves draining the abscess. What are the causes? An abscess occurs when germs, like bacteria, pass through your skin and cause an infection. This may be caused by:  A scrape or cut on your skin.  A puncture wound through your skin, including a needle injection or insect bite.  Blocked oil or sweat glands.  Blocked and infected hair follicles.  A cyst that forms beneath your skin (sebaceous cyst) and becomes infected. What increases the risk? This condition is more likely to develop in people who:  Have a weak body defense system (immune system).  Have diabetes.  Have dry and irritated skin.  Get frequent injections or use illegal IV drugs.  Have a foreign body in a wound, such as a splinter.  Have problems with their lymph system or veins. What are the signs or symptoms? Symptoms of this condition include:  A painful, firm bump under the skin.  A bump with pus at the top. This may break through the skin and drain. Other symptoms include:  Redness surrounding the abscess site.  Warmth.  Swelling of the lymph nodes (glands) near the abscess.  Tenderness.  A sore on the skin. How is this diagnosed? This condition may be diagnosed based on:  A physical exam.  Your medical history.  A sample of pus. This may be used to find out what is causing the infection.  Blood tests.  Imaging tests, such as an  ultrasound, CT scan, or MRI. How is this treated? A small abscess that drains on its own may not need treatment. Treatment for larger abscesses may include:  Moist heat or heat pack applied to the area several times a day.  A procedure to drain the abscess (incision and drainage).  Antibiotic medicines. For a severe abscess, you may first get antibiotics through an IV and then change to antibiotics by mouth. Follow these instructions at home: Medicines   Take over-the-counter and prescription medicines only as told by your health care provider.  If you were prescribed an antibiotic medicine, take it as told by your health care provider. Do not stop taking the antibiotic even if you start to feel better. Abscess care   If you have an abscess that has not drained, apply heat to the affected area. Use the heat source that your health care provider recommends, such as a moist heat pack or a heating pad. ? Place a towel between your skin and the heat source. ? Leave the heat on for 20-30 minutes. ? Remove the heat if your skin turns bright red. This is especially important if you are unable to feel pain, heat, or cold. You may have a greater risk of getting burned.  Follow instructions from your health care provider about how to take care of your abscess. Make sure you: ? Cover the abscess with a bandage (dressing). ? Change your dressing or  gauze as told by your health care provider. ? Wash your hands with soap and water before you change the dressing or gauze. If soap and water are not available, use hand sanitizer.  Check your abscess every day for signs of a worsening infection. Check for: ? More redness, swelling, or pain. ? More fluid or blood. ? Warmth. ? More pus or a bad smell. General instructions  To avoid spreading the infection: ? Do not share personal care items, towels, or hot tubs with others. ? Avoid making skin contact with other people.  Keep all follow-up visits  as told by your health care provider. This is important. Contact a health care provider if you have:  More redness, swelling, or pain around your abscess.  More fluid or blood coming from your abscess.  Warm skin around your abscess.  More pus or a bad smell coming from your abscess.  A fever.  Muscle aches.  Chills or a general ill feeling. Get help right away if you:  Have severe pain.  See red streaks on your skin spreading away from the abscess. Summary  A skin abscess is an infected area on or under your skin that contains a collection of pus and other material.  A small abscess that drains on its own may not need treatment.  Treatment for larger abscesses may include having a procedure to drain the abscess and taking an antibiotic. This information is not intended to replace advice given to you by your health care provider. Make sure you discuss any questions you have with your health care provider. Document Revised: 12/19/2018 Document Reviewed: 10/11/2017 Elsevier Patient Education  2020 [employer].

## 2019-10-07 LAB — ANAEROBIC AND AEROBIC CULTURE
MICRO NUMBER:: 10061508
MICRO NUMBER:: 10061509
SPECIMEN QUALITY:: ADEQUATE
SPECIMEN QUALITY:: ADEQUATE

## 2019-10-21 IMAGING — CR DG CHEST 2V
1 series · 2 of 2 positions shown · non-contrast
Comparison: None.

CLINICAL DATA: Cough, congestion, fatigue and body aches since
[REDACTED].

EXAM:
CHEST - 2 VIEW

[Series 1: dg chest 2 view · 0.14mm/px · 2 of 2 slices shown]
[im 1/2]
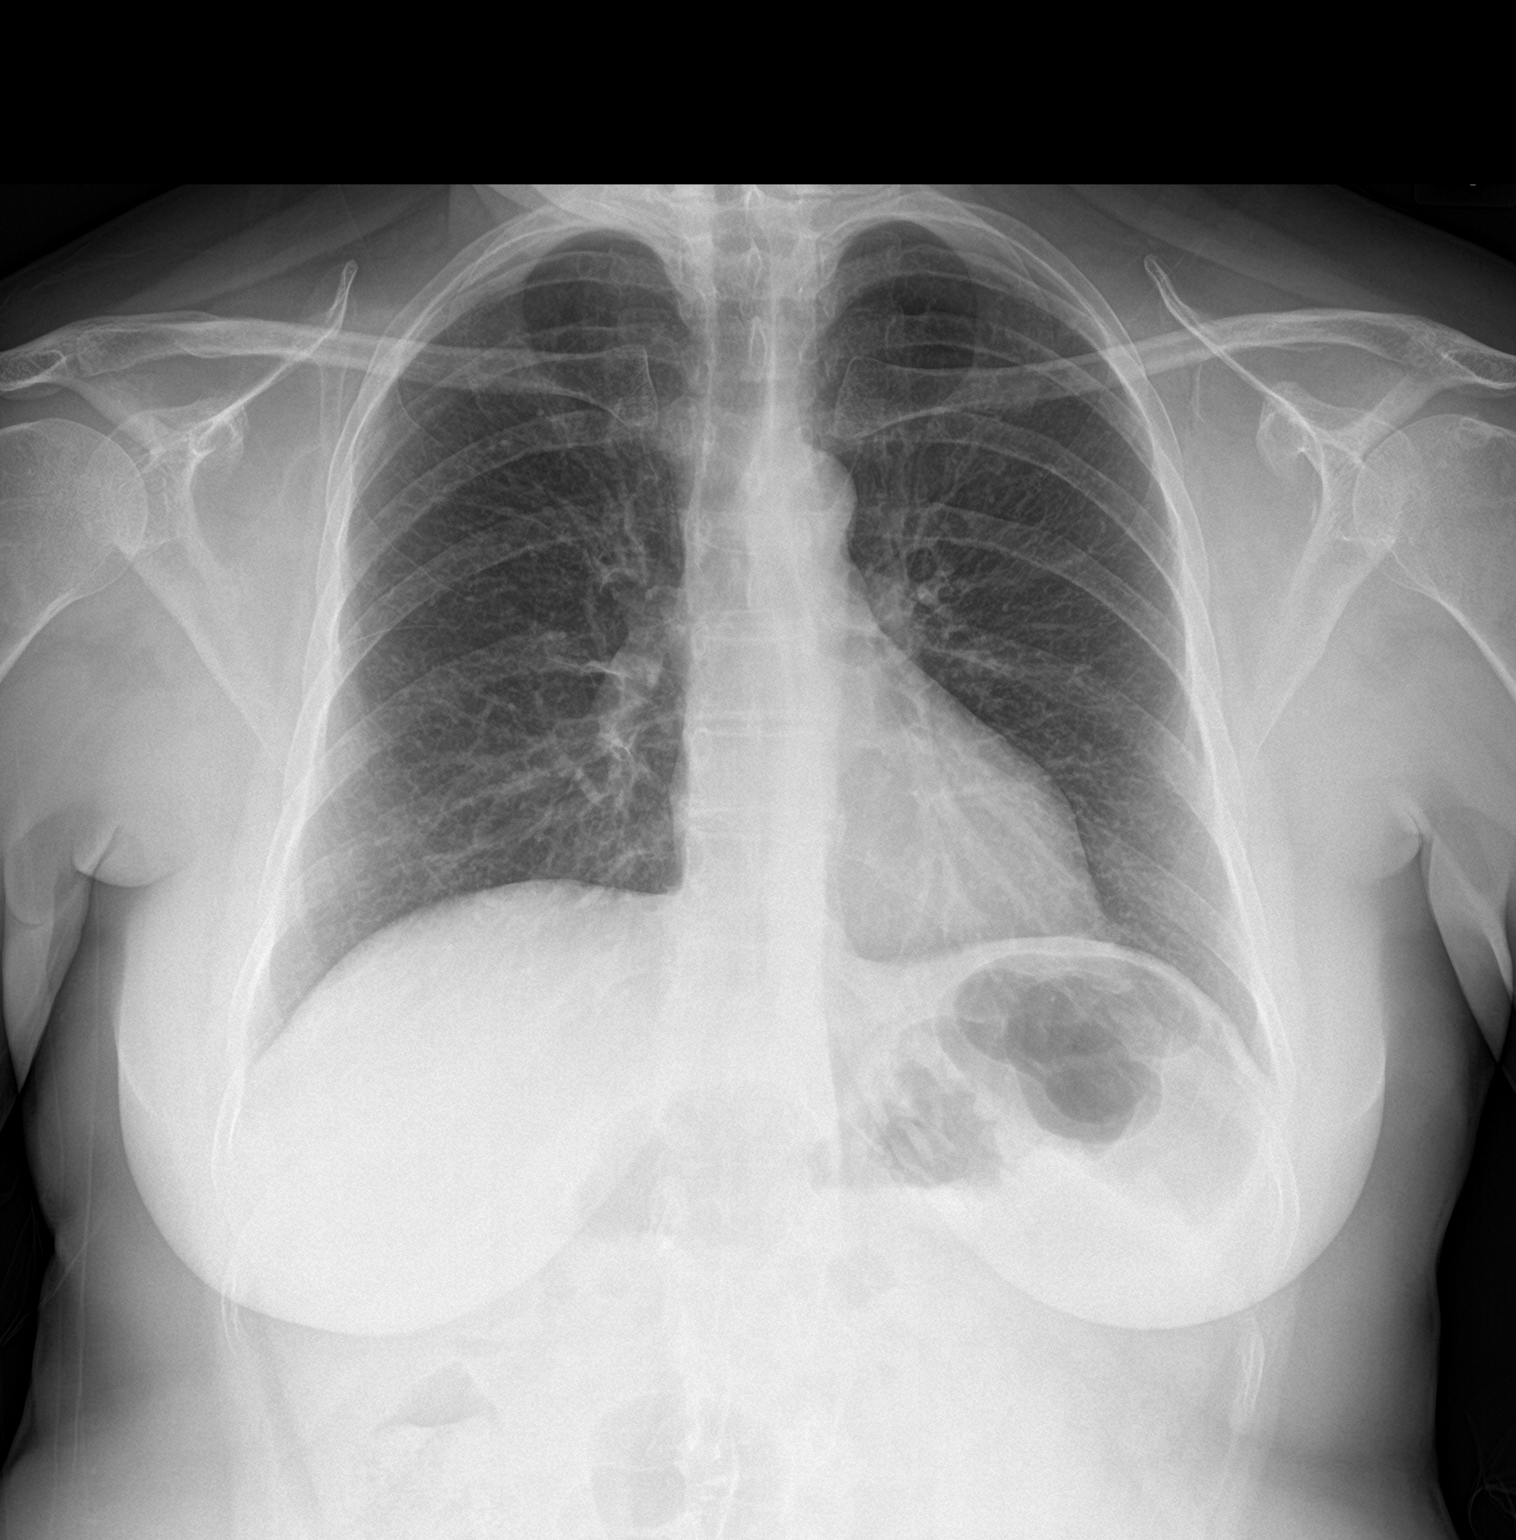
[im 2/2]
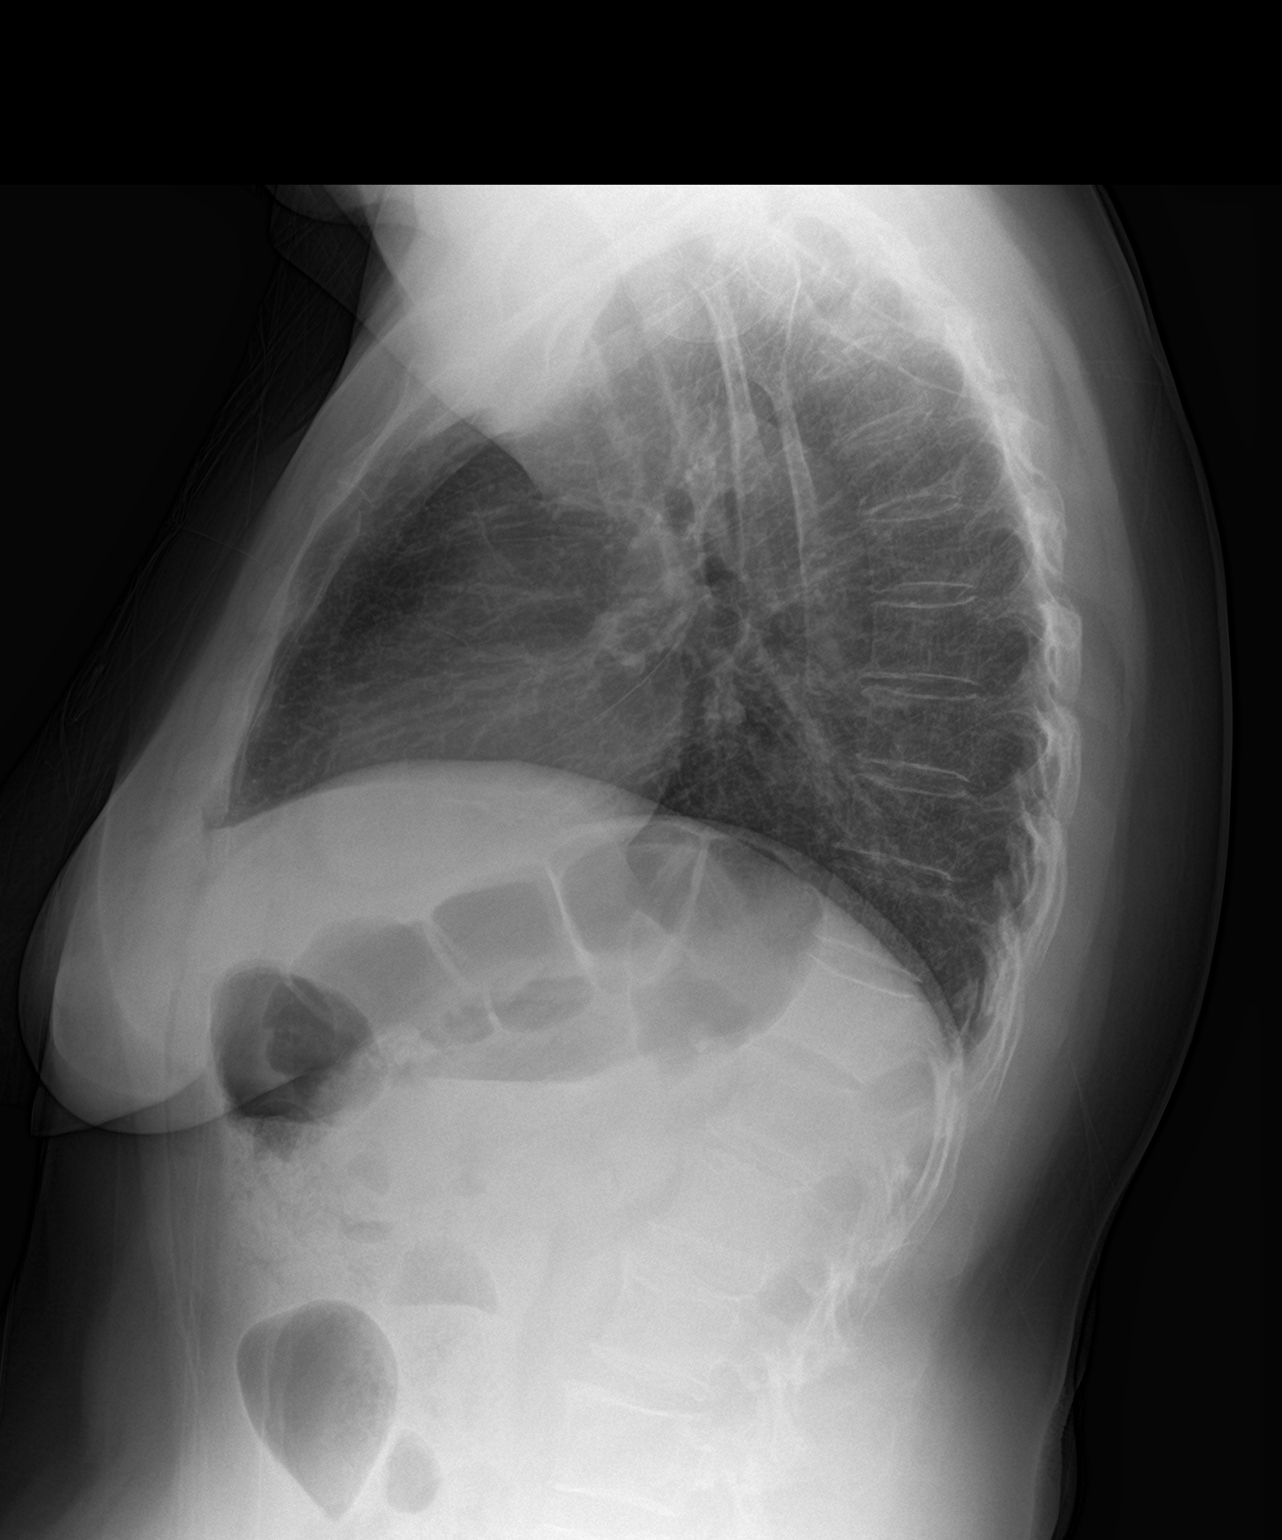

[2 of 2 positions shown; findings below may reference images not displayed]

FINDINGS: The heart size and mediastinal contours are within normal limits.
Both lungs are clear. The visualized skeletal structures are
unremarkable.
IMPRESSION: No active cardiopulmonary disease. No evidence of pneumonia or
pulmonary edema.

## 2019-10-22 ENCOUNTER — Other Ambulatory Visit: Payer: Self-pay

## 2019-10-22 NOTE — Patient Outreach (Signed)
[facility]) Care Management  10/22/2019  [name] 05/20/1961 [id number]   Medication Adherence call to Tonya Lawson spoke with patient family member she explain she is at a home facility at this time and they are providing with her medication. Tonya Lawson  Is showing past due on Pravastatin 20 mg and Lisinopril/Hctz 10/12.5 mg under [facility].   [facility] Direct Dial [phone]  Fax [phone] [name]

## 2019-10-24 ENCOUNTER — Encounter: Payer: Self-pay | Admitting: Family Medicine

## 2019-10-24 ENCOUNTER — Other Ambulatory Visit: Payer: Self-pay

## 2019-10-24 ENCOUNTER — Telehealth: Payer: Self-pay

## 2019-10-24 ENCOUNTER — Ambulatory Visit (INDEPENDENT_AMBULATORY_CARE_PROVIDER_SITE_OTHER): Payer: Medicare Other | Admitting: Family Medicine

## 2019-10-24 VITALS — BP 116/74 | HR 100 | Temp 98.5°F | Resp 14 | Ht 62.0 in | Wt 151.2 lb

## 2019-10-24 DIAGNOSIS — Z66 Do not resuscitate: Secondary | ICD-10-CM

## 2019-10-24 DIAGNOSIS — Z5181 Encounter for therapeutic drug level monitoring: Secondary | ICD-10-CM | POA: Diagnosis not present

## 2019-10-24 DIAGNOSIS — F79 Unspecified intellectual disabilities: Secondary | ICD-10-CM

## 2019-10-24 DIAGNOSIS — N1831 Chronic kidney disease, stage 3a: Secondary | ICD-10-CM | POA: Diagnosis not present

## 2019-10-24 DIAGNOSIS — E785 Hyperlipidemia, unspecified: Secondary | ICD-10-CM

## 2019-10-24 DIAGNOSIS — D75839 Thrombocytosis, unspecified: Secondary | ICD-10-CM

## 2019-10-24 DIAGNOSIS — D473 Essential (hemorrhagic) thrombocythemia: Secondary | ICD-10-CM

## 2019-10-24 DIAGNOSIS — I10 Essential (primary) hypertension: Secondary | ICD-10-CM

## 2019-10-24 DIAGNOSIS — R600 Localized edema: Secondary | ICD-10-CM

## 2019-10-24 DIAGNOSIS — E663 Overweight: Secondary | ICD-10-CM

## 2019-10-24 DIAGNOSIS — Z1211 Encounter for screening for malignant neoplasm of colon: Secondary | ICD-10-CM

## 2019-10-24 DIAGNOSIS — Z0000 Encounter for general adult medical examination without abnormal findings: Secondary | ICD-10-CM

## 2019-10-24 LAB — COMPLETE METABOLIC PANEL WITH GFR
AG Ratio: 1.5 (calc) (ref 1.0–2.5)
ALT: 10 U/L (ref 6–29)
AST: 14 U/L (ref 10–35)
Albumin: 4.2 g/dL (ref 3.6–5.1)
Alkaline phosphatase (APISO): 70 U/L (ref 37–153)
BUN: 17 mg/dL (ref 7–25)
CO2: 28 mmol/L (ref 20–32)
Calcium: 9.6 mg/dL (ref 8.6–10.4)
Chloride: 103 mmol/L (ref 98–110)
Creat: 0.9 mg/dL (ref 0.50–1.05)
GFR, Est African American: 82 mL/min/{1.73_m2} (ref >=60)
GFR, Est Non African American: 70 mL/min/{1.73_m2} (ref >=60)
Globulin: 2.8 g/dL (calc) (ref 1.9–3.7)
Glucose, Bld: 91 mg/dL (ref 65–99)
Potassium: 4.1 mmol/L (ref 3.5–5.3)
Sodium: 139 mmol/L (ref 135–146)
Total Bilirubin: 0.3 mg/dL (ref 0.2–1.2)
Total Protein: 7 g/dL (ref 6.1–8.1)

## 2019-10-24 LAB — CBC WITH DIFFERENTIAL/PLATELET
Absolute Monocytes: 587 cells/uL (ref 200–950)
Basophils Absolute: 80 cells/uL (ref 0–200)
Basophils Relative: 0.9 %
Eosinophils Absolute: 187 cells/uL (ref 15–500)
Eosinophils Relative: 2.1 %
HCT: 44.5 % (ref 35.0–45.0)
Hemoglobin: 14.6 g/dL (ref 11.7–15.5)
Lymphs Abs: 3667 cells/uL (ref 850–3900)
MCH: 28.3 pg (ref 27.0–33.0)
MCHC: 32.8 g/dL (ref 32.0–36.0)
MCV: 86.4 fL (ref 80.0–100.0)
MPV: 9.9 fL (ref 7.5–12.5)
Monocytes Relative: 6.6 %
Neutro Abs: 4379 cells/uL (ref 1500–7800)
Neutrophils Relative %: 49.2 %
Platelets: 370 10*3/uL (ref 140–400)
RBC: 5.15 10*6/uL — ABNORMAL HIGH (ref 3.80–5.10)
RDW: 14.1 % (ref 11.0–15.0)
Total Lymphocyte: 41.2 %
WBC: 8.9 10*3/uL (ref 3.8–10.8)

## 2019-10-24 LAB — LIPID PANEL
Cholesterol: 158 mg/dL (ref <200)
HDL: 61 mg/dL (ref >=50)
LDL Cholesterol (Calc): 71 mg/dL (calc)
Non-HDL Cholesterol (Calc): 97 mg/dL (calc) (ref <130)
Total CHOL/HDL Ratio: 2.6 (calc) (ref <5.0)
Triglycerides: 189 mg/dL — ABNORMAL HIGH (ref <150)

## 2019-10-24 NOTE — Patient Instructions (Signed)
  Tonya Lawson , Thank you for taking time to come for your Medicare Wellness Visit. I appreciate your ongoing commitment to your health goals. Please review the following plan we discussed and let me know if I can assist you in the future.   These are the goals we discussed: Goals    . Increase physical activity     Recommend increasing physical activity to at least 150 minutes per week        This is a list of the screening recommended for you and due dates:  Health Maintenance  Topic Date Due  . Colon Cancer Screening  06/09/2011  . Mammogram  07/23/2020  . Tetanus Vaccine  12/14/2027  . Flu Shot  Completed  .  Hepatitis C: One time screening is recommended by Center for Disease Control  (CDC) for  adults born from [birth year] through 1965.   Completed  . HIV Screening  Completed

## 2019-10-24 NOTE — Telephone Encounter (Signed)
Copied from [place] [phone]. Topic: General - Other >> Oct 24, 2019  9:45 AM Rainey Pines A wrote: Lenna Sciara from [facility] life services called to get an order for Zarbees elderberry immune support and Emergen-C daily immune support. Best contact [place], [place] [address]  Phone:  [phone] Fax:  [phone]

## 2019-10-24 NOTE — Progress Notes (Signed)
Name: Tonya Lawson   MRN: [id number]    DOB: [birth year]   Date:10/24/2019       Progress Note  Chief Complaint  Patient presents with  . Follow-up  . Hypertension  . Hyperlipidemia     Subjective:   Tonya Lawson is a 59 y.o. female, presents to clinic for routine follow up on the conditions listed above. They also are due for [place] which will be documented separately and was completed today  Hypertension:  Currently managed on lisinopril HCTZ - LE edema not changed after change from amlodipine about 4 months ago Pt reports good med compliance and denies any SE.  No lightheadedness, hypotension, syncope. Blood pressure today is well controlled. BP Readings from Last 3 Encounters:  10/24/19 116/74  06/17/19 112/76  04/23/19 118/72  Pt denies CP, SOB, exertional sx, LE edema, palpitation, Ha's, visual disturbances Dietary efforts for BP?  none   Hyperlipidemia: Current Medication Regimen:  on pravastatin 20 mg  Last Lipids: Lab Results  Component Value Date   CHOL 161 06/17/2019   HDL 63 06/17/2019   LDLCALC 76 06/17/2019   TRIG 136 06/17/2019   CHOLHDL 2.6 06/17/2019   - Denies: Chest pain, shortness of breath, myalgias. - Documented aortic atherosclerosis? No - Risk factors for atherosclerosis: hypercholesterolemia and hypertension   She is postmenopausal  Vit D normal with last labs and she is on supplement dx of osteoporosis - due for Bone Density April 2019 calcium and Vit D There are questions from the state about not having PAP done but chart states hx of TAH - will clarify with POA/mother and update No past surgical records available in this EMR  - have reviewed and searched.   Lives in a group home with her sister Is here with her caregiver today and her sister, caregiver reports they are very independent - not able to get them to do cologuard test because they cannot follow them to the bathroom and make them do it, too independent - would like to do  colonoscopy instead.  HM tab reviewed and updated  IDD, hx of anxiety and depression, moods stable without SSRI meds, cries with her sister about 1-2 x a week, they don't know why, but overall moods and behavior good.  They have hydroxyzine to take PRN for anxiety, I had previously give to them for use when going to the dentist 4 months ago, only used 1-2 x since then.    Health Maintenance  Topic Date Due  . COLONOSCOPY  06/09/2011  . MAMMOGRAM  07/23/2020  . TETANUS/TDAP  12/14/2027  . INFLUENZA VACCINE  Completed  . Hepatitis C Screening  Completed  . HIV Screening  Completed    Patient Active Problem List   Diagnosis Date Noted  . Dyslipidemia 10/10/2018  . Dental caries 10/10/2018  . Overweight (BMI 25.0-29.9) 02/01/2018  . Colon cancer screening 08/29/2017  . DNR (do not resuscitate) 08/29/2017  . Hypertension   . Osteoporosis   . Intellectual disability   . Kyphosis of thoracic region     Past Surgical History:  Procedure Laterality Date  . ABDOMINAL HYSTERECTOMY     completed    Family History  Problem Relation Age of Onset  . Diabetes Mother   . Stroke Mother   . Cancer Father        brain  . Breast cancer Maternal Aunt 70  . Cancer Brother     Social History   Tobacco Use  . Smoking  status: Never Smoker  . Smokeless tobacco: Never Used  Substance Use Topics  . Alcohol use: No  . Drug use: No      Current Outpatient Medications:  .  acetaminophen (TYLENOL) 500 MG tablet, Take 1 tablet (500 mg total) by mouth every 6 (six) hours as needed., Disp: 30 tablet, Rfl: 0 .  ASPIRIN LOW DOSE 81 MG EC tablet, TAKE 1 TABLET BY MOUTH ONCE DAILY, Disp: 30 tablet, Rfl: 11 .  Calcium Carbonate-Vitamin D 600-400 MG-UNIT tablet, TAKE 1 TABLET BY MOUTH TWICE DAILY, Disp: 60 tablet, Rfl: 11 .  Cranberry-Vitamin C-Vitamin E 4200-20-3 MG-MG-UNIT CAPS, Take 1 tablet by mouth daily., Disp: 30 capsule, Rfl: 11 .  hydrOXYzine (VISTARIL) 25 MG capsule, Take 1 capsule (25  mg total) by mouth every 8 (eight) hours as needed for anxiety (anxiety or aggitation, can use before going to dentist)., Disp: 30 capsule, Rfl: 0 .  lisinopril-hydrochlorothiazide (ZESTORETIC) 10-12.5 MG tablet, Take 0.5 tablets by mouth daily., Disp: 30 tablet, Rfl: 1 .  loratadine (CLARITIN) 10 MG tablet, Take 1 tablet (10 mg total) by mouth daily as needed for allergies., Disp: 90 tablet, Rfl: 3 .  Multiple Vitamins-Minerals (MULTIVITAMIN WITH MINERALS) tablet, TAKE 1 TABLET BY MOUTH ONCE DAILY, Disp: 30 tablet, Rfl: 11 .  pravastatin (PRAVACHOL) 20 MG tablet, Take 1 tablet (20 mg total) by mouth daily., Disp: 90 tablet, Rfl: 3 .  sulfamethoxazole-trimethoprim (BACTRIM DS) 800-160 MG tablet, Take 1 tablet by mouth 2 (two) times daily., Disp: 14 tablet, Rfl: 0 .  triamcinolone cream (KENALOG) 0.1 %, Apply 1 application topically 2 (two) times daily. Behind the right ear as needed, Disp: 30 g, Rfl: 0 .  vitamin B-12 (CYANOCOBALAMIN) 100 MCG tablet, TAKE 1 TABLET BY MOUTH ONCE DAILY FOR SUPPLEMENT, Disp: 30 tablet, Rfl: 11  No Known Allergies  Chart Review Today: I personally reviewed active problem list, medication list, allergies, family history, social history, health maintenance, notes from last encounter, lab results, imaging with the patient/caregiver today.   Review of Systems  Unable to perform ROS: Patient nonverbal (IDD)  Constitutional: Negative.   HENT: Negative.   Eyes: Negative.   Respiratory: Negative.   Cardiovascular: Negative.   Gastrointestinal: Negative.   Endocrine: Negative.   Genitourinary: Negative.   Musculoskeletal: Negative.   Skin: Negative.   Allergic/Immunologic: Negative.   Neurological: Negative.   Hematological: Negative.   Psychiatric/Behavioral: Negative.   All other systems reviewed and are negative.    Objective:    Vitals:   10/24/19 1030  BP: 116/74  Pulse: 100  Resp: 14  Temp: 98.5 F (36.9 C)  SpO2: 99%  Weight: 151 lb 3.2 oz  (68.6 kg)  Height: 5\' 2"  (1.575 m)    Body mass index is 27.65 kg/m.  Physical Exam Vitals and nursing note reviewed.  Constitutional:      General: She is not in acute distress.    Appearance: Normal appearance. She is well-developed. She is not ill-appearing, toxic-appearing or diaphoretic.     Interventions: Face mask in place.  HENT:     Head: Normocephalic and atraumatic.     Right Ear: External ear normal.     Left Ear: External ear normal.  Eyes:     General: Lids are normal. No scleral icterus.       Right eye: No discharge.        Left eye: No discharge.     Conjunctiva/sclera: Conjunctivae normal.  Neck:     Trachea: Phonation normal.  No tracheal deviation.  Cardiovascular:     Rate and Rhythm: Normal rate and regular rhythm.     Pulses: Normal pulses.          Radial pulses are 2+ on the right side and 2+ on the left side.     Heart sounds: Normal heart sounds. No murmur. No friction rub. No gallop.      Comments: Nonpitting b/l LE edema, unchanged  Pulmonary:     Effort: Pulmonary effort is normal. No respiratory distress.     Breath sounds: Normal breath sounds. No stridor. No wheezing, rhonchi or rales.  Chest:     Chest wall: No tenderness.  Abdominal:     General: Bowel sounds are normal. There is no distension.     Palpations: Abdomen is soft.     Tenderness: There is no abdominal tenderness. There is no guarding or rebound.  Musculoskeletal:        General: No deformity. Normal range of motion.     Cervical back: Normal range of motion and neck supple.     Right lower leg: Edema present.     Left lower leg: Edema present.  Lymphadenopathy:     Cervical: No cervical adenopathy.  Skin:    General: Skin is warm and dry.     Capillary Refill: Capillary refill takes less than 2 seconds.     Coloration: Skin is not jaundiced or pale.     Findings: No rash.  Neurological:     Mental Status: She is alert. Mental status is at baseline.     Sensory:  Sensation is intact.     Motor: Motor function is intact. No abnormal muscle tone.     Gait: Gait is intact. Gait normal.  Psychiatric:        Mood and Affect: Mood and affect normal.        Behavior: Behavior is cooperative.      PHQ2/9: Depression screen [facility] 2/9 06/17/2019 04/23/2019 01/08/2019 10/10/2018 10/10/2018  Decreased Interest 0 0 0 0 0  Down, Depressed, Hopeless 0 0 0 0 0  PHQ - 2 Score 0 0 0 0 0  Altered sleeping 0 0 0 0 0  Tired, decreased energy 0 0 0 0 0  Change in appetite 0 0 0 0 0  Feeling bad or failure about yourself  0 0 0 0 0  Trouble concentrating 0 0 0 0 0  Moving slowly or fidgety/restless 0 0 0 0 0  Suicidal thoughts 0 0 0 0 0  PHQ-9 Score 0 0 0 0 0  Difficult doing work/chores - Not difficult at all Not difficult at all Not difficult at all Not difficult at all  Some recent data might be hidden    phq 9 is neg, reviewed  Fall Risk: Fall Risk  10/24/2019 09/30/2019 06/17/2019 04/23/2019 01/20/2019  Falls in the past year? 0 0 0 0 0  Number falls in past yr: 0 0 0 0 -  Injury with Fall? 0 0 0 0 -  Risk for fall due to : - - - - -  Follow up Education provided;Falls evaluation completed - - - -    Functional Status Survey: Is the patient deaf or have difficulty hearing?: No Does the patient have difficulty seeing, even when wearing glasses/contacts?: No Does the patient have difficulty concentrating, remembering, or making decisions?: Yes Does the patient have difficulty walking or climbing stairs?: Yes Does the patient have difficulty dressing or bathing?: Yes Does  the patient have difficulty doing errands alone such as visiting a doctor's office or shopping?: Yes   Assessment & Plan:     ICD-10-CM   1. Essential hypertension  99991111 COMPLETE METABOLIC PANEL WITH GFR   stable, well controlled  2. Dyslipidemia  99991111 COMPLETE METABOLIC PANEL WITH GFR    Lipid panel   stable well controlled  3. Stage 3a chronic kidney disease  123XX123 COMPLETE METABOLIC  PANEL WITH GFR   recheck labs  4. Medication monitoring encounter  Z51.81 CBC with Differential/Platelet    COMPLETE METABOLIC PANEL WITH GFR    Lipid panel  5. Overweight (BMI 25.0-29.9)  E66.3   6. Encounter for Medicare annual wellness exam  Z00.00   7. DNR (do not resuscitate)  Z66    per [facility] for ACP/paperwork on file   8. Bilateral lower extremity edema  123XX123 COMPLETE METABOLIC PANEL WITH GFR   unchanged, low salt, compression stocking  9. Screening for malignant neoplasm of colon  Z12.11 Ambulatory referral to Gastroenterology  10. Thrombocytosis ([place]) Chronic D47.3 CBC with Differential/Platelet   recheck - past Dx     Return in about 3 months (around 01/21/2020) for Routine follow-up.   Delsa Grana, PA-C 10/24/19 11:19 AM

## 2019-10-24 NOTE — Progress Notes (Signed)
Subjective:   Tonya Lawson is a 59 y.o. female who presents for Medicare Annual (Subsequent) preventive examination.  No new medical concerns Review of Systems:  Review of Systems  Constitutional: Negative.   HENT: Negative.   Eyes: Negative.   Respiratory: Negative.   Cardiovascular: Negative.   Gastrointestinal: Negative.   Endocrine: Negative.   Genitourinary: Negative.   Musculoskeletal: Negative.   Skin: Negative.   Allergic/Immunologic: Negative.   Neurological: Negative.   Hematological: Negative.   Psychiatric/Behavioral: Negative.   All other systems reviewed and are negative.   Cardiac Risk Factors include: dyslipidemia;hypertension     Objective:     Vitals: BP 116/74   Pulse 100   Temp 98.5 F (36.9 C)   Resp 14   Ht 5\' 2"  (1.575 m)   Wt 151 lb 3.2 oz (68.6 kg)   SpO2 99%   BMI 27.65 kg/m   Body mass index is 27.65 kg/m.  Advanced Directives 10/24/2019 10/10/2018 04/09/2017 10/09/2016 04/07/2016 12/23/2015  Does Patient Have a Medical Advance Directive? Unable to assess, patient is non-responsive or altered mental status;Yes Yes No No No Yes  Type of Paramedic of [place];Out of facility DNR (pink MOST or yellow form);Living will Out of facility DNR (pink MOST or yellow form) - - - Living will  Copy of Healthcare Power of Attorney in Chart? - - - - - No - copy requested  Would patient like information on creating a medical advance directive? - - - - No - patient declined information -  Pre-existing out of facility DNR order (yellow form or pink MOST form) - Pink MOST form placed in chart (order not valid for inpatient use) - - - -    Tobacco Social History   Tobacco Use  Smoking Status Never Smoker  Smokeless Tobacco Never Used     Counseling given: Not Answered   Clinical Intake:  Pre-visit preparation completed: No  Pain : No/denies pain     BMI - recorded: 27.65 Nutritional Status: BMI 25 -29  Overweight Nutritional Risks: None Diabetes: No  How often do you need to have someone help you when you read instructions, pamphlets, or other written materials from your doctor or pharmacy?: 5 - Always  Interpreter Needed?: No  Comments: IDD, group care home Information entered by :: Lt  Past Medical History:  Diagnosis Date  . DNR (do not resuscitate) 08/29/2017  . Hypertension   . Kyphosis of thoracic region   . Mental retardation   . Osteoporosis    Past Surgical History:  Procedure Laterality Date  . ABDOMINAL HYSTERECTOMY     completed   Family History  Problem Relation Age of Onset  . Diabetes Mother   . Stroke Mother   . Cancer Father        brain  . Breast cancer Maternal Aunt 70  . Cancer Brother    Social History   Socioeconomic History  . Marital status: Unknown    Spouse name: Not on file  . Number of children: Not on file  . Years of education: Not on file  . Highest education level: Not on file  Occupational History  . Not on file  Tobacco Use  . Smoking status: Never Smoker  . Smokeless tobacco: Never Used  Substance and Sexual Activity  . Alcohol use: No  . Drug use: No  . Sexual activity: Never  Other Topics Concern  . Not on file  Social History Narrative  .  Not on file   Social Determinants of Health   Financial Resource Strain:   . Difficulty of Paying Living Expenses: Not on file  Food Insecurity:   . Worried About Charity fundraiser in the Last Year: Not on file  . Ran Out of Food in the Last Year: Not on file  Transportation Needs:   . Lack of Transportation (Medical): Not on file  . Lack of Transportation (Non-Medical): Not on file  Physical Activity:   . Days of Exercise per Week: Not on file  . Minutes of Exercise per Session: Not on file  Stress:   . Feeling of Stress : Not on file  Social Connections:   . Frequency of Communication with Friends and Family: Not on file  . Frequency of Social Gatherings with Friends  and Family: Not on file  . Attends Religious Services: Not on file  . Active Member of Clubs or Organizations: Not on file  . Attends Archivist Meetings: Not on file  . Marital Status: Not on file    Outpatient Encounter Medications as of 10/24/2019  Medication Sig  . acetaminophen (TYLENOL) 500 MG tablet Take 1 tablet (500 mg total) by mouth every 6 (six) hours as needed.  . ASPIRIN LOW DOSE 81 MG EC tablet TAKE 1 TABLET BY MOUTH ONCE DAILY  . Calcium Carbonate-Vitamin D 600-400 MG-UNIT tablet TAKE 1 TABLET BY MOUTH TWICE DAILY  . Cranberry-Vitamin C-Vitamin E 4200-20-3 MG-MG-UNIT CAPS Take 1 tablet by mouth daily.  . hydrOXYzine (VISTARIL) 25 MG capsule Take 1 capsule (25 mg total) by mouth every 8 (eight) hours as needed for anxiety (anxiety or aggitation, can use before going to dentist).  Marland Kitchen lisinopril-hydrochlorothiazide (ZESTORETIC) 10-12.5 MG tablet Take 0.5 tablets by mouth daily.  Marland Kitchen loratadine (CLARITIN) 10 MG tablet Take 1 tablet (10 mg total) by mouth daily as needed for allergies.  . Multiple Vitamins-Minerals (MULTIVITAMIN WITH MINERALS) tablet TAKE 1 TABLET BY MOUTH ONCE DAILY  . pravastatin (PRAVACHOL) 20 MG tablet Take 1 tablet (20 mg total) by mouth daily.  Marland Kitchen triamcinolone cream (KENALOG) 0.1 % Apply 1 application topically 2 (two) times daily. Behind the right ear as needed  . vitamin B-12 (CYANOCOBALAMIN) 100 MCG tablet TAKE 1 TABLET BY MOUTH ONCE DAILY FOR SUPPLEMENT  . sulfamethoxazole-trimethoprim (BACTRIM DS) 800-160 MG tablet Take 1 tablet by mouth 2 (two) times daily. (Patient not taking: Reported on 10/24/2019)   No facility-administered encounter medications on file as of 10/24/2019.    Activities of Daily Living In your present state of health, do you have any difficulty performing the following activities: 10/24/2019 09/30/2019  Hearing? N N  Vision? N N  Difficulty concentrating or making decisions? [name]  Walking or climbing stairs? Y N  Dressing or  bathing? Y Y  Doing errands, shopping? [name]  Preparing Food and eating ? Y -  Using the Toilet? N -  In the past six months, have you accidently leaked urine? N -  Do you have problems with loss of bowel control? N -  Managing your Medications? Y -  Managing your Finances? Y -  Housekeeping or managing your Housekeeping? Y -  Some recent data might be hidden    Patient Care Team: Delsa Grana, PA-C as PCP - General (Family Medicine) Clyde Canterbury, MD as Referring Physician (Otolaryngology)    Assessment:   This is a routine wellness examination for [place].  Exercise Activities and Dietary recommendations Current Exercise Habits: Home exercise  routine, Time (Minutes): 30, Frequency (Times/Week): 5, Weekly Exercise (Minutes/Week): 150  Goals    . Increase physical activity     Recommend increasing physical activity to at least 150 minutes per week        Fall Risk Fall Risk  10/24/2019 09/30/2019 06/17/2019 04/23/2019 01/20/2019  Falls in the past year? 0 0 0 0 0  Number falls in past yr: 0 0 0 0 -  Injury with Fall? 0 0 0 0 -  Risk for fall due to : - - - - -  Follow up Education provided;Falls evaluation completed - - - -   Is the patient's home free of loose throw rugs in walkways, pet beds, electrical cords, etc?   yes      Grab bars in the bathroom? yes      Handrails on the stairs?   yes      Adequate lighting?   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 Influenza,inj,Quad PF,6+ Mos 08/02/2015, 07/12/2016, 06/28/2017, 06/17/2018, 06/09/2019  . Pneumococcal Polysaccharide-23 06/23/2000,  07/24/2005  . Td 06/20/2005  . Tdap 12/13/2017  . Zoster 09/17/2013  Will due other pneumococcal when pt is 67  Qualifies for Shingles Vaccine?done in the past   Screening Tests Health Maintenance  Topic Date Due  . COLONOSCOPY  06/09/2011  . MAMMOGRAM  07/23/2020  . TETANUS/TDAP  12/14/2027  . INFLUENZA VACCINE  Completed  . Hepatitis C Screening  Completed  . HIV Screening  Completed    Cancer Screenings: Lung: Low Dose CT Chest recommended if Age 78-80 years, 30 pack-year currently smoking OR have quit w/in 15years. Patient does not qualify. Breast:  Up to date on Mammogram? Yes   Up to date of Bone Density/Dexa? Yes Colorectal: referred to GI  Additional Screenings: : Hepatitis C Screening: Done in the past     Plan:    I have personally reviewed and noted the following in the patient's chart:   . Medical and social history . Use of alcohol, tobacco or illicit drugs  . Current medications and supplements . Functional ability and status . Nutritional status . Physical activity . Advanced directives . List of other physicians . Hospitalizations, surgeries, and ER visits in previous 12 months . Vitals . Screenings to include cognitive, depression, and falls . Referrals and appointments  In addition, I have reviewed and discussed with patient certain preventive protocols, quality metrics, and best practice recommendations. A written personalized care plan for preventive services as well as general preventive health recommendations were provided to patient.     Delsa Grana, PA-C  10/24/2019

## 2019-10-27 ENCOUNTER — Telehealth: Payer: Self-pay

## 2019-10-27 ENCOUNTER — Other Ambulatory Visit: Payer: Self-pay

## 2019-10-27 DIAGNOSIS — Z1211 Encounter for screening for malignant neoplasm of colon: Secondary | ICD-10-CM

## 2019-10-27 MED ORDER — NA SULFATE-K SULFATE-MG SULF 17.5-3.13-1.6 GM/177ML PO SOLN: 354.0000 mL | Freq: Once | ORAL | 0 refills | Status: AC

## 2019-10-27 NOTE — Telephone Encounter (Signed)
Gastroenterology Pre-Procedure Review    PATIENT REVIEW QUESTIONS: The patient responded to the following health history questions as indicated:    1. Are you having any GI issues? no 2. Do you have a personal history of Polyps? no 3. Do you have a family history of Colon Cancer or Polyps? no 4. Diabetes Mellitus? no 5. Joint replacements in the past 12 months?no 6. Major health problems in the past 3 months?no 7. Any artificial heart valves, MVP, or defibrillator?no    MEDICATIONS & ALLERGIES:    Patient reports the following regarding taking any anticoagulation/antiplatelet therapy:   Plavix, Coumadin, Eliquis, Xarelto, Lovenox, Pradaxa, Brilinta, or Effient? no Aspirin? Yes Baby Asprin 81mg    Patient confirms/reports the following medications:  Current Outpatient Medications  Medication Sig Dispense Refill  . acetaminophen (TYLENOL) 500 MG tablet Take 1 tablet (500 mg total) by mouth every 6 (six) hours as needed. 30 tablet 0  . ASPIRIN LOW DOSE 81 MG EC tablet TAKE 1 TABLET BY MOUTH ONCE DAILY 30 tablet 11  . Calcium Carbonate-Vitamin D 600-400 MG-UNIT tablet TAKE 1 TABLET BY MOUTH TWICE DAILY 60 tablet 11  . Cranberry-Vitamin C-Vitamin E 4200-20-3 MG-MG-UNIT CAPS Take 1 tablet by mouth daily. 30 capsule 11  . hydrOXYzine (VISTARIL) 25 MG capsule Take 1 capsule (25 mg total) by mouth every 8 (eight) hours as needed for anxiety (anxiety or aggitation, can use before going to dentist). 30 capsule 0  . lisinopril-hydrochlorothiazide (ZESTORETIC) 10-12.5 MG tablet Take 0.5 tablets by mouth daily. 30 tablet 1  . loratadine (CLARITIN) 10 MG tablet Take 1 tablet (10 mg total) by mouth daily as needed for allergies. 90 tablet 3  . Multiple Vitamins-Minerals (MULTIVITAMIN WITH MINERALS) tablet TAKE 1 TABLET BY MOUTH ONCE DAILY 30 tablet 11  . pravastatin (PRAVACHOL) 20 MG tablet Take 1 tablet (20 mg total) by mouth daily. 90 tablet 3  . sulfamethoxazole-trimethoprim (BACTRIM DS) 800-160 MG  tablet Take 1 tablet by mouth 2 (two) times daily. (Patient not taking: Reported on 10/24/2019) 14 tablet 0  . triamcinolone cream (KENALOG) 0.1 % Apply 1 application topically 2 (two) times daily. Behind the right ear as needed 30 g 0  . vitamin B-12 (CYANOCOBALAMIN) 100 MCG tablet TAKE 1 TABLET BY MOUTH ONCE DAILY FOR SUPPLEMENT 30 tablet 11   No current facility-administered medications for this visit.    Patient confirms/reports the following allergies:  No Known Allergies  No orders of the defined types were placed in this encounter.   AUTHORIZATION INFORMATION Primary Insurance: 1D#: Group #:  Secondary Insurance: 1D#: Group #:  SCHEDULE INFORMATION: Date: 11/24/2019 Time: Location:[facility]

## 2019-10-29 NOTE — Telephone Encounter (Signed)
Can you call in a verbal order of the requested over-the-counter supplements for the patient with a year supply, so that the facility can continue to give it to them

## 2019-10-31 NOTE — Telephone Encounter (Signed)
Called in.

## 2019-11-20 ENCOUNTER — Other Ambulatory Visit: Payer: Self-pay

## 2019-11-20 ENCOUNTER — Other Ambulatory Visit
Admission: RE | Admit: 2019-11-20 | Discharge: 2019-11-20 | Disposition: A | Discharge status: Home / Self Care | Payer: Medicare Other | Source: Ambulatory Visit | Attending: Gastroenterology | Admitting: Gastroenterology

## 2019-11-20 DIAGNOSIS — Z20822 Contact with and (suspected) exposure to covid-19: Secondary | ICD-10-CM | POA: Diagnosis not present

## 2019-11-20 DIAGNOSIS — I10 Essential (primary) hypertension: Secondary | ICD-10-CM

## 2019-11-20 DIAGNOSIS — Z01812 Encounter for preprocedural laboratory examination: Secondary | ICD-10-CM | POA: Diagnosis present

## 2019-11-20 LAB — SARS CORONAVIRUS 2 (TAT 6-24 HRS): SARS Coronavirus 2: NEGATIVE

## 2019-11-20 MED ORDER — LISINOPRIL-HYDROCHLOROTHIAZIDE 10-12.5 MG PO TABS: 0.5000 | ORAL_TABLET | Freq: Every day | ORAL | 11 refills | Status: DC

## 2019-11-20 NOTE — Telephone Encounter (Signed)
Hypertension medication request:  Last office visit pertaining to hypertension:2/12  BP Readings from Last 3 Encounters:  10/24/19 116/74  06/17/19 112/76  04/23/19 118/72    Lab Results  Component Value Date   CREATININE 0.90 10/24/2019   BUN 17 10/24/2019   NA 139 10/24/2019   K 4.1 10/24/2019   CL 103 10/24/2019   CO2 28 10/24/2019     No follow-ups on file.

## 2019-11-21 ENCOUNTER — Telehealth: Payer: Self-pay | Admitting: Family Medicine

## 2019-11-21 NOTE — Telephone Encounter (Signed)
They would need consent through GI

## 2019-11-21 NOTE — Telephone Encounter (Signed)
Curlene Dolphin is calling from [facility] is needing consent to take the patient for colonscopy on Monday October 27, 2019. Request today. So can take to the mother to sign it. Cb- [phone]

## 2019-11-24 ENCOUNTER — Other Ambulatory Visit: Payer: Self-pay

## 2019-11-24 ENCOUNTER — Ambulatory Visit: Payer: Medicare Other | Admitting: Certified Registered Nurse Anesthetist

## 2019-11-24 ENCOUNTER — Ambulatory Visit
Admission: RE | Admit: 2019-11-24 | Discharge: 2019-11-24 | Disposition: A | Discharge status: Home / Self Care | Payer: Medicare Other | Attending: Gastroenterology | Admitting: Gastroenterology

## 2019-11-24 ENCOUNTER — Encounter
Admission: RE | Disposition: A | Discharge status: Home / Self Care | Payer: Self-pay | Source: Home / Self Care | Attending: Gastroenterology

## 2019-11-24 ENCOUNTER — Encounter: Payer: Self-pay | Admitting: Gastroenterology

## 2019-11-24 DIAGNOSIS — Z1211 Encounter for screening for malignant neoplasm of colon: Secondary | ICD-10-CM | POA: Diagnosis present

## 2019-11-24 DIAGNOSIS — Z7982 Long term (current) use of aspirin: Secondary | ICD-10-CM | POA: Diagnosis not present

## 2019-11-24 DIAGNOSIS — E7800 Pure hypercholesterolemia, unspecified: Secondary | ICD-10-CM | POA: Diagnosis not present

## 2019-11-24 DIAGNOSIS — M810 Age-related osteoporosis without current pathological fracture: Secondary | ICD-10-CM | POA: Insufficient documentation

## 2019-11-24 DIAGNOSIS — I10 Essential (primary) hypertension: Secondary | ICD-10-CM | POA: Insufficient documentation

## 2019-11-24 DIAGNOSIS — Z66 Do not resuscitate: Secondary | ICD-10-CM | POA: Insufficient documentation

## 2019-11-24 DIAGNOSIS — Z79899 Other long term (current) drug therapy: Secondary | ICD-10-CM | POA: Insufficient documentation

## 2019-11-24 DIAGNOSIS — F79 Unspecified intellectual disabilities: Secondary | ICD-10-CM | POA: Insufficient documentation

## 2019-11-24 HISTORY — PX: COLONOSCOPY WITH PROPOFOL: SHX5780

## 2019-11-24 HISTORY — DX: Pure hypercholesterolemia, unspecified: E78.00

## 2019-11-24 SURGERY — COLONOSCOPY WITH PROPOFOL
Anesthesia: General

## 2019-11-24 MED ORDER — PROPOFOL 500 MG/50ML IV EMUL
INTRAVENOUS | Status: AC
  Filled 2019-11-24: qty 50

## 2019-11-24 MED ORDER — LIDOCAINE HCL (CARDIAC) PF 100 MG/5ML IV SOSY
PREFILLED_SYRINGE | INTRAVENOUS | Status: DC | PRN
  Administered 2019-11-24: 50 mg via INTRAVENOUS

## 2019-11-24 MED ORDER — PROPOFOL 10 MG/ML IV BOLUS
INTRAVENOUS | Status: AC
  Filled 2019-11-24: qty 20

## 2019-11-24 MED ORDER — PROPOFOL 500 MG/50ML IV EMUL
INTRAVENOUS | Status: DC | PRN
  Administered 2019-11-24: 100 ug/kg/min via INTRAVENOUS

## 2019-11-24 MED ORDER — SODIUM CHLORIDE 0.9 % IV SOLN: INTRAVENOUS | Status: DC

## 2019-11-24 MED ORDER — PHENYLEPHRINE HCL (PRESSORS) 10 MG/ML IV SOLN
INTRAVENOUS | Status: DC | PRN
  Administered 2019-11-24: 100 ug via INTRAVENOUS

## 2019-11-24 NOTE — Anesthesia Preprocedure Evaluation (Signed)
Anesthesia Evaluation  Patient identified by MRN, date of birth, ID band Patient awake    Reviewed: Allergy & Precautions, NPO status , Patient's Chart, lab work & pertinent test results  History of Anesthesia Complications Negative for: history of anesthetic complications  Airway Mallampati: II  TM Distance: >3 FB Neck ROM: Full    Dental  (+) Poor Dentition, Missing   Pulmonary neg pulmonary ROS, neg sleep apnea, neg COPD,    breath sounds clear to auscultation- rhonchi (-) wheezing      Cardiovascular hypertension, Pt. on medications (-) CAD, (-) Past MI, (-) Cardiac Stents and (-) CABG  Rhythm:Regular Rate:Normal - Systolic murmurs and - Diastolic murmurs    Neuro/Psych neg Seizures negative neurological ROS  negative psych ROS   GI/Hepatic negative GI ROS, Neg liver ROS,   Endo/Other  negative endocrine ROSneg diabetes  Renal/GU negative Renal ROS     Musculoskeletal negative musculoskeletal ROS (+)   Abdominal   Peds  Hematology negative hematology ROS (+)   Anesthesia Other Findings Past Medical History: 08/29/2017: DNR (do not resuscitate) No date: Hypercholesterolemia No date: Hypertension No date: Kyphosis of thoracic region No date: Mental retardation No date: Osteoporosis   Reproductive/Obstetrics                            Anesthesia Physical Anesthesia Plan  ASA: II  Anesthesia Plan: General   Post-op Pain Management:    Induction: Intravenous  PONV Risk Score and Plan: 2 and Propofol infusion  Airway Management Planned: Natural Airway  Additional Equipment:   Intra-op Plan:   Post-operative Plan:   Informed Consent: I have reviewed the patients History and Physical, chart, labs and discussed the procedure including the risks, benefits and alternatives for the proposed anesthesia with the patient or authorized representative who has indicated his/her  understanding and acceptance.     Dental advisory given  Plan Discussed with: CRNA and Anesthesiologist  Anesthesia Plan Comments:         Anesthesia Quick Evaluation

## 2019-11-24 NOTE — Transfer of Care (Signed)
Immediate Anesthesia Transfer of Care Note  Patient: Tonya Lawson  Procedure(s) Performed: COLONOSCOPY WITH PROPOFOL (N/A )  Patient Location: PACU  Anesthesia Type:General  Level of Consciousness: awake, alert  and oriented  Airway & Oxygen Therapy: Patient Spontanous Breathing and Patient connected to nasal cannula oxygen  Post-op Assessment: Report given to RN and Post -op Vital signs reviewed and stable  Post vital signs: Reviewed and stable  Last Vitals:  Vitals Value Taken Time  BP 77/42 11/24/19 1018  Temp    Pulse    Resp 25 11/24/19 1019  SpO2    Vitals shown include unvalidated device data.  Last Pain: There were no vitals filed for this visit.       Complications: No apparent anesthesia complications

## 2019-11-24 NOTE — Op Note (Signed)
[facility] Patient Name: Tonya Lawson Procedure Date: 11/24/2019 10:02 AM MRN: [id number] Account #: [id number] Date of Birth: 04/16/1961 Admit Type: Outpatient Age: 59 Room: [facility] ENDO ROOM 4 Gender: Female Note Status: Finalized Procedure:             Colonoscopy Indications:           Screening for colorectal malignant neoplasm Providers:             Jonathon Bellows MD, MD Referring MD:          Delsa Grana (Referring MD) Medicines:             Monitored Anesthesia Care Complications:         No immediate complications. Procedure:             Pre-Anesthesia Assessment:                        - ASA Grade Assessment: II - A patient with mild                         systemic disease.                        After obtaining informed consent, the colonoscope was                         passed under direct vision. Throughout the procedure,                         the patient's blood pressure, pulse, and oxygen                         saturations were monitored continuously. The                         Colonoscope was introduced through the anus and                         advanced to the the cecum, identified by the                         appendiceal orifice. The colonoscopy was performed                         with ease. The patient tolerated the procedure well.                         The quality of the bowel preparation was excellent. Findings:      The entire examined colon appeared normal on direct and retroflexion       views. Impression:            - The entire examined colon is normal on direct and                         retroflexion views.                        - No specimens collected. Recommendation:        - Discharge patient to home (with escort).                        -  Resume previous diet.                        - Continue present medications.                        - Repeat colonoscopy in 10 years for screening         purposes. Procedure Code(s):     --- Professional ---                        [id number], Colonoscopy, flexible; diagnostic, including                         collection of specimen(s) by brushing or washing, when                         performed (separate procedure) Diagnosis Code(s):     --- Professional ---                        Z12.11, Encounter for screening for malignant neoplasm                         of colon CPT copyright 2019 American Medical Association. All rights reserved. The codes documented in this report are preliminary and upon coder review may  be revised to meet current compliance requirements. Jonathon Bellows, MD Jonathon Bellows MD, MD 11/24/2019 10:15:15 AM This report has been signed electronically. Number of Addenda: 0 Note Initiated On: 11/24/2019 10:02 AM Scope Withdrawal Time: 0 hours 5 minutes 11 seconds  Total Procedure Duration: 0 hours 7 minutes 12 seconds  Estimated Blood Loss:  Estimated blood loss: none.      [facility]

## 2019-11-24 NOTE — H&P (Signed)
Tonya Bellows, MD [address] [place], [place], [place], [address] [address], [place], [place], [place], [address] Phone: [phone]  Fax: [phone]  Primary Care Physician:  Tonya Grana, PA-C   Pre-Procedure History & Physical: HPI:  Tonya Lawson is a 59 y.o. female is here for an colonoscopy.   Past Medical History:  Diagnosis Date  . DNR (do not resuscitate) 08/29/2017  . Hypercholesterolemia   . Hypertension   . Kyphosis of thoracic region   . Mental retardation   . Osteoporosis     Past Surgical History:  Procedure Laterality Date  . ABDOMINAL HYSTERECTOMY     completed    Prior to Admission medications   Medication Sig Start Date End Date Taking? Authorizing Provider  ASPIRIN LOW DOSE 81 MG EC tablet TAKE 1 TABLET BY MOUTH ONCE DAILY 06/24/19  Yes Tonya Grana, PA-C  Calcium Carbonate-Vitamin D 600-400 MG-UNIT tablet TAKE 1 TABLET BY MOUTH TWICE DAILY 12/27/18  Yes Tonya, Satira Anis, MD  Cranberry-Vitamin C-Vitamin E 4200-20-3 MG-MG-UNIT CAPS Take 1 tablet by mouth daily. 12/31/18  Yes Tonya, Satira Anis, MD  hydrOXYzine (VISTARIL) 25 MG capsule Take 1 capsule (25 mg total) by mouth every 8 (eight) hours as needed for anxiety (anxiety or aggitation, can use before going to dentist). 06/17/19  Yes Tonya Grana, PA-C  lisinopril-hydrochlorothiazide (ZESTORETIC) 10-12.5 MG tablet Take 0.5 tablets by mouth daily. 11/20/19  Yes Tonya Grana, PA-C  loratadine (CLARITIN) 10 MG tablet Take 1 tablet (10 mg total) by mouth daily as needed for allergies. 04/23/19  Yes Tonya, Bethel Born, NP  pravastatin (PRAVACHOL) 20 MG tablet Take 1 tablet (20 mg total) by mouth daily. 04/23/19  Yes Tonya, Bethel Born, NP  acetaminophen (TYLENOL) 500 MG tablet Take 1 tablet (500 mg total) by mouth every 6 (six) hours as needed. 07/17/18   Tonya, Bethel Born, NP  Multiple Vitamins-Minerals (MULTIVITAMIN WITH MINERALS) tablet TAKE 1 TABLET BY MOUTH ONCE DAILY 06/03/18   Tonya, Satira Anis, MD  sulfamethoxazole-trimethoprim (BACTRIM DS) 800-160 MG tablet Take 1 tablet by mouth 2 (two) times daily. Patient not taking: Reported on 10/24/2019 09/30/19   Steele Sizer, MD  triamcinolone cream (KENALOG) 0.1 % Apply 1 application topically 2 (two) times daily. Behind the right ear as needed 04/23/19   Tonya Severance, NP  vitamin B-12 (CYANOCOBALAMIN) 100 MCG tablet TAKE 1 TABLET BY MOUTH ONCE DAILY FOR SUPPLEMENT 12/27/18   Tonya Courser, MD    Allergies as of 10/28/2019  . (No Known Allergies)    Family History  Problem Relation Age of Onset  . Diabetes Mother   . Stroke Mother   . Cancer Father        brain  . Breast cancer Maternal Aunt 70  . Cancer Brother     Social History   Socioeconomic History  . Marital status: Unknown    Spouse name: Not on file  . Number of children: Not on file  . Years of education: Not on file  . Highest education level: Not on file  Occupational History  . Not on file  Tobacco Use  . Smoking status: Never Smoker  . Smokeless tobacco: Never Used  Substance and Sexual Activity  . Alcohol use: No  . Drug use: No  . Sexual activity: Never  Other Topics Concern  . Not on file  Social History Narrative  . Not on file   Social Determinants of Health   Financial Resource Strain:   .  Difficulty of Paying Living Expenses:   Food Insecurity:   . Worried About Charity fundraiser in the Last Year:   . Arboriculturist in the Last Year:   Transportation Needs:   . Film/video editor (Medical):   Tonya Lawson Lack of Transportation (Non-Medical):   Physical Activity:   . Days of Exercise per Week:   . Minutes of Exercise per Session:   Stress:   . Feeling of Stress :   Social Connections:   . Frequency of Communication with Friends and Family:   . Frequency of Social Gatherings with Friends and Family:   . Attends Religious Services:   . Active Member of Clubs or Organizations:   . Attends Archivist Meetings:   Tonya Lawson  Marital Status:   Intimate Partner Violence:   . Fear of Current or Ex-Partner:   . Emotionally Abused:   Tonya Lawson Physically Abused:   . Sexually Abused:     Review of Systems: See HPI, otherwise negative ROS  Physical Exam: There were no vitals taken for this visit. General:   Alert,  pleasant and cooperative in NAD Head:  Normocephalic and atraumatic. Neck:  Supple; no masses or thyromegaly. Lungs:  Clear throughout to auscultation, normal respiratory effort.    Heart:  +S1, +S2, Regular rate and rhythm, No edema. Abdomen:  Soft, nontender and nondistended. Normal bowel sounds, without guarding, and without rebound.   Neurologic:  Alert and  oriented x4;  grossly normal neurologically.  Impression/Plan: [name] is here for an colonoscopy to be performed for Screening colonoscopy average risk   Risks, benefits, limitations, and alternatives regarding  colonoscopy have been reviewed with the patient.  Questions have been answered.  All parties agreeable.   Tonya Bellows, MD  11/24/2019, 9:47 AM

## 2019-11-24 NOTE — Anesthesia Postprocedure Evaluation (Signed)
Anesthesia Post Note  Patient: Tonya Lawson  Procedure(s) Performed: COLONOSCOPY WITH PROPOFOL (N/A )  Patient location during evaluation: PACU Anesthesia Type: General Level of consciousness: awake and alert and oriented Pain management: pain level controlled Vital Signs Assessment: post-procedure vital signs reviewed and stable Respiratory status: spontaneous breathing, nonlabored ventilation and respiratory function stable Cardiovascular status: blood pressure returned to baseline and stable Postop Assessment: no signs of nausea or vomiting Anesthetic complications: no     Last Vitals:  Vitals:   11/24/19 1038 11/24/19 1048  BP:    Pulse: 76 71  Temp:    SpO2: 100% 100%    Last Pain:  Vitals:   11/24/19 1048  TempSrc:   PainSc: 0-No pain                 Karren Newland

## 2019-11-24 NOTE — Progress Notes (Signed)
Confirmed consent with mother Tawanna Sat [phone]) via telephone.

## 2019-11-25 ENCOUNTER — Other Ambulatory Visit: Payer: Self-pay

## 2019-11-25 ENCOUNTER — Telehealth: Payer: Self-pay | Admitting: Family Medicine

## 2019-11-25 ENCOUNTER — Encounter: Payer: Self-pay | Admitting: *Deleted

## 2019-11-25 MED ORDER — MULTI-VITAMIN/MINERALS PO TABS: 1.0000 | ORAL_TABLET | Freq: Every day | ORAL | 11 refills | Status: DC

## 2019-11-25 NOTE — Telephone Encounter (Signed)
Will fax results to [facility] @ [phone]

## 2019-11-25 NOTE — Telephone Encounter (Signed)
In group home

## 2019-11-25 NOTE — Telephone Encounter (Signed)
Copied from [place] [phone]. 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  Please call to discuss at [phone]

## 2019-12-05 ENCOUNTER — Ambulatory Visit
Admission: RE | Admit: 2019-12-05 | Discharge: 2019-12-05 | Disposition: A | Discharge status: Home / Self Care | Payer: Medicare Other | Source: Ambulatory Visit | Attending: Family Medicine | Admitting: Family Medicine

## 2019-12-05 DIAGNOSIS — Z1231 Encounter for screening mammogram for malignant neoplasm of breast: Secondary | ICD-10-CM | POA: Diagnosis not present

## 2019-12-18 ENCOUNTER — Other Ambulatory Visit: Payer: Self-pay

## 2019-12-18 DIAGNOSIS — E785 Hyperlipidemia, unspecified: Secondary | ICD-10-CM

## 2019-12-18 MED ORDER — VITAMIN B-12 100 MCG PO TABS: ORAL_TABLET | ORAL | 11 refills | Status: DC

## 2019-12-18 MED ORDER — ASPIRIN 81 MG PO TBEC: 81.0000 mg | DELAYED_RELEASE_TABLET | Freq: Every day | ORAL | 11 refills | Status: DC

## 2019-12-18 MED ORDER — PRAVASTATIN SODIUM 20 MG PO TABS: 20.0000 mg | ORAL_TABLET | Freq: Every day | ORAL | 1 refills | Status: DC

## 2019-12-18 MED ORDER — CALCIUM CARBONATE-VITAMIN D 600-400 MG-UNIT PO TABS: 1.0000 | ORAL_TABLET | Freq: Two times a day (BID) | ORAL | 11 refills | Status: DC

## 2020-01-21 ENCOUNTER — Other Ambulatory Visit: Payer: Self-pay

## 2020-01-21 ENCOUNTER — Encounter: Payer: Self-pay | Admitting: Family Medicine

## 2020-01-21 ENCOUNTER — Ambulatory Visit (INDEPENDENT_AMBULATORY_CARE_PROVIDER_SITE_OTHER): Payer: Medicare Other | Admitting: Family Medicine

## 2020-01-21 VITALS — BP 120/72 | HR 98 | Temp 97.9°F | Resp 16 | Ht 62.0 in | Wt 148.3 lb

## 2020-01-21 DIAGNOSIS — Z0110 Encounter for examination of ears and hearing without abnormal findings: Secondary | ICD-10-CM

## 2020-01-21 DIAGNOSIS — R609 Edema, unspecified: Secondary | ICD-10-CM | POA: Diagnosis not present

## 2020-01-21 DIAGNOSIS — E785 Hyperlipidemia, unspecified: Secondary | ICD-10-CM

## 2020-01-21 DIAGNOSIS — N1831 Chronic kidney disease, stage 3a: Secondary | ICD-10-CM | POA: Diagnosis not present

## 2020-01-21 DIAGNOSIS — I10 Essential (primary) hypertension: Secondary | ICD-10-CM | POA: Diagnosis not present

## 2020-01-21 MED ORDER — PRAVASTATIN SODIUM 20 MG PO TABS: 20.0000 mg | ORAL_TABLET | Freq: Every day | ORAL | 11 refills | Status: DC

## 2020-01-21 NOTE — Patient Instructions (Addendum)
Hearing test normal today - whisper test/ear exam with todays visit  Please bring in a copy of COVID vaccine records  Pt has hysterectomy and there is no indication for cervical cancer screening because she had her cervix SURGICALLY REMOVED.  No indication or need for PAP or GYN referral.   Health Maintenance  Topic Date Due  . COVID-19 Vaccine (1) Never done  . INFLUENZA VACCINE  04/11/2020  . MAMMOGRAM  12/04/2020  . TETANUS/TDAP  12/14/2027  . COLONOSCOPY  11/23/2029  . Hepatitis C Screening  Completed  . HIV Screening  Completed

## 2020-01-21 NOTE — Progress Notes (Signed)
Name: Tonya Lawson   MRN: [id number]    DOB: June 18, 1961   Date:01/21/2020       Progress Note  Chief Complaint  Patient presents with  . Follow-up  . dyslipidemia  . Thrombocytosis  . Hypertension     Subjective:   Tonya Lawson is a 59 y.o. female, presents to clinic for routine follow up on the conditions listed above.  Hypertension:  Currently managed lisinopril-HCTZ 10-12.5, changed from norvasc to see if LE edema would improve, today leg swelling is better than her past baseline.   Pt reports good med compliance and denies any SE.  No lightheadedness, hypotension, syncope. Blood pressure today is well controlled. BP Readings from Last 3 Encounters:  01/21/20 120/72  11/24/19 93/62  10/24/19 116/74   Pt denies CP, SOB, exertional sx, palpitation, Ha's, visual disturbances Dietary efforts for BP?  Healthy, low salt  LE edema- mild improvement, recent edema with dermatitis which lead to cellulitis.  Since tx of cellulitis 2-3 months ago and changing blood pressure pills no recurrent severe edema, skin changes, redness, rash or sores.  Still pitting edema.  Pt has not seen vascular specialist before, has not had compression sock tried OTC or had Rx for them before.    No orthopnea, PND, weight gain.  HLD compliant with statin, no reported SE or concerns      Patient Active Problem List   Diagnosis Date Noted  . Dyslipidemia 10/10/2018  . Dental caries 10/10/2018  . Overweight (BMI 25.0-29.9) 02/01/2018  . Colon cancer screening 08/29/2017  . DNR (do not resuscitate) 08/29/2017  . Hypertension   . Osteoporosis   . Intellectual disability   . Kyphosis of thoracic region     Past Surgical History:  Procedure Laterality Date  . ABDOMINAL HYSTERECTOMY     completed  . COLONOSCOPY WITH PROPOFOL N/A 11/24/2019   Procedure: COLONOSCOPY WITH PROPOFOL;  Surgeon: Jonathon Bellows, MD;  Location: [facility] ENDOSCOPY;  Service: Gastroenterology;  Laterality: N/A;     Family History  Problem Relation Age of Onset  . Diabetes Mother   . Stroke Mother   . Cancer Father        brain  . Breast cancer Maternal Aunt 70  . Cancer Brother     Social History   Tobacco Use  . Smoking status: Never Smoker  . Smokeless tobacco: Never Used  Substance Use Topics  . Alcohol use: No  . Drug use: No      Current Outpatient Medications:  .  aspirin (ASPIRIN LOW DOSE) 81 MG EC tablet, Take 1 tablet (81 mg total) by mouth daily. Swallow whole., Disp: 30 tablet, Rfl: 11 .  Calcium Carbonate-Vitamin D 600-400 MG-UNIT tablet, Take 1 tablet by mouth 2 (two) times daily., Disp: 60 tablet, Rfl: 11 .  Cranberry-Vitamin C-Vitamin E 4200-20-3 MG-MG-UNIT CAPS, Take 1 tablet by mouth daily., Disp: 30 capsule, Rfl: 11 .  hydrOXYzine (VISTARIL) 25 MG capsule, Take 1 capsule (25 mg total) by mouth every 8 (eight) hours as needed for anxiety (anxiety or aggitation, can use before going to dentist)., Disp: 30 capsule, Rfl: 0 .  lisinopril-hydrochlorothiazide (ZESTORETIC) 10-12.5 MG tablet, Take 0.5 tablets by mouth daily., Disp: 30 tablet, Rfl: 11 .  loratadine (CLARITIN) 10 MG tablet, Take 1 tablet (10 mg total) by mouth daily as needed for allergies., Disp: 90 tablet, Rfl: 3 .  Multiple Vitamins-Minerals (MULTIVITAMIN WITH MINERALS) tablet, Take 1 tablet by mouth daily., Disp: 30 tablet, Rfl: 11 .  pravastatin (PRAVACHOL) 20 MG tablet, Take 1 tablet (20 mg total) by mouth daily., Disp: 90 tablet, Rfl: 1 .  triamcinolone cream (KENALOG) 0.1 %, Apply 1 application topically 2 (two) times daily. Behind the right ear as needed, Disp: 30 g, Rfl: 0 .  vitamin B-12 (CYANOCOBALAMIN) 100 MCG tablet, TAKE 1 TABLET BY MOUTH ONCE DAILY FOR SUPPLEMENT, Disp: 30 tablet, Rfl: 11 .  acetaminophen (TYLENOL) 500 MG tablet, Take 1 tablet (500 mg total) by mouth every 6 (six) hours as needed., Disp: 30 tablet, Rfl: 0  No Known Allergies  Chart Review Today: I personally reviewed active  problem list, medication list, allergies, family history, social history, health maintenance, notes from last encounter, lab results, imaging with the patient/caregiver today.   Review of Systems  10 Systems reviewed and are negative for acute change except as noted in the HPI.   Objective:    Vitals:   01/21/20 1000  BP: 120/72  Pulse: 98  Resp: 16  Temp: 97.9 F (36.6 C)  TempSrc: Temporal  SpO2: 97%  Weight: 148 lb 4.8 oz (67.3 kg)  Height: 5\' 2"  (1.575 m)    Body mass index is 27.12 kg/m.  Physical Exam Vitals and nursing note reviewed.  Constitutional:      Appearance: She is well-developed.  HENT:     Head: Normocephalic and atraumatic.     Right Ear: Hearing, tympanic membrane, ear canal and external ear normal. No decreased hearing noted.     Left Ear: Hearing, tympanic membrane, ear canal and external ear normal. No decreased hearing noted.     Ears:     Comments: Grossly normal hearing    Nose: Nose normal.  Eyes:     General:        Right eye: No discharge.        Left eye: No discharge.     Conjunctiva/sclera: Conjunctivae normal.  Neck:     Trachea: No tracheal deviation.  Cardiovascular:     Rate and Rhythm: Normal rate and regular rhythm.  Pulmonary:     Effort: Pulmonary effort is normal. No respiratory distress.     Breath sounds: No stridor.  Musculoskeletal:        General: Normal range of motion.     Right lower leg: Edema present.     Left lower leg: Edema present.  Skin:    General: Skin is warm and dry.     Findings: No rash.  Neurological:     Mental Status: She is alert.     Motor: No abnormal muscle tone.     Coordination: Coordination normal.  Psychiatric:        Behavior: Behavior normal.     PHQ2/9: Depression screen [facility] 2/9 06/17/2019 04/23/2019 01/08/2019 10/10/2018 10/10/2018  Decreased Interest 0 0 0 0 0  Down, Depressed, Hopeless 0 0 0 0 0  PHQ - 2 Score 0 0 0 0 0  Altered sleeping 0 0 0 0 0  Tired, decreased energy 0  0 0 0 0  Change in appetite 0 0 0 0 0  Feeling bad or failure about yourself  0 0 0 0 0  Trouble concentrating 0 0 0 0 0  Moving slowly or fidgety/restless 0 0 0 0 0  Suicidal thoughts 0 0 0 0 0  PHQ-9 Score 0 0 0 0 0  Difficult doing work/chores - Not difficult at all Not difficult at all Not difficult at all Not difficult at all  Some recent  data might be hidden    phq 9 is neg  Fall Risk: Fall Risk  01/21/2020 10/24/2019 09/30/2019 06/17/2019 04/23/2019  Falls in the past year? 0 0 0 0 0  Number falls in past yr: 0 0 0 0 0  Injury with Fall? 0 0 0 0 0  Risk for fall due to : - - - - -  Follow up - Education provided;Falls evaluation completed - - -    Functional Status Survey: Is the patient deaf or have difficulty hearing?: No Does the patient have difficulty seeing, even when wearing glasses/contacts?: No Does the patient have difficulty concentrating, remembering, or making decisions?: Yes Does the patient have difficulty walking or climbing stairs?: No Does the patient have difficulty dressing or bathing?: Yes Does the patient have difficulty doing errands alone such as visiting a doctor's office or shopping?: Yes   Assessment & Plan:     ICD-10-CM   1. Dependent edema  R60.9 Ambulatory referral to Vascular Surgery   no improvement with changing HTN meds to HCTZ, has hx of wounds, persistent LE edema, refer to vascular   2. Essential hypertension  99991111 COMPLETE METABOLIC PANEL WITH GFR   well controlled, stable, no SE from meds, due for labs  3. Dyslipidemia  E78.5 pravastatin (PRAVACHOL) 20 MG tablet    COMPLETE METABOLIC PANEL WITH GFR   compliant with statin, no myalgias  4. Stage 3a chronic kidney disease  [id number] COMPLETE METABOLIC PANEL WITH GFR   monitoring labs, avoid NSAIDs, encouraged to continue to push hydration  5. Passed hearing screening  Z01.10    requested hearing be assessed for the state and group home facility     Return in about 6 months (around  07/23/2020) for routine f/up.   Delsa Grana, PA-C 01/21/20 10:23 AM

## 2020-01-22 LAB — COMPLETE METABOLIC PANEL WITH GFR
AG Ratio: 1.8 (calc) (ref 1.0–2.5)
ALT: 11 U/L (ref 6–29)
AST: 15 U/L (ref 10–35)
Albumin: 4.1 g/dL (ref 3.6–5.1)
Alkaline phosphatase (APISO): 66 U/L (ref 37–153)
BUN: 12 mg/dL (ref 7–25)
CO2: 29 mmol/L (ref 20–32)
Calcium: 9.8 mg/dL (ref 8.6–10.4)
Chloride: 105 mmol/L (ref 98–110)
Creat: 0.9 mg/dL (ref 0.50–1.05)
GFR, Est African American: 82 mL/min/{1.73_m2} (ref >=60)
GFR, Est Non African American: 70 mL/min/{1.73_m2} (ref >=60)
Globulin: 2.3 g/dL (calc) (ref 1.9–3.7)
Glucose, Bld: 75 mg/dL (ref 65–99)
Potassium: 4.6 mmol/L (ref 3.5–5.3)
Sodium: 141 mmol/L (ref 135–146)
Total Bilirubin: 0.3 mg/dL (ref 0.2–1.2)
Total Protein: 6.4 g/dL (ref 6.1–8.1)

## 2020-02-17 ENCOUNTER — Ambulatory Visit (INDEPENDENT_AMBULATORY_CARE_PROVIDER_SITE_OTHER): Payer: Medicare Other | Admitting: Vascular Surgery

## 2020-02-17 ENCOUNTER — Other Ambulatory Visit: Payer: Self-pay

## 2020-02-17 ENCOUNTER — Encounter (INDEPENDENT_AMBULATORY_CARE_PROVIDER_SITE_OTHER): Payer: Self-pay | Admitting: Vascular Surgery

## 2020-02-17 VITALS — BP 124/85 | HR 91 | Resp 16 | Wt 148.4 lb

## 2020-02-17 DIAGNOSIS — M7989 Other specified soft tissue disorders: Secondary | ICD-10-CM

## 2020-02-17 DIAGNOSIS — E785 Hyperlipidemia, unspecified: Secondary | ICD-10-CM | POA: Diagnosis not present

## 2020-02-17 DIAGNOSIS — I10 Essential (primary) hypertension: Secondary | ICD-10-CM

## 2020-02-17 NOTE — Progress Notes (Signed)
Patient ID: Tonya Lawson, female   DOB: [birth year], 59 y.o.   MRN: [id number]  Chief Complaint  Patient presents with  . New Patient (Initial Visit)    ref Lucio Edward dependent edema    HPI Tonya Lawson is a 59 y.o. female.  I am asked to see the patient by L. Tapia for evaluation of leg swelling. She can not provide any history and her caregiver is providing that today.  Her caregiver states that she has had swelling in both her legs for as long as she has limited her group home.  That has been over 2 years.  It is unclear how many years before that she had swelling.  Both legs swell but the left is the worst.  This progresses throughout the day.  They really cannot get her to elevate her legs or wear stockings thus far although true compression stockings have not yet been prescribed.  She does not walk a whole lot.  She does not have any ulceration or infection.  When asked if it hurts she shakes her head no.  No fever or chills.  No chest pain or shortness of breath   Past Medical History:  Diagnosis Date  . DNR (do not resuscitate) 08/29/2017  . Hypercholesterolemia   . Hypertension   . Kyphosis of thoracic region   . Mental retardation   . Osteoporosis     Past Surgical History:  Procedure Laterality Date  . ABDOMINAL HYSTERECTOMY     completed  . COLONOSCOPY WITH PROPOFOL N/A 11/24/2019   Procedure: COLONOSCOPY WITH PROPOFOL;  Surgeon: Jonathon Bellows, MD;  Location: [facility] ENDOSCOPY;  Service: Gastroenterology;  Laterality: N/A;     Family History  Problem Relation Age of Onset  . Diabetes Mother   . Stroke Mother   . Cancer Father        brain  . Breast cancer Maternal Aunt 70  . Cancer Brother      Social History   Tobacco Use  . Smoking status: Never Smoker  . Smokeless tobacco: Never Used  Substance Use Topics  . Alcohol use: No  . Drug use: No     No Known Allergies  Current Outpatient Medications  Medication Sig Dispense Refill  . acetaminophen  (TYLENOL) 500 MG tablet Take 1 tablet (500 mg total) by mouth every 6 (six) hours as needed. 30 tablet 0  . aspirin (ASPIRIN LOW DOSE) 81 MG EC tablet Take 1 tablet (81 mg total) by mouth daily. Swallow whole. 30 tablet 11  . Calcium Carbonate-Vitamin D 600-400 MG-UNIT tablet Take 1 tablet by mouth 2 (two) times daily. 60 tablet 11  . Cranberry-Vitamin C-Vitamin E 4200-20-3 MG-MG-UNIT CAPS Take 1 tablet by mouth daily. 30 capsule 11  . hydrOXYzine (VISTARIL) 25 MG capsule Take 1 capsule (25 mg total) by mouth every 8 (eight) hours as needed for anxiety (anxiety or aggitation, can use before going to dentist). 30 capsule 0  . lisinopril-hydrochlorothiazide (ZESTORETIC) 10-12.5 MG tablet Take 0.5 tablets by mouth daily. 30 tablet 11  . loratadine (CLARITIN) 10 MG tablet Take 1 tablet (10 mg total) by mouth daily as needed for allergies. 90 tablet 3  . Multiple Vitamins-Minerals (MULTIVITAMIN WITH MINERALS) tablet Take 1 tablet by mouth daily. 30 tablet 11  . pravastatin (PRAVACHOL) 20 MG tablet Take 1 tablet (20 mg total) by mouth daily. 31 tablet 11  . triamcinolone cream (KENALOG) 0.1 % Apply 1 application topically 2 (two) times daily. Behind the right  ear as needed 30 g 0  . vitamin B-12 (CYANOCOBALAMIN) 100 MCG tablet TAKE 1 TABLET BY MOUTH ONCE DAILY FOR SUPPLEMENT 30 tablet 11   No current facility-administered medications for this visit.      REVIEW OF SYSTEMS (Negative unless checked)  Constitutional: [] Weight loss  [] Fever  [] Chills Cardiac: [] Chest pain   [] Chest pressure   [] Palpitations   [] Shortness of breath when laying flat   [] Shortness of breath at rest   [] Shortness of breath with exertion. Vascular:  [] Pain in legs with walking   [] Pain in legs at rest   [] Pain in legs when laying flat   [] Claudication   [] Pain in feet when walking  [] Pain in feet at rest  [] Pain in feet when laying flat   [] History of DVT   [] Phlebitis   [x] Swelling in legs   [] Varicose veins   [] Non-healing  ulcers Pulmonary:   [] Uses home oxygen   [] Productive cough   [] Hemoptysis   [] Wheeze  [] COPD   [] Asthma Neurologic:  [] Dizziness  [] Blackouts   [] Seizures   [] History of stroke   [] History of TIA  [] Aphasia   [] Temporary blindness   [] Dysphagia   [] Weakness or numbness in arms   [] Weakness or numbness in legs  X cognitive issues Musculoskeletal:  [] Arthritis   [] Joint swelling   [] Joint pain   [] Low back pain Hematologic:  [] Easy bruising  [] Easy bleeding   [] Hypercoagulable state   [] Anemic  [] Hepatitis Gastrointestinal:  [] Blood in stool   [] Vomiting blood  [] Gastroesophageal reflux/heartburn   [] Abdominal pain Genitourinary:  [] Chronic kidney disease   [] Difficult urination  [] Frequent urination  [] Burning with urination   [] Hematuria Skin:  [] Rashes   [] Ulcers   [] Wounds Psychological:  [] History of anxiety   []  History of major depression.    Physical Exam BP 124/85 (BP Location: Right Arm)   Pulse 91   Resp 16   Wt 148 lb 6.4 oz (67.3 kg)   BMI 27.14 kg/m  Gen:  WD/WN, NAD. Appears older than stated age. Head: [place]/AT, No temporalis wasting. Ear/Nose/Throat: Hearing grossly intact, nares w/o erythema or drainage, oropharynx w/o Erythema/Exudate Eyes: Conjunctiva clear, sclera non-icteric  Neck: trachea midline.  No JVD.  Pulmonary:  Good air movement, respirations not labored, no use of accessory muscles  Cardiac: RRR, no JVD Vascular:  Vessel Right Left  Radial Palpable Palpable                          DP 1+ 1+  PT 1+ trace   Gastrointestinal:. No masses, surgical incisions, or scars. Musculoskeletal: M/S 5/5 throughout.  Extremities without ischemic changes.  No deformity or atrophy. 1-2+ RLE edema, 2-3+ LLE edema. Neurologic: Sensation grossly intact in extremities.  Symmetrical.  Speech is fluent. Motor exam as listed above. Psychiatric: Judgment intact, Mood & affect appropriate for pt's clinical situation. Dermatologic: No rashes or ulcers noted.  No cellulitis  or open wounds.    Radiology No results found.  Labs Recent Results (from the past 2160 hour(s))  SARS CORONAVIRUS 2 (TAT 6-24 HRS) Nasopharyngeal Nasopharyngeal Swab     Status: None   Collection Time: 11/20/19 10:39 AM   Specimen: Nasopharyngeal Swab  Result Value Ref Range   SARS Coronavirus 2 NEGATIVE NEGATIVE    Comment: (NOTE) SARS-CoV-2 target nucleic acids are NOT DETECTED. The SARS-CoV-2 RNA is generally detectable in upper and lower respiratory specimens during the acute phase of infection. Negative results do not preclude  SARS-CoV-2 infection, do not rule out co-infections with other pathogens, and should not be used as the sole basis for treatment or other patient management decisions. Negative results must be combined with clinical observations, patient history, and epidemiological information. The expected result is Negative. Fact Sheet for Patients: SugarRoll.be Fact Sheet for Healthcare Providers: https://www.woods-mathews.com/ This test is not yet approved or cleared by the [place] FDA and  has been authorized for detection and/or diagnosis of SARS-CoV-2 by FDA under an Emergency Use Authorization (EUA). This EUA will remain  in effect (meaning this test can be used) for the duration of the COVID-19 declaration under Section 56 4(b)(1) of the Act, 21 U.S.C. section 360bbb-3(b)(1), unless the authorization is terminated or revoked sooner. Performed at [facility], [place] [address], [place], [place] [address]   COMPLETE METABOLIC PANEL WITH GFR     Status: None   Collection Time: 01/21/20 11:06 AM  Result Value Ref Range   Glucose, Bld 75 65 - 99 mg/dL    Comment: .            Fasting reference interval .    BUN 12 7 - 25 mg/dL   Creat 0.90 0.50 - 1.05 mg/dL    Comment: For patients >87 years of age, the reference limit for Creatinine is approximately 13% higher for people identified as  African-American. .    GFR, Est Non African American 70 > OR = 60 mL/min/1.40m2   GFR, Est African American 82 > OR = 60 mL/min/1.71m2   BUN/Creatinine Ratio NOT APPLICABLE 6 - 22 (calc)   Sodium 141 135 - 146 mmol/L   Potassium 4.6 3.5 - 5.3 mmol/L   Chloride 105 98 - 110 mmol/L   CO2 29 20 - 32 mmol/L   Calcium 9.8 8.6 - 10.4 mg/dL   Total Protein 6.4 6.1 - 8.1 g/dL   Albumin 4.1 3.6 - 5.1 g/dL   Globulin 2.3 1.9 - 3.7 g/dL (calc)   AG Ratio 1.8 1.0 - 2.5 (calc)   Total Bilirubin 0.3 0.2 - 1.2 mg/dL   Alkaline phosphatase (APISO) 66 37 - 153 U/L   AST 15 10 - 35 U/L   ALT 11 6 - 29 U/L    Assessment/Plan:  Hypertension blood pressure control important in reducing the progression of atherosclerotic disease. On appropriate oral medications.   Dyslipidemia lipid control important in reducing the progression of atherosclerotic disease. Continue statin therapy   Swelling of limb I have had a long discussion with the patient regarding swelling and why it  causes symptoms.  Patient will begin wearing graduated compression stockings class 1 (20-30 mmHg) on a daily basis a prescription was given. The patient will  beginning wearing the stockings first thing in the morning and removing them in the evening. The patient is instructed specifically not to sleep in the stockings.   In addition, behavioral modification will be initiated.  This will include frequent elevation, use of over the counter pain medications and exercise such as walking.  I have reviewed systemic causes for chronic edema such as liver, kidney and cardiac etiologies.  The patient denies problems with these organ systems.    Consideration for a lymph pump will also be made based upon the effectiveness of conservative therapy.  This would help to improve the edema control and prevent sequela such as ulcers and infections   Patient should undergo duplex ultrasound of the venous system to ensure that DVT or reflux is not  present.  The patient will follow-up with me after the ultrasound.        Leotis Pain 02/17/2020, 1:57 PM   This note was created with Dragon medical transcription system.  Any errors from dictation are unintentional.

## 2020-02-17 NOTE — Patient Instructions (Signed)
Edema  Edema is when you have too much fluid in your body or under your skin. Edema may make your legs, feet, and ankles swell up. Swelling is also common in looser tissues, like around your eyes. This is a common condition. It gets more common as you get older. There are many possible causes of edema. Eating too much salt (sodium) and being on your feet or sitting for a long time can cause edema in your legs, feet, and ankles. Hot weather may make edema worse. Edema is usually painless. Your skin may look swollen or shiny. Follow these instructions at home:  Keep the swollen body part raised (elevated) above the level of your heart when you are sitting or lying down.  Do not sit still or stand for a long time.  Do not wear tight clothes. Do not wear garters on your upper legs.  Exercise your legs. This can help the swelling go down.  Wear elastic bandages or support stockings as told by your doctor.  Eat a low-salt (low-sodium) diet to reduce fluid as told by your doctor.  Depending on the cause of your swelling, you may need to limit how much fluid you drink (fluid restriction).  Take over-the-counter and prescription medicines only as told by your doctor. Contact a doctor if:  Treatment is not working.  You have heart, liver, or kidney disease and have symptoms of edema.  You have sudden and unexplained weight gain. Get help right away if:  You have shortness of breath or chest pain.  You cannot breathe when you lie down.  You have pain, redness, or warmth in the swollen areas.  You have heart, liver, or kidney disease and get edema all of a sudden.  You have a fever and your symptoms get worse all of a sudden. Summary  Edema is when you have too much fluid in your body or under your skin.  Edema may make your legs, feet, and ankles swell up. Swelling is also common in looser tissues, like around your eyes.  Raise (elevate) the swollen body part above the level of your  heart when you are sitting or lying down.  Follow your doctor's instructions about diet and how much fluid you can drink (fluid restriction). This information is not intended to replace advice given to you by your health care provider. Make sure you discuss any questions you have with your health care provider. Document Revised: 08/31/2017 Document Reviewed: 09/15/2016 Elsevier Patient Education  2020 Elsevier Inc.  

## 2020-02-17 NOTE — Assessment & Plan Note (Signed)

## 2020-02-17 NOTE — Assessment & Plan Note (Signed)
lipid control important in reducing the progression of atherosclerotic disease. Continue statin therapy  

## 2020-02-17 NOTE — Assessment & Plan Note (Signed)
blood pressure control important in reducing the progression of atherosclerotic disease. On appropriate oral medications.  

## 2020-02-24 ENCOUNTER — Ambulatory Visit (INDEPENDENT_AMBULATORY_CARE_PROVIDER_SITE_OTHER): Payer: Medicare Other | Admitting: Nurse Practitioner

## 2020-02-24 ENCOUNTER — Encounter (INDEPENDENT_AMBULATORY_CARE_PROVIDER_SITE_OTHER): Payer: Self-pay | Admitting: Nurse Practitioner

## 2020-02-24 ENCOUNTER — Ambulatory Visit (INDEPENDENT_AMBULATORY_CARE_PROVIDER_SITE_OTHER): Payer: Medicare Other

## 2020-02-24 ENCOUNTER — Other Ambulatory Visit: Payer: Self-pay

## 2020-02-24 VITALS — BP 123/89 | HR 93 | Ht <= 58 in | Wt 149.0 lb

## 2020-02-24 DIAGNOSIS — E785 Hyperlipidemia, unspecified: Secondary | ICD-10-CM | POA: Diagnosis not present

## 2020-02-24 DIAGNOSIS — I10 Essential (primary) hypertension: Secondary | ICD-10-CM | POA: Diagnosis not present

## 2020-02-24 DIAGNOSIS — M7989 Other specified soft tissue disorders: Secondary | ICD-10-CM | POA: Diagnosis not present

## 2020-02-24 DIAGNOSIS — I890 Lymphedema, not elsewhere classified: Secondary | ICD-10-CM

## 2020-02-26 ENCOUNTER — Encounter (INDEPENDENT_AMBULATORY_CARE_PROVIDER_SITE_OTHER): Payer: Self-pay | Admitting: Nurse Practitioner

## 2020-02-26 NOTE — Progress Notes (Addendum)
Subjective:    Patient ID: Tonya Lawson, female    DOB: 05-Oct-1960, 59 y.o.   MRN: [id number] Chief Complaint  Patient presents with  . Follow-up    BIL LE VEN reflux    Tonya Lawson is a 59 y.o. female.  Patient presents today for evaluation of lower extremity edema.  The patient has an intellectual disability and is nonverbal, her caregivers present and giving her history today.  The patient currently lives in a group home and has had swelling in her bilateral lower extremities since she has been at that group home for about 2 years.  Both lower extremities swell but the left is worse.  After the previous office visit, the patient began utilizing medical grade 1 compression stockings that she was able to tolerate and the swelling has been better.  She does elevate her lower extremities however she does need frequent reminders.  She does not walk frequently however caregiver states that they will begin walking on a daily basis.  She denies any ulceration or infections.  Caregiver denies any weeping or chest pain or shortness of breath.  Today noninvasive studies reveal no evidence of DVT bilaterally.  There is no evidence of superficial venous thrombosis bilaterally.  No evidence of deep venous insufficiency bilaterally.  No evidence of superficial venous reflux seen in the short or great saphenous veins bilaterally.   Review of Systems  Cardiovascular: Positive for leg swelling.  All other systems reviewed and are negative.      Objective:   Physical Exam Vitals reviewed.  HENT:     Head: Normocephalic.  Cardiovascular:     Rate and Rhythm: Normal rate and regular rhythm.     Pulses: Normal pulses.     Heart sounds: Normal heart sounds.  Pulmonary:     Effort: Pulmonary effort is normal.     Breath sounds: Normal breath sounds.  Musculoskeletal:     Right lower leg: 2+ Edema present.     Left lower leg: 2+ Edema present.  Neurological:     Mental Status: She is alert.  Mental status is at baseline.  Psychiatric:        Attention and Perception: She is inattentive.        Speech: She is noncommunicative.     BP 123/89   Pulse 93   Ht 4\' 10"  (1.473 m)   Wt 149 lb (67.6 kg)   BMI 31.14 kg/m   Past Medical History:  Diagnosis Date  . DNR (do not resuscitate) 08/29/2017  . Hypercholesterolemia   . Hypertension   . Kyphosis of thoracic region   . Mental retardation   . Osteoporosis     Social History   Socioeconomic History  . Marital status: Unknown    Spouse name: Not on file  . Number of children: Not on file  . Years of education: Not on file  . Highest education level: Not on file  Occupational History  . Not on file  Tobacco Use  . Smoking status: Never Smoker  . Smokeless tobacco: Never Used  Vaping Use  . Vaping Use: Never used  Substance and Sexual Activity  . Alcohol use: No  . Drug use: No  . Sexual activity: Never  Other Topics Concern  . Not on file  Social History Narrative  . Not on file   Social Determinants of Health   Financial Resource Strain:   . Difficulty of Paying Living Expenses:   Food Insecurity:   .  Worried About Charity fundraiser in the Last Year:   . Arboriculturist in the Last Year:   Transportation Needs:   . Film/video editor (Medical):   Marland Kitchen Lack of Transportation (Non-Medical):   Physical Activity:   . Days of Exercise per Week:   . Minutes of Exercise per Session:   Stress:   . Feeling of Stress :   Social Connections:   . Frequency of Communication with Friends and Family:   . Frequency of Social Gatherings with Friends and Family:   . Attends Religious Services:   . Active Member of Clubs or Organizations:   . Attends Archivist Meetings:   Marland Kitchen Marital Status:   Intimate Partner Violence:   . Fear of Current or Ex-Partner:   . Emotionally Abused:   Marland Kitchen Physically Abused:   . Sexually Abused:     Past Surgical History:  Procedure Laterality Date  . ABDOMINAL  HYSTERECTOMY     completed  . COLONOSCOPY WITH PROPOFOL N/A 11/24/2019   Procedure: COLONOSCOPY WITH PROPOFOL;  Surgeon: Jonathon Bellows, MD;  Location: [facility] END[facility]COPY;  Service: Gastroenterology;  Laterality: N/A;    Family History  Problem Relation Age of Onset  . Diabetes Mother   . Stroke Mother   . Cancer Father        brain  . Breast cancer Maternal Aunt 70  . Cancer Brother     No Known Allergies     Assessment & Plan:   1. Lymphedema I have had a long discussion with the patient regarding swelling and why it  causes symptoms.  Patient will begin wearing graduated compression stockings class 1 (20-30 mmHg) on a daily basis a prescription was given. The patient will  beginning wearing the stockings first thing in the morning and removing them in the evening. The patient is instructed specifically not to sleep in the stockings.   In addition, behavioral modification will be initiated.  This will include frequent elevation, use of over the counter pain medications and exercise such as walking.  I have reviewed systemic causes for chronic edema such as liver, kidney and cardiac etiologies.  The patient denies problems with these organ systems.    Consideration for a lymph pump will also be made based upon the effectiveness of conservative therapy.  This would help to improve the edema control and prevent sequela such as ulcers and infections   Patient will follow up in 6 months   2. Essential hypertension Continue antihypertensive medications as already ordered, these medications have been reviewed and there are no changes at this time.   3. Dyslipidemia Continue statin as ordered and reviewed, no changes at this time    Current Outpatient Medications on File Prior to Visit  Medication Sig Dispense Refill  . acetaminophen (TYLENOL) 500 MG tablet Take 1 tablet (500 mg total) by mouth every 6 (six) hours as needed. 30 tablet 0  . aspirin (ASPIRIN LOW DOSE) 81 MG EC tablet  Take 1 tablet (81 mg total) by mouth daily. Swallow whole. 30 tablet 11  . Calcium Carbonate-Vitamin D 600-400 MG-UNIT tablet Take 1 tablet by mouth 2 (two) times daily. 60 tablet 11  . Cranberry-Vitamin C-Vitamin E 4200-20-3 MG-MG-UNIT CAPS Take 1 tablet by mouth daily. 30 capsule 11  . hydrOXYzine (VISTARIL) 25 MG capsule Take 1 capsule (25 mg total) by mouth every 8 (eight) hours as needed for anxiety (anxiety or aggitation, can use before going to dentist). 30 capsule  0  . lisinopril-hydrochlorothiazide (ZESTORETIC) 10-12.5 MG tablet Take 0.5 tablets by mouth daily. 30 tablet 11  . loratadine (CLARITIN) 10 MG tablet Take 1 tablet (10 mg total) by mouth daily as needed for allergies. 90 tablet 3  . Multiple Vitamins-Minerals (MULTIVITAMIN WITH MINERALS) tablet Take 1 tablet by mouth daily. 30 tablet 11  . pravastatin (PRAVACHOL) 20 MG tablet Take 1 tablet (20 mg total) by mouth daily. 31 tablet 11  . triamcinolone cream (KENALOG) 0.1 % Apply 1 application topically 2 (two) times daily. Behind the right ear as needed 30 g 0  . vitamin B-12 (CYANOCOBALAMIN) 100 MCG tablet TAKE 1 TABLET BY MOUTH ONCE DAILY FOR SUPPLEMENT 30 tablet 11   No current facility-administered medications on file prior to visit.    There are no Patient Instructions on file for this visit. No follow-ups on file.   Kris Hartmann, NP

## 2020-03-23 ENCOUNTER — Other Ambulatory Visit: Payer: Self-pay | Admitting: Family Medicine

## 2020-03-23 NOTE — Progress Notes (Signed)
  Received requests from pt pharmacy and from [facility] for 2 Day Physician Renewal Order  Reviewed med list, discontinued all unregulated supplements that are not medically indicated or standard of care for patient.  Reviewed patients bleed risk with ASA compared to benefit and ASCVD risk, risk of ASA is greater than benefit and not indicated for this patient.  ASA was discontinued on chart, annotated in 90 day order letter and additional letter was written for facility with directions to discontinue ASA.   Current Outpatient Medications:  .  acetaminophen (TYLENOL) 500 MG tablet, Take 1 tablet (500 mg total) by mouth every 6 (six) hours as needed., Disp: 30 tablet, Rfl: 0 .  Calcium Carbonate-Vitamin D 600-400 MG-UNIT tablet, Take 1 tablet by mouth 2 (two) times daily., Disp: 60 tablet, Rfl: 11 .  Cranberry-Vitamin C-Vitamin E 4200-20-3 MG-MG-UNIT CAPS, Take 1 tablet by mouth daily., Disp: 30 capsule, Rfl: 11 .  hydrOXYzine (VISTARIL) 25 MG capsule, Take 1 capsule (25 mg total) by mouth every 8 (eight) hours as needed for anxiety (anxiety or aggitation, can use before going to dentist)., Disp: 30 capsule, Rfl: 0 .  lisinopril-hydrochlorothiazide (ZESTORETIC) 10-12.5 MG tablet, Take 0.5 tablets by mouth daily., Disp: 30 tablet, Rfl: 11 .  loratadine (CLARITIN) 10 MG tablet, Take 1 tablet (10 mg total) by mouth daily as needed for allergies., Disp: 90 tablet, Rfl: 3 .  Multiple Vitamins-Minerals (MULTIVITAMIN WITH MINERALS) tablet, Take 1 tablet by mouth daily., Disp: 30 tablet, Rfl: 11 .  pravastatin (PRAVACHOL) 20 MG tablet, Take 1 tablet (20 mg total) by mouth daily., Disp: 31 tablet, Rfl: 11 .  triamcinolone cream (KENALOG) 0.1 %, Apply 1 application topically 2 (two) times daily. Behind the right ear as needed, Disp: 30 g, Rfl: 0 .  vitamin B-12 (CYANOCOBALAMIN) 100 MCG tablet, TAKE 1 TABLET BY MOUTH ONCE DAILY FOR SUPPLEMENT, Disp: 30 tablet, Rfl: 11   Delsa Grana, PA-C

## 2020-04-23 ENCOUNTER — Ambulatory Visit (INDEPENDENT_AMBULATORY_CARE_PROVIDER_SITE_OTHER): Payer: Medicare Other | Admitting: Family Medicine

## 2020-04-23 ENCOUNTER — Encounter: Payer: Self-pay | Admitting: Family Medicine

## 2020-04-23 ENCOUNTER — Other Ambulatory Visit: Payer: Self-pay

## 2020-04-23 VITALS — BP 110/70 | HR 90 | Temp 98.6°F | Resp 16 | Ht <= 58 in | Wt 146.7 lb

## 2020-04-23 DIAGNOSIS — R198 Other specified symptoms and signs involving the digestive system and abdomen: Secondary | ICD-10-CM | POA: Diagnosis not present

## 2020-04-23 MED ORDER — PANTOPRAZOLE SODIUM 20 MG PO TBEC: 20.0000 mg | DELAYED_RELEASE_TABLET | ORAL | 0 refills | Status: DC

## 2020-04-23 NOTE — Progress Notes (Signed)
Patient ID: MAHARI VANKIRK, female    DOB: [birth year], 59 y.o.   MRN: [id number]  PCP: Delsa Grana, PA-C  Chief Complaint  Patient presents with  . Gastroesophageal Reflux    vomiting after eating    Subjective:   Tonya Lawson is a 59 y.o. female, presents to clinic with CC of the following:  HPI   Gagging? Vomiting vs regurg after eating, same sx as sister, IDD, lives in group home, brought in by care giver today, they are not sure if it is a behavioral thing or sx/illness. Ongoing for many years - Letta Median reports that gagging usually occurs around meal time when pts are told no about something. No observed change in appetite, weight, no visualization of any emesis or vomiting, mostly spitting saliva. Weight only a few lbs less than last visit 2 months ago  Wt Readings from Last 5 Encounters:  04/23/20 146 lb 11.2 oz (66.5 kg)  02/24/20 149 lb (67.6 kg)  02/17/20 148 lb 6.4 oz (67.3 kg)  01/21/20 148 lb 4.8 oz (67.3 kg)  10/24/19 151 lb 3.2 oz (68.6 kg)   BMI Readings from Last 5 Encounters:  04/23/20 30.66 kg/m  02/24/20 31.14 kg/m  02/17/20 27.14 kg/m  01/21/20 27.12 kg/m  10/24/19 27.65 kg/m     Patient Active Problem List   Diagnosis Date Noted  . Swelling of limb 02/17/2020  . Dyslipidemia 10/10/2018  . Dental caries 10/10/2018  . Overweight (BMI 25.0-29.9) 02/01/2018  . Colon cancer screening 08/29/2017  . DNR (do not resuscitate) 08/29/2017  . Hypertension   . Osteoporosis   . Intellectual disability   . Kyphosis of thoracic region       Current Outpatient Medications:  .  acetaminophen (TYLENOL) 500 MG tablet, Take 1 tablet (500 mg total) by mouth every 6 (six) hours as needed., Disp: 30 tablet, Rfl: 0 .  Calcium Carbonate-Vitamin D 600-400 MG-UNIT tablet, Take 1 tablet by mouth 2 (two) times daily., Disp: 60 tablet, Rfl: 11 .  Cranberry-Vitamin C-Vitamin E 4200-20-3 MG-MG-UNIT CAPS, Take 1 tablet by mouth daily., Disp: 30 capsule, Rfl:  11 .  hydrOXYzine (VISTARIL) 25 MG capsule, Take 1 capsule (25 mg total) by mouth every 8 (eight) hours as needed for anxiety (anxiety or aggitation, can use before going to dentist)., Disp: 30 capsule, Rfl: 0 .  lisinopril-hydrochlorothiazide (ZESTORETIC) 10-12.5 MG tablet, Take 0.5 tablets by mouth daily., Disp: 30 tablet, Rfl: 11 .  loratadine (CLARITIN) 10 MG tablet, Take 1 tablet (10 mg total) by mouth daily as needed for allergies., Disp: 90 tablet, Rfl: 3 .  Multiple Vitamins-Minerals (MULTIVITAMIN WITH MINERALS) tablet, Take 1 tablet by mouth daily., Disp: 30 tablet, Rfl: 11 .  pravastatin (PRAVACHOL) 20 MG tablet, Take 1 tablet (20 mg total) by mouth daily., Disp: 31 tablet, Rfl: 11 .  triamcinolone cream (KENALOG) 0.1 %, Apply 1 application topically 2 (two) times daily. Behind the right ear as needed, Disp: 30 g, Rfl: 0 .  vitamin B-12 (CYANOCOBALAMIN) 100 MCG tablet, TAKE 1 TABLET BY MOUTH ONCE DAILY FOR SUPPLEMENT, Disp: 30 tablet, Rfl: 11   No Known Allergies   Social History   Tobacco Use  . Smoking status: Never Smoker  . Smokeless tobacco: Never Used  Vaping Use  . Vaping Use: Never used  Substance Use Topics  . Alcohol use: No  . Drug use: No      Chart Review Today: I personally reviewed active problem list, medication list, allergies, family  history, social history, health maintenance, notes from last encounter, lab results, imaging with the patient/caregiver today.   Review of Systems Hx limited by pt mental status and limited knowledge by caregiver 10 Systems reviewed and are negative for acute change except as noted in the HPI.     Objective:   Vitals:   04/23/20 1036  BP: 110/70  Pulse: 90  Resp: 16  Temp: 98.6 F (37 C)  TempSrc: Oral  SpO2: 96%  Weight: 146 lb 11.2 oz (66.5 kg)  Height: 4\' 10"  (1.473 m)    Body mass index is 30.66 kg/m.  Physical Exam Vitals and nursing note reviewed.  Constitutional:      General: She is not in acute  distress.    Appearance: Normal appearance. She is not toxic-appearing.  HENT:     Head: Normocephalic and atraumatic.     Right Ear: External ear normal.     Left Ear: External ear normal.     Nose: Nose normal.     Mouth/Throat:     Pharynx: Oropharynx is clear.  Cardiovascular:     Rate and Rhythm: Normal rate and regular rhythm.     Pulses: Normal pulses.     Heart sounds: Normal heart sounds.  Pulmonary:     Effort: Pulmonary effort is normal. No respiratory distress.     Breath sounds: Normal breath sounds. No stridor. No wheezing or rales.  Abdominal:     General: Bowel sounds are normal. There is no distension.     Palpations: Abdomen is soft.     Tenderness: There is no abdominal tenderness. There is no right CVA tenderness, left CVA tenderness, guarding or rebound.  Neurological:     Mental Status: She is alert. Mental status is at baseline.      Results for orders placed or performed in visit on 01/21/20  COMPLETE METABOLIC PANEL WITH GFR  Result Value Ref Range   Glucose, Bld 75 65 - 99 mg/dL   BUN 12 7 - 25 mg/dL   Creat 0.90 0.50 - 1.05 mg/dL   GFR, Est Non African American 70 > OR = 60 mL/min/1.61m2   GFR, Est African American 82 > OR = 60 mL/min/1.70m2   BUN/Creatinine Ratio NOT APPLICABLE 6 - 22 (calc)   Sodium 141 135 - 146 mmol/L   Potassium 4.6 3.5 - 5.3 mmol/L   Chloride 105 98 - 110 mmol/L   CO2 29 20 - 32 mmol/L   Calcium 9.8 8.6 - 10.4 mg/dL   Total Protein 6.4 6.1 - 8.1 g/dL   Albumin 4.1 3.6 - 5.1 g/dL   Globulin 2.3 1.9 - 3.7 g/dL (calc)   AG Ratio 1.8 1.0 - 2.5 (calc)   Total Bilirubin 0.3 0.2 - 1.2 mg/dL   Alkaline phosphatase (APISO) 66 37 - 153 U/L   AST 15 10 - 35 U/L   ALT 11 6 - 29 U/L       Assessment & Plan:   Pt and her sister present for evaluation of gagging and spitting - mostly sounds like years of a voluntary action when upset at the table - they present from group home to determine is its "a problem or a behavior".  Pt and  sister have done this for years, normal appetite, weight, no abd tenderness - no other signs or sx of severe upper GI sx or dysphagia.  They are seeing a psychiatrist, I suspect it is a behavior and not due to a physical ailment or  dx.  Can try ppi trial and observe - it would be helpful if caregiver was able to report on any spitting emesis appearance, stool/BM frequency consistency, general appetite observations.      Meds ordered this encounter  Medications  . pantoprazole (PROTONIX) 20 MG tablet    Sig: Take 1 tablet (20 mg total) by mouth every morning for 28 days. One hour before breakfast    Dispense:  28 tablet    Refill:  0    For possible reflux, ppi trial x 4 weeks    Order Specific Question:   Supervising Provider    Answer:   Steele Sizer [[id number]]   Instructions via AVS: Try pantoprazole one pill each morning for 4 weeks and we will do a follow up visit to see if this helped with symptoms.    At follow up appointment we will decide if we need to continue the medicine every day or not.    For follow up appointment please observe the few days before appointment how patient is eating, if stool is normal, note any episodes of vomiting or diarrhea.     Delsa Grana, PA-C 04/23/20 10:51 AM

## 2020-04-23 NOTE — Patient Instructions (Signed)
Try pantoprazole one pill each morning for 4 weeks and we will do a follow up visit to see if this helped with symptoms.    At follow up appointment we will decide if we need to continue the medicine every day or not.    For follow up appointment please observe the few days before appointment how patient is eating, if stool is normal, note any episodes of vomiting or diarrhea.

## 2020-05-18 ENCOUNTER — Telehealth: Payer: Self-pay | Admitting: Family Medicine

## 2020-05-18 NOTE — Telephone Encounter (Signed)
Tonya Lawson, Patient's caregiver is calling because the pantoprazole (PROTONIX) 20 MG tablet [[id number]]IQY not really seem to help the patient. [name] is thinking it is a behavior.  [name] is thinking that the medication can be d/c.  She will let her finish the medication and then discontinue the medication. [name] is requesting a d/c note. Please advise when ready she can come pick it up [phone]

## 2020-05-21 NOTE — Telephone Encounter (Signed)
Faye walker notified

## 2020-05-25 NOTE — Telephone Encounter (Addendum)
Tonya Lawson needs information in writing. Tonya Lawson would like to know if dry heaving is due to behavioral or medical issue. The protonix has been d/c also does pt needs another appt to issue

## 2020-06-23 IMAGING — CR DG HIP (WITH OR WITHOUT PELVIS) 2-3V*L*
1 series · 3 of 3 positions shown · non-contrast
Comparison: None.

CLINICAL DATA: Patient fell from [facility] on a concrete
surface on [DATE] and persistent pain and bruising over the
buttocks.

EXAM:
DG HIP (WITH OR WITHOUT PELVIS) 2-3V RIGHT; DG HIP (WITH OR WITHOUT
PELVIS) 2-3V LEFT

[Series 1: dg hip unilat w or w/o pelvis 2-3 views  · non-contrast · 0.14mm/px · 3 of 3 slices shown]
[im 1/3]
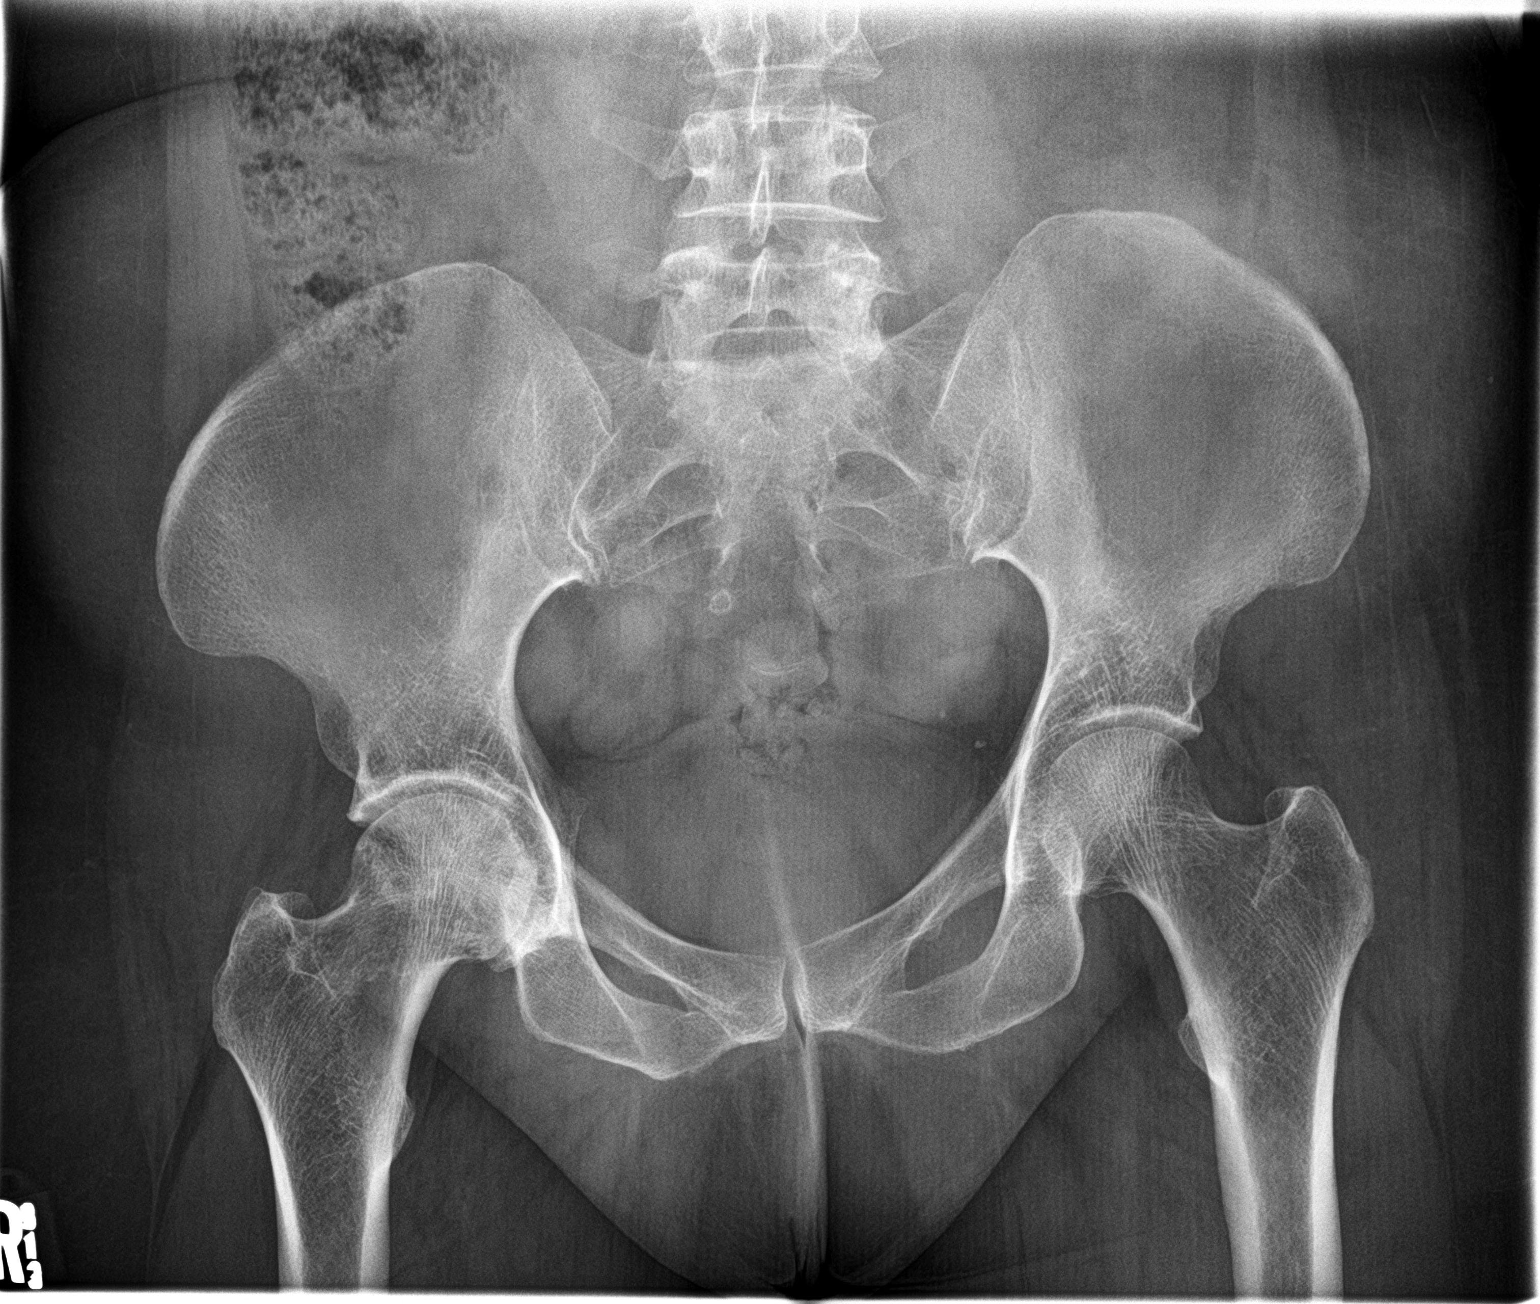
[im 2/3]
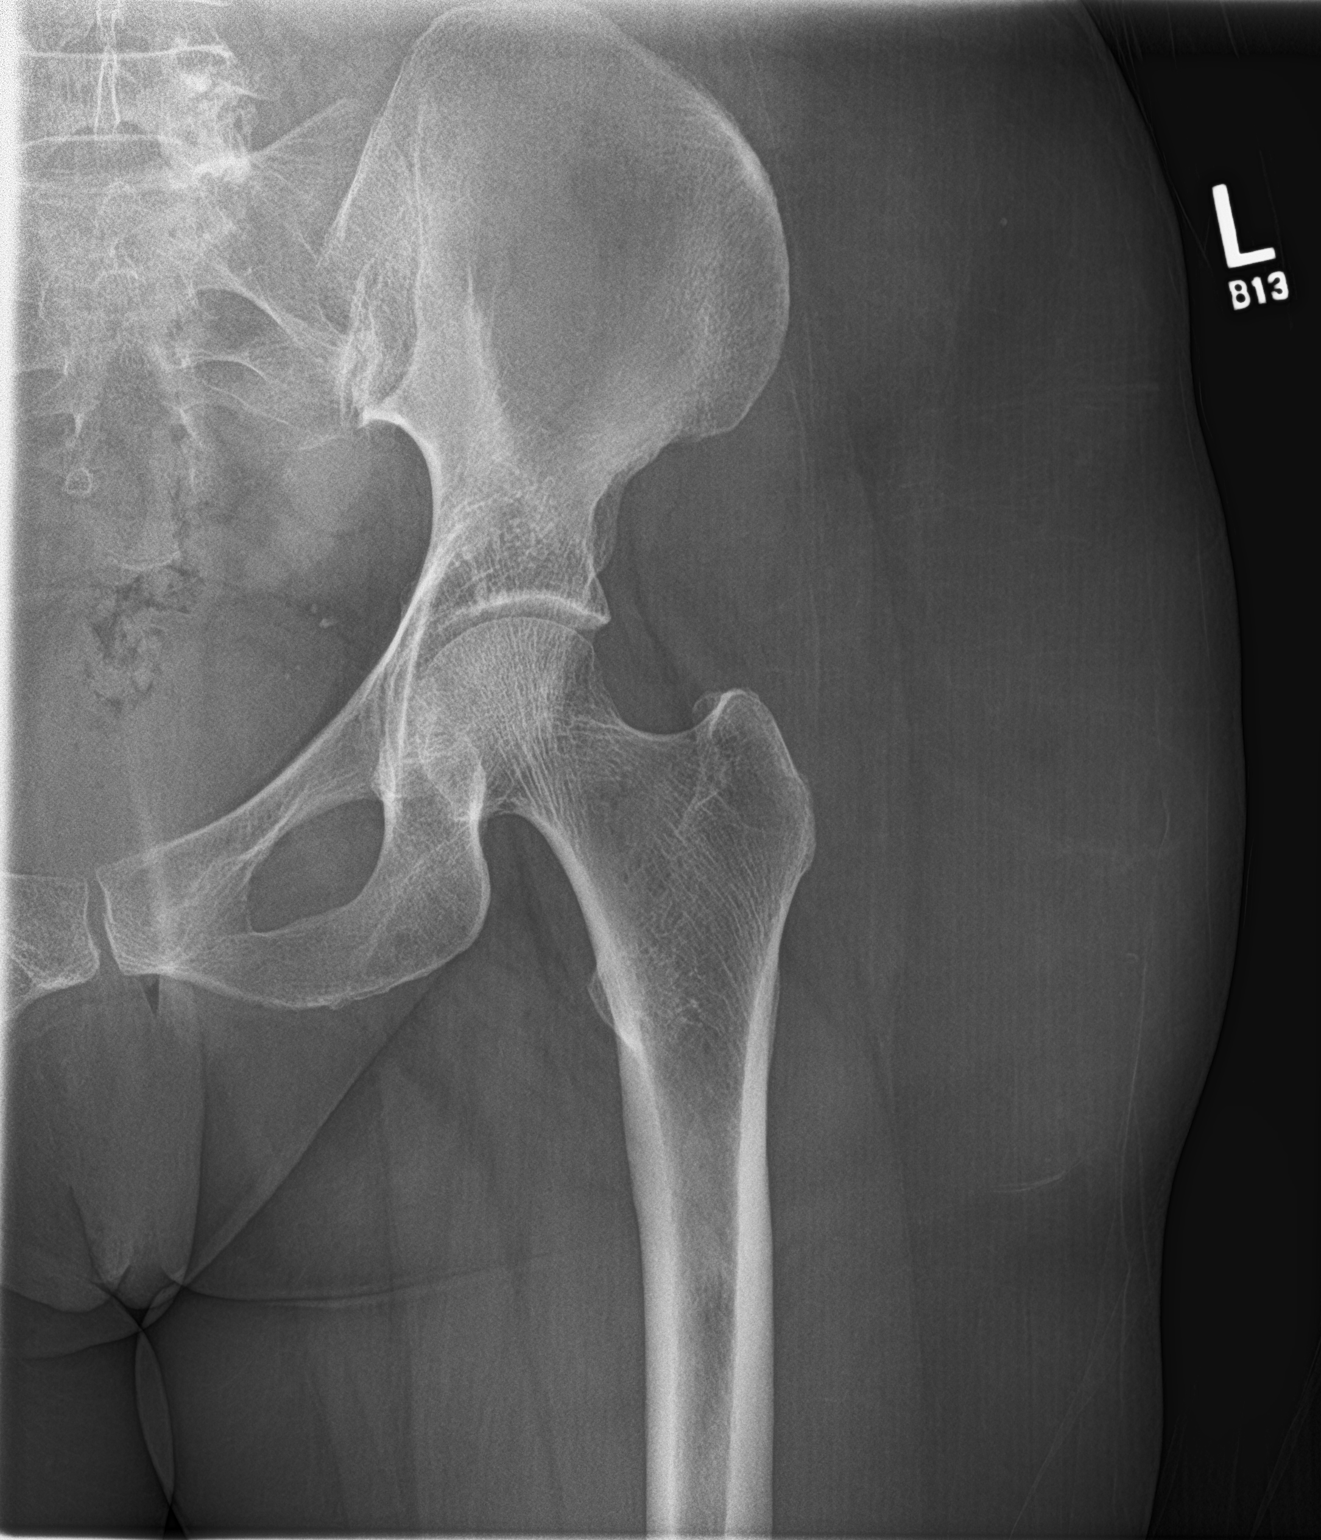
[im 3/3]
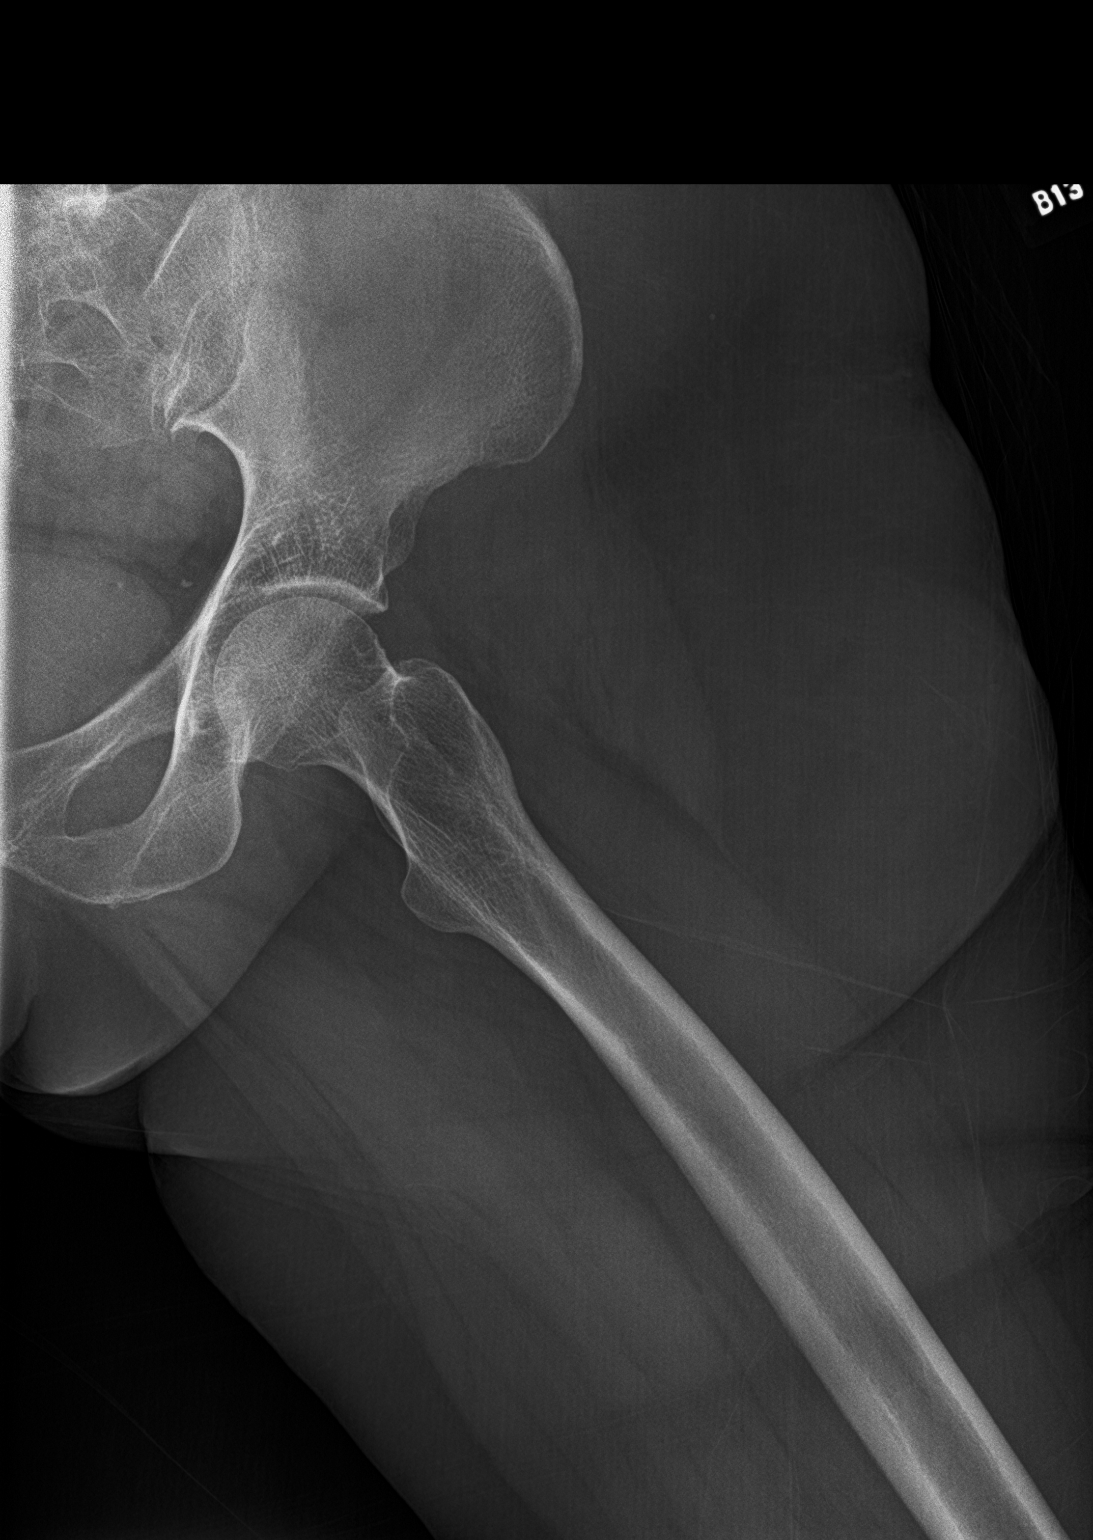

[3 of 3 positions shown; findings below may reference images not displayed]

FINDINGS: The bony pelvis is subjectively adequately mineralized and is
intact. The observed portions of the sacrum are normal. AP and
lateral views of both hips reveal no acute fractures. There is
preservation of the joint spaces. There is scleroses within the
femoral head on the right with areas of subtle lucency in as well.
On the left mineralization of the femoral head is homogeneous. The
articular surfaces of the femoral heads and acetabuli E remains
smoothly rounded. The femoral necks, intertrochanteric, and
subtrochanteric regions are normal.
IMPRESSION: No acute fracture or dislocation of either hip. The right femoral
head exhibits heterogeneous mineralization which is nonspecific and
may reflect reactive changes of osteo arthritis, avascular necrosis,
or marrow replacing process. Given the lack of any previous studies,
MRI of the right hip is recommended.

## 2020-07-07 ENCOUNTER — Encounter: Payer: Self-pay | Admitting: Family Medicine

## 2020-07-07 ENCOUNTER — Ambulatory Visit (INDEPENDENT_AMBULATORY_CARE_PROVIDER_SITE_OTHER): Payer: Medicare Other | Admitting: Family Medicine

## 2020-07-07 ENCOUNTER — Encounter: Payer: Medicare Other | Admitting: Family Medicine

## 2020-07-07 ENCOUNTER — Other Ambulatory Visit: Payer: Self-pay

## 2020-07-07 VITALS — BP 120/76 | HR 96 | Temp 98.3°F | Resp 16 | Ht <= 58 in | Wt 147.0 lb

## 2020-07-07 DIAGNOSIS — M810 Age-related osteoporosis without current pathological fracture: Secondary | ICD-10-CM

## 2020-07-07 DIAGNOSIS — Z0000 Encounter for general adult medical examination without abnormal findings: Secondary | ICD-10-CM | POA: Diagnosis not present

## 2020-07-07 DIAGNOSIS — Z23 Encounter for immunization: Secondary | ICD-10-CM

## 2020-07-07 NOTE — Patient Instructions (Addendum)
Health Maintenance  Topic Date Due  . DEXA scan (bone density measurement)  12/20/2019  . Mammogram  12/04/2020  . Tetanus Vaccine  12/14/2027  . Colon Cancer Screening  11/23/2029  . Flu Shot  Completed  . COVID-19 Vaccine  Completed  .  Hepatitis C: One time screening is recommended by Center for Disease Control  (CDC) for  adults born from [birth year] through 1965.   Completed  . HIV Screening  Completed   Bone Scan ordered - please call below to schedule the scan  [facility] at [facility] [place] [place],  [place]  [id number] Get Driving Directions Main: [phone]  We recommend the shingrix shot to protect against shingles Call and see if insurance will cover shingrix in our office or if they need it done at a pharmacy    Preventive Care 32-76 Years Old, Female Preventive care refers to visits with your health care provider and lifestyle choices that can promote health and wellness. This includes:  A yearly physical exam. This may also be called an annual well check.  Regular dental visits and eye exams.  Immunizations.  Screening for certain conditions.  Healthy lifestyle choices, such as eating a healthy diet, getting regular exercise, not using drugs or products that contain nicotine and tobacco, and limiting alcohol use. What can I expect for my preventive care visit? Physical exam Your health care provider will check your:  Height and weight. This may be used to calculate body mass index (BMI), which tells if you are at a healthy weight.  Heart rate and blood pressure.  Skin for abnormal spots. Counseling Your health care provider may ask you questions about your:  Alcohol, tobacco, and drug use.  Emotional well-being.  Home and relationship well-being.  Sexual activity.  Eating habits.  Work and work Statistician.  Method of birth control.  Menstrual cycle.  Pregnancy history. What immunizations do I need?  Influenza  (flu) vaccine  This is recommended every year. Tetanus, diphtheria, and pertussis (Tdap) vaccine  You may need a Td booster every 10 years. Varicella (chickenpox) vaccine  You may need this if you have not been vaccinated. Zoster (shingles) vaccine  You may need this after age 37. Measles, mumps, and rubella (MMR) vaccine  You may need at least one dose of MMR if you were born in 1957 or later. You may also need a second dose. Pneumococcal conjugate (PCV13) vaccine  You may need this if you have certain conditions and were not previously vaccinated. Pneumococcal polysaccharide (PPSV23) vaccine  You may need one or two doses if you smoke cigarettes or if you have certain conditions. Meningococcal conjugate (MenACWY) vaccine  You may need this if you have certain conditions. Hepatitis A vaccine  You may need this if you have certain conditions or if you travel or work in places where you may be exposed to hepatitis A. Hepatitis B vaccine  You may need this if you have certain conditions or if you travel or work in places where you may be exposed to hepatitis B. Haemophilus influenzae type b (Hib) vaccine  You may need this if you have certain conditions. Human papillomavirus (HPV) vaccine  If recommended by your health care provider, you may need three doses over 6 months. You may receive vaccines as individual doses or as more than one vaccine together in one shot (combination vaccines). Talk with your health care provider about the risks and benefits of combination vaccines. What tests  do I need? Blood tests  Lipid and cholesterol levels. These may be checked every 5 years, or more frequently if you are over 49 years old.  Hepatitis C test.  Hepatitis B test. Screening  Lung cancer screening. You may have this screening every year starting at age 83 if you have a 30-pack-year history of smoking and currently smoke or have quit within the past 15 years.  Colorectal  cancer screening. All adults should have this screening starting at age 58 and continuing until age 52. Your health care provider may recommend screening at age 51 if you are at increased risk. You will have tests every 1-10 years, depending on your results and the type of screening test.  Diabetes screening. This is done by checking your blood sugar (glucose) after you have not eaten for a while (fasting). You may have this done every 1-3 years.  Mammogram. This may be done every 1-2 years. Talk with your health care provider about when you should start having regular mammograms. This may depend on whether you have a family history of breast cancer.  BRCA-related cancer screening. This may be done if you have a family history of breast, ovarian, tubal, or peritoneal cancers.  Pelvic exam and Pap test. This may be done every 3 years starting at age 22. Starting at age 48, this may be done every 5 years if you have a Pap test in combination with an HPV test. Other tests  Sexually transmitted disease (STD) testing.  Bone density scan. This is done to screen for osteoporosis. You may have this scan if you are at high risk for osteoporosis. Follow these instructions at home: Eating and drinking  Eat a diet that includes fresh fruits and vegetables, whole grains, lean protein, and low-fat dairy.  Take vitamin and mineral supplements as recommended by your health care provider.  Do not drink alcohol if: ? Your health care provider tells you not to drink. ? You are pregnant, may be pregnant, or are planning to become pregnant.  If you drink alcohol: ? Limit how much you have to 0-1 drink a day. ? Be aware of how much alcohol is in your drink. In the [place] one drink equals one 12 oz bottle of beer (355 mL), one 5 oz glass of wine (148 mL), or one 1 oz glass of hard liquor (44 mL). Lifestyle  Take daily care of your teeth and gums.  Stay active. Exercise for at least 30 minutes on 5 or more  days each week.  Do not use any products that contain nicotine or tobacco, such as cigarettes, e-cigarettes, and chewing tobacco. If you need help quitting, ask your health care provider.  If you are sexually active, practice safe sex. Use a condom or other form of birth control (contraception) in order to prevent pregnancy and STIs (sexually transmitted infections).  If told by your health care provider, take low-dose aspirin daily starting at age 52. What's next?  Visit your health care provider once a year for a well check visit.  Ask your health care provider how often you should have your eyes and teeth checked.  Stay up to date on all vaccines. This information is not intended to replace advice given to you by your health care provider. Make sure you discuss any questions you have with your health care provider. Document Revised: 05/09/2018 Document Reviewed: 05/09/2018 Elsevier Patient Education  2020 [employer].

## 2020-07-07 NOTE — Progress Notes (Signed)
Patient: Tonya Lawson, Female    DOB: 11/15/1960, 59 y.o.   MRN: [id number] Delsa Grana, PA-C Visit Date: 07/07/2020  Today's Provider: Delsa Grana, PA-C   Chief Complaint  Patient presents with  . Annual Exam   Subjective:   Annual physical exam:  Tonya Lawson is a 59 y.o. female who presents today for complete physical exam:  Exercise/Activity:  Not much physical activity at all Diet/nutrition:  Good appetite, balanced meals Sleep: 9:30 pm to 6 am - sleeps good Good behavior  USPSTF grade A and B recommendations - reviewed and addressed today  Depression:  Phq 9 completed today by patient, was reviewed by me with patient in the room PHQ score is neg, pt feels good - in good mood today, pleasant PHQ 2/9 Scores 07/07/2020 04/23/2020 01/21/2020 10/24/2019  PHQ - 2 Score 0 0 - -  PHQ- 9 Score - 0 - -  Exception Documentation - - Medical reason Other- indicate reason in comment box   Depression screen [facility] 2/9 07/07/2020 04/23/2020 06/17/2019 04/23/2019 01/08/2019  Decreased Interest 0 0 0 0 0  Down, Depressed, Hopeless 0 0 0 0 0  PHQ - 2 Score 0 0 0 0 0  Altered sleeping - 0 0 0 0  Tired, decreased energy - 0 0 0 0  Change in appetite - 0 0 0 0  Feeling bad or failure about yourself  - 0 0 0 0  Trouble concentrating - 0 0 0 0  Moving slowly or fidgety/restless - 0 0 0 0  Suicidal thoughts - 0 0 0 0  PHQ-9 Score - 0 0 0 0  Difficult doing work/chores - Not difficult at all - Not difficult at all Not difficult at all  Some recent data might be hidden    Alcohol screening:   Office Visit from 07/07/2020 in [facility]  AUDIT-C Score 0      Immunizations and Health Maintenance: Health Maintenance  Topic Date Due  . MAMMOGRAM  12/04/2020  . TETANUS/TDAP  12/14/2027  . COLONOSCOPY  11/23/2029  . INFLUENZA VACCINE  Completed  . COVID-19 Vaccine  Completed  . Hepatitis C Screening  Completed  . HIV Screening  Completed     Hep C  Screening: UTD  STD testing and prevention (HIV/chl/gon/syphilis):  Done in the past, no further screening indicated  Intimate partner violence: n/a safe at adult group home/facility  Sexual History/Pain during Intercourse: n/a  Menstrual History/LMP/Abnormal Bleeding:  No LMP recorded. Patient has had a hysterectomy.  Incontinence Symptoms:  None   Breast cancer:  UTD on mammogram Last Mammogram: *see HM list above BRCA gene screening: none  Cervical cancer screening: n/a due to hysterectomy No family hx of cancers - breast, ovarian, uterine, colon:     Osteoporosis:   Discussion on osteoporosis per age, including high calcium and vitamin D supplementation, weight bearing exercises Pt is one supplementing with daily calcium/Vit D. 12/2017 Bone scan/dexa - with Dx of osteoporosis - due for f/up  Skin cancer:  Hx of skin CA -  NO Discussed atypical lesions   Colorectal cancer:   Colonoscopy is UTD this march Discussed concerning signs and sx of CRC, pt denies change in BM - pt unable to report further on sx  Lung cancer:   Low Dose CT Chest recommended if Age 58-80 years, 30 pack-year currently smoking OR have quit w/in 15years. Patient does not qualify.    Social History   Tobacco Use  .  Smoking status: Never Smoker  . Smokeless tobacco: Never Used  Vaping Use  . Vaping Use: Never used  Substance Use Topics  . Alcohol use: No  . Drug use: No       Office Visit from 07/07/2020 in [facility]  AUDIT-C Score 0      Family History  Problem Relation Age of Onset  . Diabetes Mother   . Stroke Mother   . Cancer Father        brain  . Breast cancer Maternal Aunt 70  . Cancer Brother      Blood pressure/Hypertension: BP Readings from Last 3 Encounters:  07/07/20 120/76  04/23/20 110/70  02/24/20 123/89    Weight/Obesity: Wt Readings from Last 3 Encounters:  07/07/20 147 lb (66.7 kg)  04/23/20 146 lb 11.2 oz (66.5 kg)  02/24/20 149 lb  (67.6 kg)   BMI Readings from Last 3 Encounters:  07/07/20 30.72 kg/m  04/23/20 30.66 kg/m  02/24/20 31.14 kg/m     Lipids:  Lab Results  Component Value Date   CHOL 158 10/24/2019   CHOL 161 06/17/2019   CHOL 165 04/09/2018   Lab Results  Component Value Date   HDL 61 10/24/2019   HDL 63 06/17/2019   HDL 60 04/09/2018   Lab Results  Component Value Date   LDLCALC 71 10/24/2019   LDLCALC 76 06/17/2019   LDLCALC 77 04/09/2018   Lab Results  Component Value Date   TRIG 189 (H) 10/24/2019   TRIG 136 06/17/2019   TRIG 188 (H) 04/09/2018   Lab Results  Component Value Date   CHOLHDL 2.6 10/24/2019   CHOLHDL 2.6 06/17/2019   CHOLHDL 2.8 04/09/2018   No results found for: LDLDIRECT Based on the results of lipid panel his/her cardiovascular risk factor ( using [place] Cohort )  in the next 10 years is: The ASCVD Risk score Denman George DC Jr., et al., 2013) failed to calculate for the following reasons:   Unable to determine if patient is Non-Hispanic African American Glucose:  Glucose, Bld  Date Value Ref Range Status  01/21/2020 75 65 - 99 mg/dL Final    Comment:    .            Fasting reference interval .   10/24/2019 91 65 - 99 mg/dL Final    Comment:    .            Fasting reference interval .   06/17/2019 78 65 - 99 mg/dL Final    Comment:    .            Fasting reference interval .    Hypertension: BP Readings from Last 3 Encounters:  07/07/20 120/76  04/23/20 110/70  02/24/20 123/89   Obesity: Wt Readings from Last 3 Encounters:  07/07/20 147 lb (66.7 kg)  04/23/20 146 lb 11.2 oz (66.5 kg)  02/24/20 149 lb (67.6 kg)   BMI Readings from Last 3 Encounters:  07/07/20 30.72 kg/m  04/23/20 30.66 kg/m  02/24/20 31.14 kg/m      Advanced Care Planning:  A voluntary discussion about advance care planning including the explanation and discussion of advance directives.   Discussed health care proxy and Living will, and the patient was able to  identify a health care proxy as mother - guardian per chart   Social History      She        Social History   Socioeconomic History  . Marital  status: Unknown    Spouse name: Not on file  . Number of children: Not on file  . Years of education: Not on file  . Highest education level: Not on file  Occupational History  . Not on file  Tobacco Use  . Smoking status: Never Smoker  . Smokeless tobacco: Never Used  Vaping Use  . Vaping Use: Never used  Substance and Sexual Activity  . Alcohol use: No  . Drug use: No  . Sexual activity: Never  Other Topics Concern  . Not on file  Social History Narrative   Patient is in a care home [name] and per Caregiver they are in quarantine    Social Determinants of Health   Financial Resource Strain:   . Difficulty of Paying Living Expenses: Not on file  Food Insecurity:   . Worried About Charity fundraiser in the Last Year: Not on file  . Ran Out of Food in the Last Year: Not on file  Transportation Needs:   . Lack of Transportation (Medical): Not on file  . Lack of Transportation (Non-Medical): Not on file  Physical Activity:   . Days of Exercise per Week: Not on file  . Minutes of Exercise per Session: Not on file  Stress:   . Feeling of Stress : Not on file  Social Connections:   . Frequency of Communication with Friends and Family: Not on file  . Frequency of Social Gatherings with Friends and Family: Not on file  . Attends Religious Services: Not on file  . Active Member of Clubs or Organizations: Not on file  . Attends Archivist Meetings: Not on file  . Marital Status: Not on file    Family History        Family History  Problem Relation Age of Onset  . Diabetes Mother   . Stroke Mother   . Cancer Father        brain  . Breast cancer Maternal Aunt 70  . Cancer Brother     Patient Active Problem List   Diagnosis Date Noted  . Swelling of limb 02/17/2020  . Dyslipidemia 10/10/2018  . Dental  caries 10/10/2018  . Overweight (BMI 25.0-29.9) 02/01/2018  . Colon cancer screening 08/29/2017  . DNR (do not resuscitate) 08/29/2017  . Hypertension   . Osteoporosis   . Intellectual disability   . Kyphosis of thoracic region     Past Surgical History:  Procedure Laterality Date  . ABDOMINAL HYSTERECTOMY     completed  . COLONOSCOPY WITH PROPOFOL N/A 11/24/2019   Procedure: COLONOSCOPY WITH PROPOFOL;  Surgeon: Jonathon Bellows, MD;  [facility] ENDOSCOPY;  Service: Gastroenterology;  Laterality: N/A;     Current Outpatient Medications:  .  acetaminophen (TYLENOL) 500 MG tablet, Take 1 tablet (500 mg total) by mouth every 6 (six) hours as needed., Disp: 30 tablet, Rfl: 0 .  Calcium Carbonate-Vitamin D 600-400 MG-UNIT tablet, Take 1 tablet by mouth 2 (two) times daily., Disp: 60 tablet, Rfl: 11 .  Cranberry-Vitamin C-Vitamin E 4200-20-3 MG-MG-UNIT CAPS, Take 1 tablet by mouth daily., Disp: 30 capsule, Rfl: 11 .  hydrOXYzine (VISTARIL) 25 MG capsule, Take 1 capsule (25 mg total) by mouth every 8 (eight) hours as needed for anxiety (anxiety or aggitation, can use before going to dentist)., Disp: 30 capsule, Rfl: 0 .  lisinopril-hydrochlorothiazide (ZESTORETIC) 10-12.5 MG tablet, Take 0.5 tablets by mouth daily., Disp: 30 tablet, Rfl: 11 .  loratadine (CLARITIN) 10 MG tablet, Take  1 tablet (10 mg total) by mouth daily as needed for allergies., Disp: 90 tablet, Rfl: 3 .  Multiple Vitamins-Minerals (MULTIVITAMIN WITH MINERALS) tablet, Take 1 tablet by mouth daily., Disp: 30 tablet, Rfl: 11 .  vitamin B-12 (CYANOCOBALAMIN) 100 MCG tablet, TAKE 1 TABLET BY MOUTH ONCE DAILY FOR SUPPLEMENT, Disp: 30 tablet, Rfl: 11 .  pantoprazole (PROTONIX) 20 MG tablet, Take 1 tablet (20 mg total) by mouth every morning for 28 days. One hour before breakfast, Disp: 28 tablet, Rfl: 0 .  pravastatin (PRAVACHOL) 20 MG tablet, Take 1 tablet (20 mg total) by mouth daily. (Patient not taking: Reported on 07/07/2020),  Disp: 31 tablet, Rfl: 11 .  triamcinolone cream (KENALOG) 0.1 %, Apply 1 application topically 2 (two) times daily. Behind the right ear as needed (Patient not taking: Reported on 07/07/2020), Disp: 30 g, Rfl: 0  No Known Allergies  Patient Care Team: Delsa Grana, PA-C as PCP - General (Family Medicine) Clyde Canterbury, MD as Referring Physician (Otolaryngology)  Review of Systems  Constitutional: Negative.   HENT: Negative.   Eyes: Negative.   Respiratory: Negative.   Cardiovascular: Negative.   Gastrointestinal: Negative.   Endocrine: Negative.   Genitourinary: Negative.   Musculoskeletal: Negative.   Skin: Negative.   Allergic/Immunologic: Negative.   Neurological: Negative.   Hematological: Negative.   Psychiatric/Behavioral: Negative.   All other systems reviewed and are negative.    I personally reviewed active problem list, medication list, allergies, family history, social history, health maintenance, notes from last encounter, lab results, imaging with the patient/caregiver today.        Objective:   Vitals:  Vitals:   07/07/20 0904  BP: 120/76  Pulse: 96  Resp: 16  Temp: 98.3 F (36.8 C)  TempSrc: Oral  SpO2: 98%  Weight: 147 lb (66.7 kg)  Height: $Remove'4\' 10"'Jjpyezf$  (1.473 m)    Body mass index is 30.72 kg/m.  Physical Exam Vitals and nursing note reviewed.  Constitutional:      General: She is not in acute distress.    Appearance: Normal appearance. She is well-developed. She is obese. She is not ill-appearing, toxic-appearing or diaphoretic.     Interventions: Face mask in place.  HENT:     Head: Normocephalic and atraumatic.     Right Ear: External ear normal.     Left Ear: External ear normal.  Eyes:     General: Lids are normal. No scleral icterus.       Right eye: No discharge.        Left eye: No discharge.     Conjunctiva/sclera: Conjunctivae normal.     Pupils: Pupils are equal, round, and reactive to light.  Neck:     Trachea: Phonation  normal. No tracheal deviation.  Cardiovascular:     Rate and Rhythm: Normal rate and regular rhythm.     Pulses: Normal pulses.          Radial pulses are 2+ on the right side and 2+ on the left side.       Posterior tibial pulses are 2+ on the right side and 2+ on the left side.     Heart sounds: Normal heart sounds. No murmur heard.  No friction rub. No gallop.   Pulmonary:     Effort: Pulmonary effort is normal. No respiratory distress.     Breath sounds: Normal breath sounds. No stridor. No wheezing, rhonchi or rales.  Chest:     Chest wall: No tenderness.  Abdominal:  General: Bowel sounds are normal. There is no distension.     Palpations: Abdomen is soft.     Tenderness: There is no abdominal tenderness. There is no right CVA tenderness, left CVA tenderness or guarding.  Musculoskeletal:     Right lower leg: No edema.     Left lower leg: No edema.     Comments: Wearing compression stockings Kyphosis   Skin:    General: Skin is warm and dry.     Coloration: Skin is not jaundiced or pale.     Findings: No rash.  Neurological:     Mental Status: She is alert.     Motor: No abnormal muscle tone.     Gait: Gait normal.  Psychiatric:        Mood and Affect: Mood normal.      Fall Risk: Fall Risk  07/07/2020 04/23/2020 01/21/2020 10/24/2019 09/30/2019  Falls in the past year? 0 0 0 0 0  Number falls in past yr: 0 0 0 0 0  Injury with Fall? 0 0 0 0 0  Risk for fall due to : - - - - -  Follow up Falls evaluation completed Falls evaluation completed - Education provided;Falls evaluation completed -    Functional Status Survey: Is the patient deaf or have difficulty hearing?: No Does the patient have difficulty seeing, even when wearing glasses/contacts?: No Does the patient have difficulty concentrating, remembering, or making decisions?: Yes Does the patient have difficulty walking or climbing stairs?: No Does the patient have difficulty dressing or bathing?: Yes Does  the patient have difficulty doing errands alone such as visiting a doctor's office or shopping?: Yes   Assessment & Plan:    CPE completed today  . USPSTF grade A and B recommendations reviewed with patient; age-appropriate recommendations, preventive care, screening tests, etc discussed and encouraged; healthy living encouraged; see AVS for patient education given to patient  . Discussed importance of 150 minutes of physical activity weekly, AHA exercise recommendations given to pt in AVS/handout  . Discussed importance of healthy diet:  eating lean meats and proteins, avoiding trans fats and saturated fats, avoid simple sugars and excessive carbs in diet, eat 6 servings of fruit/vegetables daily and drink plenty of water and avoid sweet beverages.    . Recommended pt to do annual eye exam and routine dental exams/cleanings  . Depression, alcohol, fall screening completed as documented above and per flowsheets  . Reviewed Health Maintenance: Health Maintenance  Topic Date Due  . MAMMOGRAM  12/04/2020  . TETANUS/TDAP  12/14/2027  . COLONOSCOPY  11/23/2029  . INFLUENZA VACCINE  Completed  . COVID-19 Vaccine  Completed  . Hepatitis C Screening  Completed  . HIV Screening  Completed    . Immunizations: Immunization History  Administered Date(s) Administered  . Influenza,inj,Quad PF,6+ Mos 08/02/2015, 07/12/2016, 06/28/2017, 06/17/2018, 06/09/2019, 07/07/2020  . Moderna SARS-COVID-2 Vaccination 10/15/2019, 11/13/2019  . Pneumococcal Polysaccharide-23 06/23/2000, 07/24/2005  . Td 06/20/2005  . Tdap 12/13/2017  . Zoster 09/17/2013      ICD-10-CM   1. Adult general medical exam  Z00.00 DG Bone Density    COMPLETE METABOLIC PANEL WITH GFR    CBC with Differential/Platelet    VITAMIN D 25 Hydroxy (Vit-D Deficiency, Fractures)  2. Need for influenza vaccination  Z23 Flu Vaccine QUAD 6+ mos PF IM (Fluarix Quad PF)  3. Osteoporosis without current pathological fracture, unspecified  osteoporosis type  M81.0 DG Bone Density    COMPLETE METABOLIC PANEL WITH GFR  VITAMIN D 25 Hydroxy (Vit-D Deficiency, Fractures)       Delsa Grana, PA-C 07/07/20 9:50 AM  [facility]

## 2020-07-08 LAB — COMPLETE METABOLIC PANEL WITH GFR
AG Ratio: 1.6 (calc) (ref 1.0–2.5)
ALT: 12 U/L (ref 6–29)
AST: 16 U/L (ref 10–35)
Albumin: 4.3 g/dL (ref 3.6–5.1)
Alkaline phosphatase (APISO): 71 U/L (ref 37–153)
BUN: 15 mg/dL (ref 7–25)
CO2: 30 mmol/L (ref 20–32)
Calcium: 10.2 mg/dL (ref 8.6–10.4)
Chloride: 102 mmol/L (ref 98–110)
Creat: 0.97 mg/dL (ref 0.50–1.05)
GFR, Est African American: 74 mL/min/{1.73_m2} (ref >=60)
GFR, Est Non African American: 64 mL/min/{1.73_m2} (ref >=60)
Globulin: 2.7 g/dL (calc) (ref 1.9–3.7)
Glucose, Bld: 94 mg/dL (ref 65–99)
Potassium: 4.9 mmol/L (ref 3.5–5.3)
Sodium: 139 mmol/L (ref 135–146)
Total Bilirubin: 0.6 mg/dL (ref 0.2–1.2)
Total Protein: 7 g/dL (ref 6.1–8.1)

## 2020-07-08 LAB — CBC WITH DIFFERENTIAL/PLATELET
Absolute Monocytes: 543 cells/uL (ref 200–950)
Basophils Absolute: 81 cells/uL (ref 0–200)
Basophils Relative: 1 %
Eosinophils Absolute: 251 cells/uL (ref 15–500)
Eosinophils Relative: 3.1 %
HCT: 46.8 % — ABNORMAL HIGH (ref 35.0–45.0)
Hemoglobin: 15 g/dL (ref 11.7–15.5)
Lymphs Abs: 2786 cells/uL (ref 850–3900)
MCH: 28.3 pg (ref 27.0–33.0)
MCHC: 32.1 g/dL (ref 32.0–36.0)
MCV: 88.3 fL (ref 80.0–100.0)
MPV: 9.3 fL (ref 7.5–12.5)
Monocytes Relative: 6.7 %
Neutro Abs: 4439 cells/uL (ref 1500–7800)
Neutrophils Relative %: 54.8 %
Platelets: 420 10*3/uL — ABNORMAL HIGH (ref 140–400)
RBC: 5.3 10*6/uL — ABNORMAL HIGH (ref 3.80–5.10)
RDW: 13.5 % (ref 11.0–15.0)
Total Lymphocyte: 34.4 %
WBC: 8.1 10*3/uL (ref 3.8–10.8)

## 2020-07-08 LAB — VITAMIN D 25 HYDROXY (VIT D DEFICIENCY, FRACTURES): Vit D, 25-Hydroxy: 37 ng/mL (ref 30–100)

## 2020-07-22 ENCOUNTER — Ambulatory Visit
Admission: RE | Admit: 2020-07-22 | Discharge: 2020-07-22 | Disposition: A | Discharge status: Home / Self Care | Payer: Medicare Other | Source: Ambulatory Visit | Attending: Family Medicine | Admitting: Family Medicine

## 2020-07-22 ENCOUNTER — Other Ambulatory Visit: Payer: Self-pay

## 2020-07-22 DIAGNOSIS — Z780 Asymptomatic menopausal state: Secondary | ICD-10-CM | POA: Diagnosis not present

## 2020-07-22 DIAGNOSIS — Z8739 Personal history of other diseases of the musculoskeletal system and connective tissue: Secondary | ICD-10-CM | POA: Diagnosis not present

## 2020-07-22 DIAGNOSIS — M810 Age-related osteoporosis without current pathological fracture: Secondary | ICD-10-CM | POA: Diagnosis not present

## 2020-07-22 DIAGNOSIS — Z0000 Encounter for general adult medical examination without abnormal findings: Secondary | ICD-10-CM

## 2020-07-23 ENCOUNTER — Ambulatory Visit: Payer: Medicare Other | Admitting: Family Medicine

## 2020-07-23 ENCOUNTER — Encounter: Payer: Self-pay | Admitting: Family Medicine

## 2020-07-23 ENCOUNTER — Ambulatory Visit (INDEPENDENT_AMBULATORY_CARE_PROVIDER_SITE_OTHER): Payer: Medicare Other | Admitting: Emergency Medicine

## 2020-07-23 DIAGNOSIS — Z23 Encounter for immunization: Secondary | ICD-10-CM | POA: Diagnosis not present

## 2020-07-26 ENCOUNTER — Telehealth: Payer: Self-pay

## 2020-07-26 NOTE — Telephone Encounter (Signed)
Copied from [place] [phone]. Topic: General - Other >> Jul 26, 2020  2:53 PM Wynetta Emery, [place] C wrote: Reason for CRM: pt guardian called in to request a Rx for pt. Pt is experiencing some congestion.    Pharmacy: [place], [address]  Phone:  [phone] Fax:  [phone]     [phone]

## 2020-07-27 NOTE — Telephone Encounter (Signed)
Virtual appt scheduled with Dr Roxan Hockey for tomorrow

## 2020-07-28 ENCOUNTER — Telehealth (INDEPENDENT_AMBULATORY_CARE_PROVIDER_SITE_OTHER): Payer: Medicare Other | Admitting: Internal Medicine

## 2020-07-28 DIAGNOSIS — R059 Cough, unspecified: Secondary | ICD-10-CM | POA: Diagnosis not present

## 2020-07-28 DIAGNOSIS — J069 Acute upper respiratory infection, unspecified: Secondary | ICD-10-CM

## 2020-07-28 DIAGNOSIS — F79 Unspecified intellectual disabilities: Secondary | ICD-10-CM

## 2020-07-28 NOTE — Progress Notes (Signed)
Name: Tonya Lawson   MRN: [id number]    DOB: [birth year]   Date:07/28/2020       Progress Note  Subjective  Chief Complaint  Chief Complaint  Patient presents with  . Cough    I connected with  Tonya Lawson on 07/28/20 at  9:20 AM EST by telephone and verified that I am speaking with the correct person using two identifiers.  I discussed the limitations, risks, security and privacy concerns of performing an evaluation and management service by telephone and the availability of in person appointments. The patient expressed understanding and agreed to proceed. Staff also discussed with the patient that there may be a patient responsible charge related to this service. Patient Location: Home Provider Location: [facility] Additional Individuals present: none  HPI  Patient is a 59 year old female patient of Tonya Lawson Her last visit with Tonya Lawson was in October 2021 Follows up today with cough complaint via a phone visit. Caregiver Tonya Lawson with her to help Group home setting Noted the marked limitations of this being a phone visit.  Sx's started last week + cough, min production, today is better and caregiver feels the worse is over and is much better today No marked SOB No fever, feeling feverish No sore throat.  + mild sinus congestion No loss of smell, loss of taste, eating well No N/V No muscle aches No marked loose stools/diarrhea  Has used robitussin to date  Had Covid vaccine and recent booster last Tuesday  Comorbid conditions reviewed No asthma/COPD hx,  No h/o DM HD + obesity    Patient Active Problem List   Diagnosis Date Noted  . Swelling of limb 02/17/2020  . Dyslipidemia 10/10/2018  . Dental caries 10/10/2018  . Overweight (BMI 25.0-29.9) 02/01/2018  . Colon cancer screening 08/29/2017  . DNR (do not resuscitate) 08/29/2017  . Hypertension   . Osteoporosis   . Intellectual disability   . Kyphosis of thoracic region     Past Surgical  History:  Procedure Laterality Date  . ABDOMINAL HYSTERECTOMY     completed  . COLONOSCOPY WITH PROPOFOL N/A 11/24/2019   Procedure: COLONOSCOPY WITH PROPOFOL;  Surgeon: Jonathon Bellows, MD;  Location: [facility] ENDOSCOPY;  Service: Gastroenterology;  Laterality: N/A;    Family History  Problem Relation Age of Onset  . Diabetes Mother   . Stroke Mother   . Cancer Father        brain  . Breast cancer Maternal Aunt 70  . Cancer Brother     Social History   Tobacco Use  . Smoking status: Never Smoker  . Smokeless tobacco: Never Used  Substance Use Topics  . Alcohol use: No     Current Outpatient Medications:  .  acetaminophen (TYLENOL) 500 MG tablet, Take 1 tablet (500 mg total) by mouth every 6 (six) hours as needed., Disp: 30 tablet, Rfl: 0 .  Calcium Carbonate-Vitamin D 600-400 MG-UNIT tablet, Take 1 tablet by mouth 2 (two) times daily., Disp: 60 tablet, Rfl: 11 .  Cranberry-Vitamin C-Vitamin E 4200-20-3 MG-MG-UNIT CAPS, Take 1 tablet by mouth daily., Disp: 30 capsule, Rfl: 11 .  hydrOXYzine (VISTARIL) 25 MG capsule, Take 1 capsule (25 mg total) by mouth every 8 (eight) hours as needed for anxiety (anxiety or aggitation, can use before going to dentist)., Disp: 30 capsule, Rfl: 0 .  lisinopril-hydrochlorothiazide (ZESTORETIC) 10-12.5 MG tablet, Take 0.5 tablets by mouth daily., Disp: 30 tablet, Rfl: 11 .  loratadine (CLARITIN) 10 MG tablet, Take 1 tablet (  10 mg total) by mouth daily as needed for allergies., Disp: 90 tablet, Rfl: 3 .  Multiple Vitamins-Minerals (MULTIVITAMIN WITH MINERALS) tablet, Take 1 tablet by mouth daily., Disp: 30 tablet, Rfl: 11 .  pravastatin (PRAVACHOL) 20 MG tablet, Take 1 tablet (20 mg total) by mouth daily., Disp: 31 tablet, Rfl: 11 .  vitamin B-12 (CYANOCOBALAMIN) 100 MCG tablet, TAKE 1 TABLET BY MOUTH ONCE DAILY FOR SUPPLEMENT, Disp: 30 tablet, Rfl: 11  No Known Allergies  With staff assistance, above reviewed with the patient/caregiver today.  ROS: As  per HPI, otherwise no specific complaints on a limited and focused system review   Objective  Virtual encounter, vitals not obtained.  There is no height or weight on file to calculate BMI.  Physical Exam   Appears in NAD via limited conversation Breathing: No obvious respiratory distress.  Responded appropriately and caregiver noted she is not short of breath Neurological: Pt is alert   No results found for this or any previous visit (from the past 72 hour(s)).  PHQ2/9: Depression screen [facility] 2/9 07/28/2020 07/07/2020 04/23/2020 06/17/2019 04/23/2019  Decreased Interest 0 0 0 0 0  Down, Depressed, Hopeless 0 0 0 0 0  PHQ - 2 Score 0 0 0 0 0  Altered sleeping - - 0 0 0  Tired, decreased energy - - 0 0 0  Change in appetite - - 0 0 0  Feeling bad or failure about yourself  - - 0 0 0  Trouble concentrating - - 0 0 0  Moving slowly or fidgety/restless - - 0 0 0  Suicidal thoughts - - 0 0 0  PHQ-9 Score - - 0 0 0  Difficult doing work/chores - - Not difficult at all - Not difficult at all  Some recent data might be hidden   PHQ-2/9 Result reviewed  Fall Risk: Fall Risk  07/28/2020 07/07/2020 04/23/2020 01/21/2020 10/24/2019  Falls in the past year? 0 0 0 0 0  Number falls in past yr: 0 0 0 0 0  Injury with Fall? 0 0 0 0 0  Risk for fall due to : - - - - -  Follow up - Falls evaluation completed Falls evaluation completed - Education provided;Falls evaluation completed     Assessment & Plan 1. Cough 2. Viral upper respiratory tract infection Patient has noted a cough since last week, although the caregiver noted today she seems to be better presently.  She has had some increased congestion, although has had no fevers, no marked increased production to her cough, not short of breath.  Has had the Covid vaccine and a recent booster noted. Given she is in a group home, do feel getting a Covid test is indicated, and she can get one there with the nurse today.  Encouraged she is  better presently, and does not seem unlikely to be Covid, but if can get the testing done, do feel would be helpful to confirm is not Covid if that is the case. Also emphasized the importance of limiting any exposures to others to the caregiver, and she notes they do wear masks at the facility. Also continue the Robitussin product-can use it every 4-6 hours as needed for cough She also can take Tylenol products as needed if she gets any low-grade temps or achiness. Emphasized if her symptoms are again increasing, or other more concerning symptoms arising in association, the need to follow-up. Also the follow-up if the Covid test is positive.  3. Intellectual disability History  was limited, with the caregiver very helpful with providing the history and evaluation today.  I discussed the assessment and treatment plan with the patient. The patient was provided an opportunity to ask questions and all were answered. The patient agreed with the plan and demonstrated an understanding of the instructions.  Red flags and when to present for emergency care or RTC including fevers, chest pain, shortness of breath, new/worsening/un-resolving symptoms reviewed with patient at time of visit.   The patient was advised to call back or seek an in-person evaluation if the symptoms worsen or if the condition fails to improve as anticipated.  I provided 15 minutes of non-face-to-face time during this encounter that included discussing at length patient's sx/history, pertinent pmhx, medications, treatment and follow up plan. This time also included the necessary documentation, orders, and chart review.  Towanda Malkin, MD

## 2020-08-24 ENCOUNTER — Ambulatory Visit (INDEPENDENT_AMBULATORY_CARE_PROVIDER_SITE_OTHER): Payer: Medicare Other | Admitting: Vascular Surgery

## 2020-08-24 ENCOUNTER — Other Ambulatory Visit: Payer: Self-pay

## 2020-08-24 ENCOUNTER — Encounter (INDEPENDENT_AMBULATORY_CARE_PROVIDER_SITE_OTHER): Payer: Self-pay | Admitting: Vascular Surgery

## 2020-08-24 VITALS — BP 140/95 | HR 99 | Ht 59.0 in | Wt 147.0 lb

## 2020-08-24 DIAGNOSIS — I10 Essential (primary) hypertension: Secondary | ICD-10-CM

## 2020-08-24 DIAGNOSIS — I890 Lymphedema, not elsewhere classified: Secondary | ICD-10-CM | POA: Diagnosis not present

## 2020-08-24 DIAGNOSIS — E785 Hyperlipidemia, unspecified: Secondary | ICD-10-CM | POA: Diagnosis not present

## 2020-08-24 DIAGNOSIS — M7989 Other specified soft tissue disorders: Secondary | ICD-10-CM | POA: Diagnosis not present

## 2020-08-24 NOTE — Assessment & Plan Note (Signed)
At this point, the patient appears to have stage II lymphedema from chronic scarring and lymphatic channels.  She has swelling refractory to compression and elevation.  She has had many years of swelling in her lymphatic channels are deranged from many years of swelling and inflammation.  At this point, the addition of a lymphedema pump would be an excellent adjuvant therapy to try to improve her symptoms.  This would be done in conjunction with compression and elevation continuing.  I have discussed the pathophysiology and natural history of lymphedema with her caregiver today.  They voiced their understanding.  We will plan on seeing her back in about 6 months.

## 2020-08-24 NOTE — Patient Instructions (Signed)

## 2020-08-24 NOTE — Assessment & Plan Note (Signed)
Slightly improved with compression but not much.  Previous venous reflux study was negative.

## 2020-08-24 NOTE — Progress Notes (Signed)
MRN : [id number]  Tonya Lawson is a 59 y.o. (05-Nov-1960) female who presents with chief complaint of  Chief Complaint  Patient presents with   Follow-up  .  History of Present Illness: Patient returns today in follow up of her leg swelling.  The history is obtained from her caregiver as the patient is basically nonverbal from her cognitive issues.  She has not been walking as much as the weather has cooled.  She has been wearing her compression stockings daily put on by her caregiver.  They are on today.  Despite the daily use of compression stockings, her swelling persists.  It may be slightly better than it was 6 months ago, but not tremendously.  Her venous study was negative at her last visit.  No new ulceration or infection.  No fever or chills.  Current Outpatient Medications  Medication Sig Dispense Refill   acetaminophen (TYLENOL) 500 MG tablet Take 1 tablet (500 mg total) by mouth every 6 (six) hours as needed. 30 tablet 0   Calcium Carbonate-Vitamin D 600-400 MG-UNIT tablet Take 1 tablet by mouth 2 (two) times daily. 60 tablet 11   Cranberry-Vitamin C-Vitamin E 4200-20-3 MG-MG-UNIT CAPS Take 1 tablet by mouth daily. 30 capsule 11   hydrOXYzine (VISTARIL) 25 MG capsule Take 1 capsule (25 mg total) by mouth every 8 (eight) hours as needed for anxiety (anxiety or aggitation, can use before going to dentist). 30 capsule 0   lisinopril-hydrochlorothiazide (ZESTORETIC) 10-12.5 MG tablet Take 0.5 tablets by mouth daily. 30 tablet 11   loratadine (CLARITIN) 10 MG tablet Take 1 tablet (10 mg total) by mouth daily as needed for allergies. 90 tablet 3   Multiple Vitamins-Minerals (MULTIVITAMIN WITH MINERALS) tablet Take 1 tablet by mouth daily. 30 tablet 11   pravastatin (PRAVACHOL) 20 MG tablet Take 1 tablet (20 mg total) by mouth daily. 31 tablet 11   vitamin B-12 (CYANOCOBALAMIN) 100 MCG tablet TAKE 1 TABLET BY MOUTH ONCE DAILY FOR SUPPLEMENT 30 tablet 11   No current  facility-administered medications for this visit.    Past Medical History:  Diagnosis Date   DNR (do not resuscitate) 08/29/2017   Hypercholesterolemia    Hypertension    Kyphosis of thoracic region    Mental retardation    Osteoporosis     Past Surgical History:  Procedure Laterality Date   ABDOMINAL HYSTERECTOMY     completed   COLONOSCOPY WITH PROPOFOL N/A 11/24/2019   Procedure: COLONOSCOPY WITH PROPOFOL;  Surgeon: Jonathon Bellows, MD;  Location: [facility] ENDOSCOPY;  Service: Gastroenterology;  Laterality: N/A;     Social History   Tobacco Use   Smoking status: Never Smoker   Smokeless tobacco: Never Used  Vaping Use   Vaping Use: Never used  Substance Use Topics   Alcohol use: No   Drug use: No      Family History  Problem Relation Age of Onset   Diabetes Mother    Stroke Mother    Cancer Father        brain   Breast cancer Maternal Aunt 66   Cancer Brother     No Known Allergies   REVIEW OF SYSTEMS (Negative unless checked)  Constitutional: [] ?Weight loss  [] ?Fever  [] ?Chills Cardiac: [] ?Chest pain   [] ?Chest pressure   [] ?Palpitations   [] ?Shortness of breath when laying flat   [] ?Shortness of breath at rest   [] ?Shortness of breath with exertion. Vascular:  [] ?Pain in legs with walking   [] ?Pain in legs  at rest   [] ?Pain in legs when laying flat   [] ?Claudication   [] ?Pain in feet when walking  [] ?Pain in feet at rest  [] ?Pain in feet when laying flat   [] ?History of DVT   [] ?Phlebitis   [x] ?Swelling in legs   [] ?Varicose veins   [] ?Non-healing ulcers Pulmonary:   [] ?Uses home oxygen   [] ?Productive cough   [] ?Hemoptysis   [] ?Wheeze  [] ?COPD   [] ?Asthma Neurologic:  [] ?Dizziness  [] ?Blackouts   [] ?Seizures   [] ?History of stroke   [] ?History of TIA  [] ?Aphasia   [] ?Temporary blindness   [] ?Dysphagia   [] ?Weakness or numbness in arms   [] ?Weakness or numbness in legs  X cognitive issues Musculoskeletal:  [] ?Arthritis   [] ?Joint swelling    [] ?Joint pain   [] ?Low back pain Hematologic:  [] ?Easy bruising  [] ?Easy bleeding   [] ?Hypercoagulable state   [] ?Anemic  [] ?Hepatitis Gastrointestinal:  [] ?Blood in stool   [] ?Vomiting blood  [] ?Gastroesophageal reflux/heartburn   [] ?Abdominal pain Genitourinary:  [] ?Chronic kidney disease   [] ?Difficult urination  [] ?Frequent urination  [] ?Burning with urination   [] ?Hematuria Skin:  [] ?Rashes   [] ?Ulcers   [] ?Wounds Psychological:  [] ?History of anxiety   [] ? History of major depression.   Physical Examination  BP (!) 140/95    Pulse 99    Ht 4\' 11"  (1.499 m)    Wt 147 lb (66.7 kg)    BMI 29.69 kg/m  Gen:  WD/WN, NAD Head: [place]/AT, No temporalis wasting. Ear/Nose/Throat: Hearing grossly intact, nares w/o erythema or drainage Eyes: Conjunctiva clear. Sclera non-icteric Neck: Supple.  Trachea midline Pulmonary:  Good air movement, no use of accessory muscles.  Cardiac: RRR, no JVD Vascular:  Vessel Right Left  Radial Palpable Palpable           Musculoskeletal: M/S 5/5 throughout.  No deformity or atrophy.  1-2+ right lower extremity edema, 2+ left lower extremity edema. Neurologic: Sensation grossly intact in extremities.  Symmetrical.   Psychiatric: Judgment and insight are poor. Dermatologic: No rashes or ulcers noted.  No cellulitis or open wounds.       Labs Recent Results (from the past 2160 hour(s))  COMPLETE METABOLIC PANEL WITH GFR     Status: None   Collection Time: 07/07/20 10:13 AM  Result Value Ref Range   Glucose, Bld 94 65 - 99 mg/dL    Comment: .            Fasting reference interval .    BUN 15 7 - 25 mg/dL   Creat 0.97 0.50 - 1.05 mg/dL    Comment: For patients >23 years of age, the reference limit for Creatinine is approximately 13% higher for people identified as African-American. .    GFR, Est Non African American 64 > OR = 60 mL/min/1.51m2   GFR, Est African American 74 > OR = 60 mL/min/1.30m2   BUN/Creatinine Ratio NOT APPLICABLE 6 - 22  (calc)   Sodium 139 135 - 146 mmol/L   Potassium 4.9 3.5 - 5.3 mmol/L   Chloride 102 98 - 110 mmol/L   CO2 30 20 - 32 mmol/L   Calcium 10.2 8.6 - 10.4 mg/dL   Total Protein 7.0 6.1 - 8.1 g/dL   Albumin 4.3 3.6 - 5.1 g/dL   Globulin 2.7 1.9 - 3.7 g/dL (calc)   AG Ratio 1.6 1.0 - 2.5 (calc)   Total Bilirubin 0.6 0.2 - 1.2 mg/dL   Alkaline phosphatase (APISO) 71 37 - 153 U/L   AST 16 10 -  35 U/L   ALT 12 6 - 29 U/L  CBC with Differential/Platelet     Status: Abnormal   Collection Time: 07/07/20 10:13 AM  Result Value Ref Range   WBC 8.1 3.8 - 10.8 Thousand/uL   RBC 5.30 (H) 3.80 - 5.10 Million/uL   Hemoglobin 15.0 11.7 - 15.5 g/dL   HCT 46.8 (H) 35.0 - 45.0 %   MCV 88.3 80.0 - 100.0 fL   MCH 28.3 27.0 - 33.0 pg   MCHC 32.1 32.0 - 36.0 g/dL   RDW 13.5 11.0 - 15.0 %   Platelets 420 (H) 140 - 400 Thousand/uL   MPV 9.3 7.5 - 12.5 fL   Neutro Abs 4,439 1,500 - 7,800 cells/uL   Lymphs Abs 2,786 850 - 3,900 cells/uL   Absolute Monocytes 543 200 - 950 cells/uL   Eosinophils Absolute 251 15 - 500 cells/uL   Basophils Absolute 81 0 - 200 cells/uL   Neutrophils Relative % 54.8 %   Total Lymphocyte 34.4 %   Monocytes Relative 6.7 %   Eosinophils Relative 3.1 %   Basophils Relative 1.0 %  VITAMIN D 25 Hydroxy (Vit-D Deficiency, Fractures)     Status: None   Collection Time: 07/07/20 10:13 AM  Result Value Ref Range   Vit D, 25-Hydroxy 37 30 - 100 ng/mL    Comment: Vitamin D Status         25-OH Vitamin D: . Deficiency:                    <20 ng/mL Insufficiency:             20 - 29 ng/mL Optimal:                 > or = 30 ng/mL . For 25-OH Vitamin D testing on patients on  D2-supplementation and patients for whom quantitation  of D2 and D3 fractions is required, the [employer](TM) 25-OH VIT D, (D2,D3), LC/MS/MS is recommended: order  code [id number] (patients >31yrs). See Note 1 . Note 1 . For additional information, please refer to   http://education.[facility].com/faq/FAQ199  (This link is being provided for informational/ educational purposes only.)     Radiology No results found.  Assessment/Plan Hypertension blood pressure control important in reducing the progression of atherosclerotic disease. On appropriate oral medications.   Dyslipidemia lipid control important in reducing the progression of atherosclerotic disease. Continue statin therapy  Lymphedema At this point, the patient appears to have stage II lymphedema from chronic scarring and lymphatic channels.  She has swelling refractory to compression and elevation.  She has had many years of swelling in her lymphatic channels are deranged from many years of swelling and inflammation.  At this point, the addition of a lymphedema pump would be an excellent adjuvant therapy to try to improve her symptoms.  This would be done in conjunction with compression and elevation continuing.  I have discussed the pathophysiology and natural history of lymphedema with her caregiver today.  They voiced their understanding.  We will plan on seeing her back in about 6 months.  Swelling of limb Slightly improved with compression but not much.  Previous venous reflux study was negative.    Leotis Pain, MD  08/24/2020 11:54 AM    This note was created with Dragon medical transcription system.  Any errors from dictation are purely unintentional

## 2020-09-23 ENCOUNTER — Telehealth: Payer: Self-pay

## 2020-09-23 NOTE — Telephone Encounter (Signed)
Please check on this order. We do not have

## 2020-09-23 NOTE — Telephone Encounter (Signed)
Copied from [place] [id number]. Topic: General - Inquiry >> Sep 23, 2020 12:29 PM Greggory Keen D wrote: Reason for CRM: Hoyt Koch with [name] called saying she had dropped off 90 day orders for Tonya Lawson to sign and she wants to know when she can come by and pick them up.  CB#  [phone]

## 2020-09-24 NOTE — Telephone Encounter (Signed)
Spoke with Melissa and she will refax the paperwork

## 2020-10-25 ENCOUNTER — Telehealth: Payer: Self-pay | Admitting: Family Medicine

## 2020-10-25 NOTE — Telephone Encounter (Signed)
Copied from [place] [id number]. Topic: Medicare AWV >> Oct 25, 2020  9:44 AM Cher Nakai R wrote: Reason for CRM:   Left message for patient to call back and schedule Medicare Annual Wellness Visit (AWV) in office.   If unable to come into the office for AWV,  please offer to do virtually or by telephone.  Last AWV:  10/24/2019  Please schedule at anytime with [place].  40 minute appointment  Any questions, please contact me at [phone]

## 2020-11-01 ENCOUNTER — Other Ambulatory Visit: Payer: Self-pay | Admitting: Family Medicine

## 2020-11-01 DIAGNOSIS — Z1231 Encounter for screening mammogram for malignant neoplasm of breast: Secondary | ICD-10-CM

## 2020-11-17 ENCOUNTER — Telehealth: Payer: Self-pay | Admitting: Family Medicine

## 2020-11-17 NOTE — Telephone Encounter (Signed)
Copied from [place] [id number]. Topic: Medicare AWV >> Nov 17, 2020  1:51 PM Cher Nakai R wrote: Reason for CRM:   Left message for patient to call back and schedule Medicare Annual Wellness Visit (AWV) in office.   If unable to come into the office for AWV,  please offer to do virtually or by telephone.  Last AWV: 10/24/2019  Please schedule at anytime with [place].  40 minute appointment  Any questions, please contact me at [phone]

## 2020-12-07 ENCOUNTER — Other Ambulatory Visit: Payer: Self-pay

## 2020-12-07 ENCOUNTER — Ambulatory Visit
Admission: RE | Admit: 2020-12-07 | Discharge: 2020-12-07 | Disposition: A | Discharge status: Home / Self Care | Payer: Medicare Other | Source: Ambulatory Visit | Attending: Family Medicine | Admitting: Family Medicine

## 2020-12-07 DIAGNOSIS — Z1231 Encounter for screening mammogram for malignant neoplasm of breast: Secondary | ICD-10-CM

## 2020-12-15 ENCOUNTER — Other Ambulatory Visit: Payer: Self-pay | Admitting: Family Medicine

## 2020-12-15 DIAGNOSIS — I10 Essential (primary) hypertension: Secondary | ICD-10-CM

## 2020-12-23 ENCOUNTER — Ambulatory Visit (INDEPENDENT_AMBULATORY_CARE_PROVIDER_SITE_OTHER): Payer: Medicare Other

## 2020-12-23 ENCOUNTER — Other Ambulatory Visit: Payer: Self-pay

## 2020-12-23 VITALS — BP 108/76 | HR 99 | Temp 98.7°F | Resp 16 | Ht 59.0 in | Wt 143.9 lb

## 2020-12-23 DIAGNOSIS — Z0000 Encounter for general adult medical examination without abnormal findings: Secondary | ICD-10-CM | POA: Diagnosis not present

## 2020-12-23 NOTE — Progress Notes (Signed)
Subjective:   Tonya Lawson is a 60 y.o. female who presents for Medicare Annual (Subsequent) preventive examination.  Review of Systems     Cardiac Risk Factors include: dyslipidemia;hypertension     Objective:    Today's Vitals   12/23/20 0849  BP: 108/76  Pulse: 99  Resp: 16  Temp: 98.7 F (37.1 C)  TempSrc: Oral  SpO2: 98%  Weight: 143 lb 14.4 oz (65.3 kg)  Height: 4\' 11"  (1.499 m)   Body mass index is 29.06 kg/m.  Advanced Directives 11/24/2019 10/24/2019 10/10/2018 04/09/2017 10/09/2016 04/07/2016 12/23/2015  Does Patient Have a Medical Advance Directive? Yes Unable to assess, patient is non-responsive or altered mental status;Yes Yes No No No Yes  Type of Advance Directive Out of facility DNR (pink MOST or yellow form) [place];Out of facility DNR (pink MOST or yellow form);Living will Out of facility DNR (pink MOST or yellow form) - - - Living will  Copy of Healthcare Power of Attorney in Chart? - - - - - - No - copy requested  Would patient like information on creating a medical advance directive? - - - - - No - patient declined information -  Pre-existing out of facility DNR order (yellow form or pink MOST form) - - Pink MOST form placed in chart (order not valid for inpatient use) - - - -    Current Medications (verified) Outpatient Encounter Medications as of 12/23/2020  Medication Sig  . acetaminophen (TYLENOL) 500 MG tablet Take 1 tablet (500 mg total) by mouth every 6 (six) hours as needed.  . Calcium Carbonate-Vitamin D 600-400 MG-UNIT tablet Take 1 tablet by mouth 2 (two) times daily.  . Cranberry-Vitamin C-Vitamin E 4200-20-3 MG-MG-UNIT CAPS Take 1 tablet by mouth daily.  . hydrOXYzine (VISTARIL) 25 MG capsule Take 1 capsule (25 mg total) by mouth every 8 (eight) hours as needed for anxiety (anxiety or aggitation, can use before going to dentist).  Marland Kitchen lisinopril-hydrochlorothiazide (ZESTORETIC) 10-12.5 MG tablet TAKE 1/2 TABLET BY MOUTH  DAILY  . loratadine (CLARITIN) 10 MG tablet Take 1 tablet (10 mg total) by mouth daily as needed for allergies.  . Multiple Vitamins-Minerals (MULTIVITAMIN WITH MINERALS) tablet Take 1 tablet by mouth daily.  . pravastatin (PRAVACHOL) 20 MG tablet Take 1 tablet (20 mg total) by mouth daily.  . vitamin B-12 (CYANOCOBALAMIN) 100 MCG tablet TAKE 1 TABLET BY MOUTH ONCE DAILY FOR SUPPLEMENT   No facility-administered encounter medications on file as of 12/23/2020.    Allergies (verified) Patient has no known allergies.   History: Past Medical History:  Diagnosis Date  . DNR (do not resuscitate) 08/29/2017  . Hypercholesterolemia   . Hypertension   . Kyphosis of thoracic region   . Mental retardation   . Osteoporosis    Past Surgical History:  Procedure Laterality Date  . ABDOMINAL HYSTERECTOMY     completed  . COLONOSCOPY WITH PROPOFOL N/A 11/24/2019   Procedure: COLONOSCOPY WITH PROPOFOL;  Surgeon: Jonathon Bellows, MD;  Location: [facility] ENDOSCOPY;  Service: Gastroenterology;  Laterality: N/A;   Family History  Problem Relation Age of Onset  . Diabetes Mother   . Stroke Mother   . Cancer Father        brain  . Breast cancer Maternal Aunt 70  . Cancer Brother    Social History   Socioeconomic History  . Marital status: Unknown    Spouse name: Not on file  . Number of children: Not on file  . Years  of education: Not on file  . Highest education level: Not on file  Occupational History  . Not on file  Tobacco Use  . Smoking status: Never Smoker  . Smokeless tobacco: Never Used  Vaping Use  . Vaping Use: Never used  Substance and Sexual Activity  . Alcohol use: No  . Drug use: No  . Sexual activity: Never  Other Topics Concern  . Not on file  Social History Narrative   Patient is in a care home with [name].    Social Determinants of Health   Financial Resource Strain: Low Risk   . Difficulty of Paying Living Expenses: Not hard at all  Food Insecurity: No Food  Insecurity  . Worried About Charity fundraiser in the Last Year: Never true  . Ran Out of Food in the Last Year: Never true  Transportation Needs: No Transportation Needs  . Lack of Transportation (Medical): No  . Lack of Transportation (Non-Medical): No  Physical Activity: Inactive  . Days of Exercise per Week: 0 days  . Minutes of Exercise per Session: 0 min  Stress: No Stress Concern Present  . Feeling of Stress : Not at all  Social Connections: Socially Isolated  . Frequency of Communication with Friends and Family: More than three times a week  . Frequency of Social Gatherings with Friends and Family: More than three times a week  . Attends Religious Services: Never  . Active Member of Clubs or Organizations: No  . Attends Archivist Meetings: Never  . Marital Status: Never married    Tobacco Counseling Counseling given: Not Answered   Clinical Intake:  Pre-visit preparation completed: Yes  Pain : No/denies pain     BMI - recorded: 29.06 Nutritional Status: BMI 25 -29 Overweight Nutritional Risks: Nausea/ vomitting/ diarrhea (occasional diarrhea) Diabetes: No  How often do you need to have someone help you when you read instructions, pamphlets, or other written materials from your doctor or pharmacy?: 4 - Often   Interpreter Needed?: No  Information entered by :: Clemetine Marker LPN   Activities of Daily Living In your present state of health, do you have any difficulty performing the following activities: 12/23/2020 07/28/2020  Hearing? N N  Comment declines hearing aids -  Vision? N N  Difficulty concentrating or making decisions? N Y  Walking or climbing stairs? N N  Dressing or bathing? Y Y  Doing errands, shopping? [name]  Preparing Food and eating ? N -  Using the Toilet? N -  In the past six months, have you accidently leaked urine? N -  Do you have problems with loss of bowel control? N -  Managing your Medications? Y -  Managing your  Finances? Y -  Housekeeping or managing your Housekeeping? Y -  Some recent data might be hidden    Patient Care Team: Delsa Grana, PA-C as PCP - General (Family Medicine) Clyde Canterbury, MD as Referring Physician (Otolaryngology)  Indicate any recent Medical Services you may have received from other than [facility] providers in the past year (date may be approximate).     Assessment:   This is a routine wellness examination for [place].  Hearing/Vision screen  Hearing Screening   125Hz  250Hz  500Hz  1000Hz  2000Hz  3000Hz  4000Hz  6000Hz  8000Hz   Right ear:           Left ear:           Comments: Pt denies hearing difficulty  Vision Screening Comments: Vision  screenings done by Dr. Matilde Sprang every 2 years  Dietary issues and exercise activities discussed: Current Exercise Habits: The patient does not participate in regular exercise at present, Exercise limited by: None identified  Goals    . Increase physical activity     Recommend increasing physical activity to at least 150 minutes per week       Depression Screen PHQ 2/9 Scores 12/23/2020 07/28/2020 07/07/2020 04/23/2020 01/21/2020 10/24/2019 09/30/2019  PHQ - 2 Score 1 0 0 0 - - -  PHQ- 9 Score - - - 0 - - -  Exception Documentation - - - - Medical reason Other- indicate reason in comment box Medical reason    Fall Risk Fall Risk  12/23/2020 07/28/2020 07/07/2020 04/23/2020 01/21/2020  Falls in the past year? 0 0 0 0 0  Number falls in past yr: 0 0 0 0 0  Injury with Fall? 0 0 0 0 0  Risk for fall due to : No Fall Risks - - - -  Follow up Falls prevention discussed - Falls evaluation completed Falls evaluation completed -    FALL RISK PREVENTION PERTAINING TO THE HOME:  Any stairs in or around the home? No  If so, are there any without handrails? No  Home free of loose throw rugs in walkways, pet beds, electrical cords, etc? Yes  Adequate lighting in your home to reduce risk of falls? Yes   ASSISTIVE DEVICES UTILIZED TO PREVENT  FALLS:  Life alert? No  Use of a cane, walker or w/c? No  Grab bars in the bathroom? Yes  Shower chair or bench in shower? Yes  Elevated toilet seat or a handicapped toilet? Yes   TIMED UP AND GO:  Was the test performed? Yes .  Length of time to ambulate 10 feet: 5 sec.   Gait steady and fast without use of assistive device  Cognitive Function:Cognitive status assessed by direct observation. Patient has current diagnosis of cognitive impairment. Patient is unable to complete screening 6CIT or MMSE.       6CIT Screen 08/29/2017  What Year? 4 points  What month? 3 points  What time? 3 points  Count back from 20 4 points  Months in reverse 4 points  Repeat phrase 10 points  Total Score 28    Immunizations Immunization History  Administered Date(s) Administered  . Influenza,inj,Quad PF,6+ Mos 08/02/2015, 07/12/2016, 06/28/2017, 06/17/2018, 06/09/2019, 07/07/2020  . Moderna Sars-Covid-2 Vaccination 10/15/2019, 11/13/2019  . Pneumococcal Polysaccharide-23 06/23/2000, 07/24/2005  . Td 06/20/2005  . Tdap 12/13/2017  . Zoster 09/17/2013  . Zoster Recombinat (Shingrix) 07/23/2020    TDAP status: Up to date  Flu Vaccine status: Up to date  Pneumococcal vaccine status: Up to date  Covid-19 vaccine status: Completed vaccines - advised need record for booster vaccine  Qualifies for Shingles Vaccine? Yes   Zostavax completed Yes   Shingrix Completed?: Yes due for second dose  Screening Tests Health Maintenance  Topic Date Due  . COVID-19 Vaccine (3 - Booster for Moderna series) 05/15/2020  . INFLUENZA VACCINE  04/11/2021  . MAMMOGRAM  12/07/2021  . DEXA SCAN  07/22/2022  . TETANUS/TDAP  12/14/2027  . COLONOSCOPY (Pts 45-71yrs Insurance coverage will need to be confirmed)  11/23/2029  . Hepatitis C Screening  Completed  . HIV Screening  Completed  . HPV VACCINES  Aged Out    Health Maintenance  Health Maintenance Due  Topic Date Due  . COVID-19 Vaccine (3 -  Booster for Moderna series) 05/15/2020  Colorectal cancer screening: Type of screening: Colonoscopy. Completed 11/24/19. Repeat every 10 years  Mammogram status: Completed 12/07/20. Repeat every year  Bone Density status: Completed 07/22/20. Results reflect: Bone density results: OSTEOPOROSIS. Repeat every 2 years.  Lung Cancer Screening: (Low Dose CT Chest recommended if Age 69-80 years, 30 pack-year currently smoking OR have quit w/in 15years.) does not qualify.   Additional Screening:  Hepatitis C Screening: does qualify; Completed 08/29/17  Vision Screening: Recommended annual ophthalmology exams for early detection of glaucoma and other disorders of the eye. Is the patient up to date with their annual eye exam?  Yes  Who is the provider or what is the name of the office in which the patient attends annual eye exams? Dr. Matilde Sprang  Dental Screening: Recommended annual dental exams for proper oral hygiene  Community Resource Referral / Chronic Care Management: CRR required this visit?  No   CCM required this visit?  No      Plan:     I have personally reviewed and noted the following in the patient's chart:   . Medical and social history . Use of alcohol, tobacco or illicit drugs  . Current medications and supplements . Functional ability and status . Nutritional status . Physical activity . Advanced directives . List of other physicians . Hospitalizations, surgeries, and ER visits in previous 12 months . Vitals . Screenings to include cognitive, depression, and falls . Referrals and appointments  In addition, I have reviewed and discussed with patient certain preventive protocols, quality metrics, and best practice recommendations. A written personalized care plan for preventive services as well as general preventive health recommendations were provided to patient.     Clemetine Marker, LPN   01/17/3266   Nurse Notes: pt accompanied to visit today by Guss Bunde with [facility]. Pt is worried about her sister who is at [facility] s/p fall and surgery.

## 2020-12-23 NOTE — Patient Instructions (Signed)
[name] , Thank you for taking time to come for your Medicare Wellness Visit. I appreciate your ongoing commitment to your health goals. Please review the following plan we discussed and let me know if I can assist you in the future.   Screening recommendations/referrals: Colonoscopy: done 11/24/19. Repeat in 2031 Mammogram: done 12/07/20 Bone Density: done 07/22/20 Recommended yearly ophthalmology/optometry visit for glaucoma screening and checkup Recommended yearly dental visit for hygiene and checkup  Vaccinations: Influenza vaccine: done 07/07/20 Pneumococcal vaccine: due age 61 Tdap vaccine: done 12/13/17 Shingles vaccine: 1st dose 07/23/20; due for second dose  Covid-19: done 10/15/19 & 11/13/19; please bring a copy of vaccine record to next appt for booster information   Conditions/risks identified: Recommend increasing physical activity   Next appointment: Follow up in one year for your annual wellness visit.   Preventive Care 40-64 Years, Female Preventive care refers to lifestyle choices and visits with your health care provider that can promote health and wellness. What does preventive care include?  A yearly physical exam. This is also called an annual well check.  Dental exams once or twice a year.  Routine eye exams. Ask your health care provider how often you should have your eyes checked.  Personal lifestyle choices, including:  Daily care of your teeth and gums.  Regular physical activity.  Eating a healthy diet.  Avoiding tobacco and drug use.  Limiting alcohol use.  Practicing safe sex.  Taking low-dose aspirin daily starting at age 42.  Taking vitamin and mineral supplements as recommended by your health care provider. What happens during an annual well check? The services and screenings done by your health care provider during your annual well check will depend on your age, overall health, lifestyle risk factors, and family history of  disease. Counseling  Your health care provider may ask you questions about your:  Alcohol use.  Tobacco use.  Drug use.  Emotional well-being.  Home and relationship well-being.  Sexual activity.  Eating habits.  Work and work Statistician.  Method of birth control.  Menstrual cycle.  Pregnancy history. Screening  You may have the following tests or measurements:  Height, weight, and BMI.  Blood pressure.  Lipid and cholesterol levels. These may be checked every 5 years, or more frequently if you are over 70 years old.  Skin check.  Lung cancer screening. You may have this screening every year starting at age 45 if you have a 30-pack-year history of smoking and currently smoke or have quit within the past 15 years.  Fecal occult blood test (FOBT) of the stool. You may have this test every year starting at age 75.  Flexible sigmoidoscopy or colonoscopy. You may have a sigmoidoscopy every 5 years or a colonoscopy every 10 years starting at age 78.  Hepatitis C blood test.  Hepatitis B blood test.  Sexually transmitted disease (STD) testing.  Diabetes screening. This is done by checking your blood sugar (glucose) after you have not eaten for a while (fasting). You may have this done every 1-3 years.  Mammogram. This may be done every 1-2 years. Talk to your health care provider about when you should start having regular mammograms. This may depend on whether you have a family history of breast cancer.  BRCA-related cancer screening. This may be done if you have a family history of breast, ovarian, tubal, or peritoneal cancers.  Pelvic exam and Pap test. This may be done every 3 years starting at age 73. Starting at age  30, this may be done every 5 years if you have a Pap test in combination with an HPV test.  Bone density scan. This is done to screen for osteoporosis. You may have this scan if you are at high risk for osteoporosis. Discuss your test results,  treatment options, and if necessary, the need for more tests with your health care provider. Vaccines  Your health care provider may recommend certain vaccines, such as:  Influenza vaccine. This is recommended every year.  Tetanus, diphtheria, and acellular pertussis (Tdap, Td) vaccine. You may need a Td booster every 10 years.  Zoster vaccine. You may need this after age 35.  Pneumococcal 13-valent conjugate (PCV13) vaccine. You may need this if you have certain conditions and were not previously vaccinated.  Pneumococcal polysaccharide (PPSV23) vaccine. You may need one or two doses if you smoke cigarettes or if you have certain conditions. Talk to your health care provider about which screenings and vaccines you need and how often you need them. This information is not intended to replace advice given to you by your health care provider. Make sure you discuss any questions you have with your health care provider. Document Released: 09/24/2015 Document Revised: 05/17/2016 Document Reviewed: 06/29/2015 Elsevier Interactive Patient Education  2017 [place] Prevention in the Home Falls can cause injuries. They can happen to people of all ages. There are many things you can do to make your home safe and to help prevent falls. What can I do on the outside of my home?  Regularly fix the edges of walkways and driveways and fix any cracks.  Remove anything that might make you trip as you walk through a door, such as a raised step or threshold.  Trim any bushes or trees on the path to your home.  Use bright outdoor lighting.  Clear any walking paths of anything that might make someone trip, such as rocks or tools.  Regularly check to see if handrails are loose or broken. Make sure that both sides of any steps have handrails.  Any raised decks and porches should have guardrails on the edges.  Have any leaves, snow, or ice cleared regularly.  Use sand or salt on walking  paths during winter.  Clean up any spills in your garage right away. This includes oil or grease spills. What can I do in the bathroom?  Use night lights.  Install grab bars by the toilet and in the tub and shower. Do not use towel bars as grab bars.  Use non-skid mats or decals in the tub or shower.  If you need to sit down in the shower, use a plastic, non-slip stool.  Keep the floor dry. Clean up any water that spills on the floor as soon as it happens.  Remove soap buildup in the tub or shower regularly.  Attach bath mats securely with double-sided non-slip rug tape.  Do not have throw rugs and other things on the floor that can make you trip. What can I do in the bedroom?  Use night lights.  Make sure that you have a light by your bed that is easy to reach.  Do not use any sheets or blankets that are too big for your bed. They should not hang down onto the floor.  Have a firm chair that has side arms. You can use this for support while you get dressed.  Do not have throw rugs and other things on the floor that can  make you trip. What can I do in the kitchen?  Clean up any spills right away.  Avoid walking on wet floors.  Keep items that you use a lot in easy-to-reach places.  If you need to reach something above you, use a strong step stool that has a grab bar.  Keep electrical cords out of the way.  Do not use floor polish or wax that makes floors slippery. If you must use wax, use non-skid floor wax.  Do not have throw rugs and other things on the floor that can make you trip. What can I do with my stairs?  Do not leave any items on the stairs.  Make sure that there are handrails on both sides of the stairs and use them. Fix handrails that are broken or loose. Make sure that handrails are as long as the stairways.  Check any carpeting to make sure that it is firmly attached to the stairs. Fix any carpet that is loose or worn.  Avoid having throw rugs at  the top or bottom of the stairs. If you do have throw rugs, attach them to the floor with carpet tape.  Make sure that you have a light switch at the top of the stairs and the bottom of the stairs. If you do not have them, ask someone to add them for you. What else can I do to help prevent falls?  Wear shoes that:  Do not have high heels.  Have rubber bottoms.  Are comfortable and fit you well.  Are closed at the toe. Do not wear sandals.  If you use a stepladder:  Make sure that it is fully opened. Do not climb a closed stepladder.  Make sure that both sides of the stepladder are locked into place.  Ask someone to hold it for you, if possible.  Clearly mark and make sure that you can see:  Any grab bars or handrails.  First and last steps.  Where the edge of each step is.  Use tools that help you move around (mobility aids) if they are needed. These include:  Canes.  Walkers.  Scooters.  Crutches.  Turn on the lights when you go into a dark area. Replace any light bulbs as soon as they burn out.  Set up your furniture so you have a clear path. Avoid moving your furniture around.  If any of your floors are uneven, fix them.  If there are any pets around you, be aware of where they are.  Review your medicines with your doctor. Some medicines can make you feel dizzy. This can increase your chance of falling. Ask your doctor what other things that you can do to help prevent falls. This information is not intended to replace advice given to you by your health care provider. Make sure you discuss any questions you have with your health care provider. Document Released: 06/24/2009 Document Revised: 02/03/2016 Document Reviewed: 10/02/2014 Elsevier Interactive Patient Education  2017 [employer].

## 2020-12-27 ENCOUNTER — Telehealth: Payer: Self-pay | Admitting: Family Medicine

## 2020-12-27 NOTE — Telephone Encounter (Signed)
Tonya Lawson is calling to see if orders have been signed by Katha Cabal Please advise Cb- [phone]

## 2020-12-27 NOTE — Telephone Encounter (Signed)
Notified, that Tonya Lawson will most likely not be in the office til wednesday

## 2021-01-06 ENCOUNTER — Other Ambulatory Visit: Payer: Self-pay

## 2021-01-06 ENCOUNTER — Ambulatory Visit (INDEPENDENT_AMBULATORY_CARE_PROVIDER_SITE_OTHER): Payer: Medicare Other | Admitting: Family Medicine

## 2021-01-06 ENCOUNTER — Encounter: Payer: Self-pay | Admitting: Family Medicine

## 2021-01-06 VITALS — BP 122/74 | HR 93 | Temp 98.2°F | Resp 16 | Ht 59.0 in | Wt 142.7 lb

## 2021-01-06 DIAGNOSIS — E663 Overweight: Secondary | ICD-10-CM

## 2021-01-06 DIAGNOSIS — J302 Other seasonal allergic rhinitis: Secondary | ICD-10-CM | POA: Diagnosis not present

## 2021-01-06 DIAGNOSIS — F79 Unspecified intellectual disabilities: Secondary | ICD-10-CM | POA: Diagnosis not present

## 2021-01-06 DIAGNOSIS — F419 Anxiety disorder, unspecified: Secondary | ICD-10-CM

## 2021-01-06 DIAGNOSIS — R609 Edema, unspecified: Secondary | ICD-10-CM | POA: Diagnosis not present

## 2021-01-06 DIAGNOSIS — I10 Essential (primary) hypertension: Secondary | ICD-10-CM | POA: Diagnosis not present

## 2021-01-06 DIAGNOSIS — D473 Essential (hemorrhagic) thrombocythemia: Secondary | ICD-10-CM | POA: Diagnosis not present

## 2021-01-06 DIAGNOSIS — R718 Other abnormality of red blood cells: Secondary | ICD-10-CM | POA: Diagnosis not present

## 2021-01-06 DIAGNOSIS — E785 Hyperlipidemia, unspecified: Secondary | ICD-10-CM | POA: Diagnosis not present

## 2021-01-06 DIAGNOSIS — D75839 Thrombocytosis, unspecified: Secondary | ICD-10-CM

## 2021-01-06 DIAGNOSIS — N1831 Chronic kidney disease, stage 3a: Secondary | ICD-10-CM

## 2021-01-06 DIAGNOSIS — M810 Age-related osteoporosis without current pathological fracture: Secondary | ICD-10-CM | POA: Diagnosis not present

## 2021-01-06 MED ORDER — CALCIUM CARBONATE-VITAMIN D 600-400 MG-UNIT PO TABS: 1.0000 | ORAL_TABLET | Freq: Two times a day (BID) | ORAL | 11 refills | Status: AC

## 2021-01-06 MED ORDER — PRAVASTATIN SODIUM 20 MG PO TABS: 20.0000 mg | ORAL_TABLET | Freq: Every day | ORAL | 11 refills | Status: DC

## 2021-01-06 MED ORDER — LORATADINE 10 MG PO TABS
10.0000 mg | ORAL_TABLET | Freq: Every day | ORAL | 3 refills | Status: DC | PRN
Start: 2021-01-06 — End: 2024-02-21

## 2021-01-06 MED ORDER — HYDROXYZINE PAMOATE 25 MG PO CAPS: 25.0000 mg | ORAL_CAPSULE | Freq: Three times a day (TID) | ORAL | 0 refills | Status: DC | PRN

## 2021-01-06 NOTE — Progress Notes (Signed)
Name: Tonya Lawson   MRN: [id number]    DOB: 1960/11/03   Date:01/06/2021       Progress Note  Chief Complaint  Patient presents with   Follow-up   Hyperlipidemia   Medication Refill     Subjective:   Tonya Lawson is a 60 y.o. female, presents to clinic for routine follow-up  Hypertension:  Currently managed on lisinopril HCTZ 10-12.5  Pt reports good med compliance and denies any SE.   Blood pressure today is well controlled. BP Readings from Last 3 Encounters:  01/06/21 122/74  12/23/20 108/76  08/24/20 (!) 140/95   Pt denies CP, SOB, exertional sx, LE edema, palpitation, Ha's, visual disturbances, lightheadedness, hypotension, syncope.    Hyperlipidemia: Current Medication Regimen:  on pravastatin 20 mg  Last Lipids: Lab Results  Component Value Date   CHOL 158 10/24/2019   HDL 61 10/24/2019   LDLCALC 71 10/24/2019   TRIG 189 (H) 10/24/2019   CHOLHDL 2.6 10/24/2019   - Denies: Chest pain, shortness of breath, myalgias, claudication     She is postmenopausal  Vit D normal with last labs and she is on supplement dx of osteoporosis - done density done - due every 2 years, supplementing with calcium and Vit D     Lives in a group home with her sister Is here with her caregiver today and her sister, caregiver reports they are very independent    HM tab reviewed and updated Health Maintenance  Topic Date Due   COVID-19 Vaccine (3 - Booster for Moderna series) 05/15/2020   INFLUENZA VACCINE  04/11/2021   MAMMOGRAM  12/07/2021   DEXA SCAN  07/22/2022   TETANUS/TDAP  12/14/2027   COLONOSCOPY (Pts 45-36yrs Insurance coverage will need to be confirmed)  11/23/2029   Hepatitis C Screening  Completed   HIV Screening  Completed   HPV VACCINES  Aged Out      IDD, hx of anxiety and depression, moods stable without SSRI meds, rarely uses hydroxyzine that has been prescribed for her in the past, mood and behavior have been better with easing COVID  restrictions even with her sister having to go to SNF recently her mood and behavior has been reported as good  Overweight, some weight loss over the past couple months Wt Readings from Last 5 Encounters:  01/06/21 142 lb 11.2 oz (64.7 kg)  12/23/20 143 lb 14.4 oz (65.3 kg)  08/24/20 147 lb (66.7 kg)  07/07/20 147 lb (66.7 kg)  04/23/20 146 lb 11.2 oz (66.5 kg)   BMI Readings from Last 5 Encounters:  01/06/21 28.82 kg/m  12/23/20 29.06 kg/m  08/24/20 29.69 kg/m  07/07/20 30.72 kg/m  04/23/20 30.66 kg/m      Current Outpatient Medications:    acetaminophen (TYLENOL) 500 MG tablet, Take 1 tablet (500 mg total) by mouth every 6 (six) hours as needed., Disp: 30 tablet, Rfl: 0   Calcium Carbonate-Vitamin D 600-400 MG-UNIT tablet, Take 1 tablet by mouth 2 (two) times daily., Disp: 60 tablet, Rfl: 11   Cranberry-Vitamin C-Vitamin E 4200-20-3 MG-MG-UNIT CAPS, Take 1 tablet by mouth daily., Disp: 30 capsule, Rfl: 11   hydrOXYzine (VISTARIL) 25 MG capsule, Take 1 capsule (25 mg total) by mouth every 8 (eight) hours as needed for anxiety (anxiety or aggitation, can use before going to dentist)., Disp: 30 capsule, Rfl: 0   lisinopril-hydrochlorothiazide (ZESTORETIC) 10-12.5 MG tablet, TAKE 1/2 TABLET BY MOUTH DAILY, Disp: 30 tablet, Rfl: 11   loratadine (CLARITIN) 10  MG tablet, Take 1 tablet (10 mg total) by mouth daily as needed for allergies., Disp: 90 tablet, Rfl: 3   Multiple Vitamins-Minerals (MULTIVITAMIN WITH MINERALS) tablet, Take 1 tablet by mouth daily., Disp: 30 tablet, Rfl: 11   pravastatin (PRAVACHOL) 20 MG tablet, Take 1 tablet (20 mg total) by mouth daily., Disp: 31 tablet, Rfl: 11   vitamin B-12 (CYANOCOBALAMIN) 100 MCG tablet, TAKE 1 TABLET BY MOUTH ONCE DAILY FOR SUPPLEMENT, Disp: 30 tablet, Rfl: 11  Patient Active Problem List   Diagnosis Date Noted   Lymphedema 08/24/2020   Swelling of limb 02/17/2020   Dyslipidemia 10/10/2018   Dental caries 10/10/2018   Overweight  (BMI 25.0-29.9) 02/01/2018   Colon cancer screening 08/29/2017   DNR (do not resuscitate) 08/29/2017   Hypertension    Osteoporosis    Intellectual disability    Kyphosis of thoracic region     Past Surgical History:  Procedure Laterality Date   ABDOMINAL HYSTERECTOMY     completed   COLONOSCOPY WITH PROPOFOL N/A 11/24/2019   Procedure: COLONOSCOPY WITH PROPOFOL;  Surgeon: Jonathon Bellows, MD;  Location: [facility] ENDOSCOPY;  Service: Gastroenterology;  Laterality: N/A;    Family History  Problem Relation Age of Onset   Diabetes Mother    Stroke Mother    Cancer Father        brain   Breast cancer Maternal Aunt 9   Cancer Brother     Social History   Tobacco Use   Smoking status: Never Smoker   Smokeless tobacco: Never Used  Vaping Use   Vaping Use: Never used  Substance Use Topics   Alcohol use: No   Drug use: No     No Known Allergies  Health Maintenance  Topic Date Due   COVID-19 Vaccine (3 - Booster for Moderna series) 05/15/2020   INFLUENZA VACCINE  04/11/2021   MAMMOGRAM  12/07/2021   DEXA SCAN  07/22/2022   TETANUS/TDAP  12/14/2027   COLONOSCOPY (Pts 45-87yrs Insurance coverage will need to be confirmed)  11/23/2029   Hepatitis C Screening  Completed   HIV Screening  Completed   HPV VACCINES  Aged Out    Chart Review Today: I personally reviewed active problem list, medication list, allergies, family history, social history, health maintenance, notes from last encounter, lab results, imaging with the patient/caregiver today.   Review of Systems  Constitutional: Negative.   HENT: Negative.    Eyes: Negative.   Respiratory: Negative.    Cardiovascular: Negative.   Gastrointestinal: Negative.   Endocrine: Negative.   Genitourinary: Negative.   Musculoskeletal: Negative.   Skin: Negative.   Allergic/Immunologic: Negative.   Neurological: Negative.   Hematological: Negative.   Psychiatric/Behavioral: Negative.    All other systems reviewed and are  negative.   Objective:   Vitals:   01/06/21 0904  BP: 122/74  Pulse: 93  Resp: 16  Temp: 98.2 F (36.8 C)  SpO2: 99%  Weight: 142 lb 11.2 oz (64.7 kg)  Height: 4\' 11"  (1.499 m)    Body mass index is 28.82 kg/m.  Physical Exam Vitals and nursing note reviewed.  Constitutional:      General: She is not in acute distress.    Appearance: Normal appearance. She is well-developed. She is not ill-appearing, toxic-appearing or diaphoretic.     Interventions: Face mask in place.  HENT:     Head: Normocephalic and atraumatic.     Right Ear: External ear normal.     Left Ear: External ear normal.  Eyes:     General: Lids are normal. No scleral icterus.       Right eye: No discharge.        Left eye: No discharge.     Conjunctiva/sclera: Conjunctivae normal.  Neck:     Trachea: Phonation normal. No tracheal deviation.  Cardiovascular:     Rate and Rhythm: Normal rate and regular rhythm.     Pulses: Normal pulses.          Radial pulses are 2+ on the right side and 2+ on the left side.       Posterior tibial pulses are 2+ on the right side and 2+ on the left side.     Heart sounds: Normal heart sounds. No murmur heard.   No friction rub. No gallop.  Pulmonary:     Effort: Pulmonary effort is normal. No respiratory distress.     Breath sounds: Normal breath sounds. No stridor. No wheezing, rhonchi or rales.  Chest:     Chest wall: No tenderness.  Abdominal:     General: Bowel sounds are normal. There is no distension.     Palpations: Abdomen is soft.  Musculoskeletal:     Right lower leg: No edema.     Left lower leg: No edema.  Skin:    General: Skin is warm and dry.     Coloration: Skin is not jaundiced or pale.     Findings: No rash.  Neurological:     Mental Status: She is alert. Mental status is at baseline.     Motor: No abnormal muscle tone.     Gait: Gait normal.  Psychiatric:        Mood and Affect: Mood normal.        Speech: Speech normal.     Comments:  Pleasant, at baseline        Assessment & Plan:     ICD-10-CM   1. Dyslipidemia  E78.5 pravastatin (PRAVACHOL) 20 MG tablet    Lipid panel    COMPLETE METABOLIC PANEL WITH GFR   compliant with statin, no myalgias, due for labs    2. Intellectual disability  F79    An adult group home, baseline mood and behavior    3. Essential hypertension  U93 COMPLETE METABOLIC PANEL WITH GFR   well controlled today, good med compliance, no SE, due for labs    4. Dependent edema  A35.5 COMPLETE METABOLIC PANEL WITH GFR   compression socks, low salt, appears well controlled today, following with vascular, reviewed proper wearing of compression stockings adjusted for patient today    5. Osteoporosis without current pathological fracture, unspecified osteoporosis type  D32.2 COMPLETE METABOLIC PANEL WITH GFR    Calcium Carbonate-Vitamin D 600-400 MG-UNIT tablet   On supplements, monitoring DEXA    6. Stage 3a chronic kidney disease (HCC)  G25.42 COMPLETE METABOLIC PANEL WITH GFR   Recheck labs, on ACE inhibitor    7. Overweight (BMI 25.0-29.9)  E66.3     8. Thrombocytosis  D75.839 CBC with Differential/Platelet   Recheck labs    9. Anxiety  F41.9 hydrOXYzine (VISTARIL) 25 MG capsule   Mood and behavior are good and at her baseline currently not using medications daily or rarely as needed    10. Elevated hematocrit  R71.8 CBC with Differential/Platelet   Recheck    11. Essential (hemorrhagic) thrombocythemia (Blue) Chronic D47.3 CBC with Differential/Platelet    12. Seasonal allergies  J30.2 loratadine (CLARITIN) 10 MG tablet   Continue  loratadine for seasonal allergies       Return in about 6 months (around 07/08/2021) for Routine follow-up.   Delsa Grana, PA-C 01/06/21 9:16 AM

## 2021-01-07 LAB — LIPID PANEL
Cholesterol: 170 mg/dL (ref <200)
HDL: 61 mg/dL (ref >=50)
LDL Cholesterol (Calc): 86 mg/dL (calc)
Non-HDL Cholesterol (Calc): 109 mg/dL (calc) (ref <130)
Total CHOL/HDL Ratio: 2.8 (calc) (ref <5.0)
Triglycerides: 131 mg/dL (ref <150)

## 2021-01-07 LAB — COMPLETE METABOLIC PANEL WITH GFR
AG Ratio: 1.6 (calc) (ref 1.0–2.5)
ALT: 13 U/L (ref 6–29)
AST: 17 U/L (ref 10–35)
Albumin: 4.4 g/dL (ref 3.6–5.1)
Alkaline phosphatase (APISO): 72 U/L (ref 37–153)
BUN: 18 mg/dL (ref 7–25)
CO2: 31 mmol/L (ref 20–32)
Calcium: 11 mg/dL — ABNORMAL HIGH (ref 8.6–10.4)
Chloride: 99 mmol/L (ref 98–110)
Creat: 0.89 mg/dL (ref 0.50–1.05)
GFR, Est African American: 82 mL/min/{1.73_m2} (ref >=60)
GFR, Est Non African American: 71 mL/min/{1.73_m2} (ref >=60)
Globulin: 2.7 g/dL (calc) (ref 1.9–3.7)
Glucose, Bld: 76 mg/dL (ref 65–99)
Potassium: 4.5 mmol/L (ref 3.5–5.3)
Sodium: 137 mmol/L (ref 135–146)
Total Bilirubin: 0.5 mg/dL (ref 0.2–1.2)
Total Protein: 7.1 g/dL (ref 6.1–8.1)

## 2021-01-07 LAB — CBC WITH DIFFERENTIAL/PLATELET
Absolute Monocytes: 614 cells/uL (ref 200–950)
Basophils Absolute: 89 cells/uL (ref 0–200)
Basophils Relative: 1 %
Eosinophils Absolute: 80 cells/uL (ref 15–500)
Eosinophils Relative: 0.9 %
HCT: 45.2 % — ABNORMAL HIGH (ref 35.0–45.0)
Hemoglobin: 14.7 g/dL (ref 11.7–15.5)
Lymphs Abs: 3560 cells/uL (ref 850–3900)
MCH: 28.3 pg (ref 27.0–33.0)
MCHC: 32.5 g/dL (ref 32.0–36.0)
MCV: 86.9 fL (ref 80.0–100.0)
MPV: 9.3 fL (ref 7.5–12.5)
Monocytes Relative: 6.9 %
Neutro Abs: 4557 cells/uL (ref 1500–7800)
Neutrophils Relative %: 51.2 %
Platelets: 472 10*3/uL — ABNORMAL HIGH (ref 140–400)
RBC: 5.2 10*6/uL — ABNORMAL HIGH (ref 3.80–5.10)
RDW: 13.4 % (ref 11.0–15.0)
Total Lymphocyte: 40 %
WBC: 8.9 10*3/uL (ref 3.8–10.8)

## 2021-01-17 ENCOUNTER — Other Ambulatory Visit: Payer: Self-pay | Admitting: Family Medicine

## 2021-01-18 ENCOUNTER — Telehealth: Payer: Self-pay

## 2021-01-18 ENCOUNTER — Telehealth: Payer: Self-pay | Admitting: Family Medicine

## 2021-01-18 NOTE — Telephone Encounter (Signed)
Paper up front

## 2021-01-18 NOTE — Telephone Encounter (Signed)
Faye from group home is calling and would like to pick up d/c to stop calcium medication

## 2021-01-18 NOTE — Telephone Encounter (Signed)
Per labs and Dr. Ancil Boozer Calcium level is high.  So we will D/C calcium supplements at this time.

## 2021-02-09 ENCOUNTER — Telehealth: Payer: Self-pay

## 2021-02-09 NOTE — Telephone Encounter (Signed)
Copied from [place] [phone]. Topic: Medical Record Request - Other >> Feb 09, 2021 12:02 PM Lennox Solders wrote: Patient Name/DOB/MRN #:  Requestor Name/Agency: [facility]  Daishia Fetterly [place] rn is requesting  progress note for DOS 07-07-2020.

## 2021-02-22 ENCOUNTER — Ambulatory Visit (INDEPENDENT_AMBULATORY_CARE_PROVIDER_SITE_OTHER): Payer: Medicare Other | Admitting: Vascular Surgery

## 2021-02-22 ENCOUNTER — Other Ambulatory Visit: Payer: Self-pay

## 2021-02-22 VITALS — BP 116/81 | HR 80 | Resp 16 | Wt 141.0 lb

## 2021-02-22 DIAGNOSIS — I10 Essential (primary) hypertension: Secondary | ICD-10-CM | POA: Diagnosis not present

## 2021-02-22 DIAGNOSIS — E785 Hyperlipidemia, unspecified: Secondary | ICD-10-CM | POA: Diagnosis not present

## 2021-02-22 DIAGNOSIS — I890 Lymphedema, not elsewhere classified: Secondary | ICD-10-CM | POA: Diagnosis not present

## 2021-02-22 NOTE — Progress Notes (Signed)
MRN : [id number]  Tonya Lawson is a 60 y.o. (09-May-1961) female who presents with chief complaint of  Chief Complaint  Patient presents with   Follow-up    6 month follow up  .  History of Present Illness: Patient returns today in follow up of her lymphedema.  She is doing reasonably well.  She is accompanied by caretaker from her facility.  She really cannot provide much history.  Her leg swelling is basically the same.  They are using lymphedema pump about every other day.  She does wear compression stockings daily.  Current Outpatient Medications  Medication Sig Dispense Refill   acetaminophen (TYLENOL) 500 MG tablet Take 1 tablet (500 mg total) by mouth every 6 (six) hours as needed. 30 tablet 0   Calcium Carbonate-Vitamin D 600-400 MG-UNIT tablet Take 1 tablet by mouth 2 (two) times daily. 60 tablet 11   Cranberry-Vitamin C-Vitamin E 4200-20-3 MG-MG-UNIT CAPS Take 1 tablet by mouth daily. 30 capsule 11   hydrOXYzine (VISTARIL) 25 MG capsule Take 1 capsule (25 mg total) by mouth every 8 (eight) hours as needed for anxiety (anxiety or aggitation, can use before going to dentist). 30 capsule 0   lisinopril-hydrochlorothiazide (ZESTORETIC) 10-12.5 MG tablet TAKE 1/2 TABLET BY MOUTH DAILY 30 tablet 11   loratadine (CLARITIN) 10 MG tablet Take 1 tablet (10 mg total) by mouth daily as needed for allergies. 90 tablet 3   Multiple Vitamins-Minerals (MULTIVITAMIN WITH MINERALS) tablet Take 1 tablet by mouth daily. 30 tablet 11   pravastatin (PRAVACHOL) 20 MG tablet Take 1 tablet (20 mg total) by mouth daily. 31 tablet 11   vitamin B-12 (CYANOCOBALAMIN) 100 MCG tablet TAKE 1 TABLET BY MOUTH ONCE DAILY FOR SUPPLEMENT 30 tablet 11   No current facility-administered medications for this visit.    Past Medical History:  Diagnosis Date   DNR (do not resuscitate) 08/29/2017   Hypercholesterolemia    Hypertension    Kyphosis of thoracic region    Mental retardation    Osteoporosis      Past Surgical History:  Procedure Laterality Date   ABDOMINAL HYSTERECTOMY     completed   COLONOSCOPY WITH PROPOFOL N/A 11/24/2019   Procedure: COLONOSCOPY WITH PROPOFOL;  Surgeon: Jonathon Bellows, MD;  Location: [facility] ENDOSCOPY;  Service: Gastroenterology;  Laterality: N/A;     Social History   Tobacco Use   Smoking status: Never   Smokeless tobacco: Never  Vaping Use   Vaping Use: Never used  Substance Use Topics   Alcohol use: No   Drug use: No      Family History  Problem Relation Age of Onset   Diabetes Mother    Stroke Mother    Cancer Father        brain   Breast cancer Maternal Aunt 44   Cancer Brother      No Known Allergies    REVIEW OF SYSTEMS (Negative unless checked)   Constitutional: [] Weight loss  [] Fever  [] Chills Cardiac: [] Chest pain   [] Chest pressure   [] Palpitations   [] Shortness of breath when laying flat   [] Shortness of breath at rest   [] Shortness of breath with exertion. Vascular:  [] Pain in legs with walking   [] Pain in legs at rest   [] Pain in legs when laying flat   [] Claudication   [] Pain in feet when walking  [] Pain in feet at rest  [] Pain in feet when laying flat   [] History of DVT   [] Phlebitis   [  x]Swelling in legs   [] Varicose veins   [] Non-healing ulcers Pulmonary:   [] Uses home oxygen   [] Productive cough   [] Hemoptysis   [] Wheeze  [] COPD   [] Asthma Neurologic:  [] Dizziness  [] Blackouts   [] Seizures   [] History of stroke   [] History of TIA  [] Aphasia   [] Temporary blindness   [] Dysphagia   [] Weakness or numbness in arms   [] Weakness or numbness in legs  X cognitive issues Musculoskeletal:  [] Arthritis   [] Joint swelling   [] Joint pain   [] Low back pain Hematologic:  [] Easy bruising  [] Easy bleeding   [] Hypercoagulable state   [] Anemic  [] Hepatitis Gastrointestinal:  [] Blood in stool   [] Vomiting blood  [] Gastroesophageal reflux/heartburn   [] Abdominal pain Genitourinary:  [] Chronic kidney disease   [] Difficult urination  [] Frequent  urination  [] Burning with urination   [] Hematuria Skin:  [] Rashes   [] Ulcers   [] Wounds Psychological:  [] History of anxiety   []  History of major depression.  Physical Examination  BP 116/81 (BP Location: Right Arm)   Pulse 80   Resp 16   Wt 141 lb (64 kg)   BMI 28.48 kg/m  Gen:  WD/WN, NAD Head: [place]/AT, No temporalis wasting. Ear/Nose/Throat: Hearing grossly intact, nares w/o erythema or drainage Eyes: Conjunctiva clear. Sclera non-icteric Neck: Supple.  Trachea midline Pulmonary:  Good air movement, no use of accessory muscles.  Cardiac: RRR, no JVD Vascular Vessel Right Left  Radial Palpable Palpable           Musculoskeletal: M/S 5/5 throughout.  No deformity or atrophy.  1+ bilateral lower extremity edema. Neurologic: Sensation grossly intact in extremities.  Symmetrical.  Patient nonverbal today Psychiatric: Judgment and insight are poor.  Patient unable to provide history. Dermatologic: No rashes or ulcers noted.  No cellulitis or open wounds.      Labs Recent Results (from the past 2160 hour(s))  Lipid panel     Status: None   Collection Time: 01/06/21 11:21 AM  Result Value Ref Range   Cholesterol 170 <200 mg/dL   HDL 61 > OR = 50 mg/dL   Triglycerides 131 <150 mg/dL   LDL Cholesterol (Calc) 86 mg/dL (calc)    Comment: Reference range: <100 . Desirable range <100 mg/dL for primary prevention;   <70 mg/dL for patients with CHD or diabetic patients  with > or = 2 CHD risk factors. Marland Kitchen LDL-C is now calculated using the Martin-Hopkins  calculation, which is a validated novel method providing  better accuracy than the Friedewald equation in the  estimation of LDL-C.  Cresenciano Genre et al. Annamaria Helling. 3086;578(46): 2061-2068  (http://education.[employer].com/faq/FAQ164)    Total CHOL/HDL Ratio 2.8 <5.0 (calc)   Non-HDL Cholesterol (Calc) 109 <130 mg/dL (calc)    Comment: For patients with diabetes plus 1 major ASCVD risk  factor, treating to a non-HDL-C goal of  <100 mg/dL  (LDL-C of <70 mg/dL) is considered a therapeutic  option.   COMPLETE METABOLIC PANEL WITH GFR     Status: Abnormal   Collection Time: 01/06/21 11:21 AM  Result Value Ref Range   Glucose, Bld 76 65 - 99 mg/dL    Comment: .            Fasting reference interval .    BUN 18 7 - 25 mg/dL   Creat 0.89 0.50 - 1.05 mg/dL    Comment: For patients >8 years of age, the reference limit for Creatinine is approximately 13% higher for people identified as African-American. .    GFR,  Est Non African American 71 > OR = 60 mL/min/1.40m2   GFR, Est African American 82 > OR = 60 mL/min/1.90m2   BUN/Creatinine Ratio NOT APPLICABLE 6 - 22 (calc)   Sodium 137 135 - 146 mmol/L   Potassium 4.5 3.5 - 5.3 mmol/L   Chloride 99 98 - 110 mmol/L   CO2 31 20 - 32 mmol/L   Calcium 11.0 (H) 8.6 - 10.4 mg/dL   Total Protein 7.1 6.1 - 8.1 g/dL   Albumin 4.4 3.6 - 5.1 g/dL   Globulin 2.7 1.9 - 3.7 g/dL (calc)   AG Ratio 1.6 1.0 - 2.5 (calc)   Total Bilirubin 0.5 0.2 - 1.2 mg/dL   Alkaline phosphatase (APISO) 72 37 - 153 U/L   AST 17 10 - 35 U/L   ALT 13 6 - 29 U/L  CBC with Differential/Platelet     Status: Abnormal   Collection Time: 01/06/21 11:21 AM  Result Value Ref Range   WBC 8.9 3.8 - 10.8 Thousand/uL   RBC 5.20 (H) 3.80 - 5.10 Million/uL   Hemoglobin 14.7 11.7 - 15.5 g/dL   HCT 45.2 (H) 35.0 - 45.0 %   MCV 86.9 80.0 - 100.0 fL   MCH 28.3 27.0 - 33.0 pg   MCHC 32.5 32.0 - 36.0 g/dL   RDW 13.4 11.0 - 15.0 %   Platelets 472 (H) 140 - 400 Thousand/uL   MPV 9.3 7.5 - 12.5 fL   Neutro Abs 4,557 1,500 - 7,800 cells/uL   Lymphs Abs 3,560 850 - 3,900 cells/uL   Absolute Monocytes 614 200 - 950 cells/uL   Eosinophils Absolute 80 15 - 500 cells/uL   Basophils Absolute 89 0 - 200 cells/uL   Neutrophils Relative % 51.2 %   Total Lymphocyte 40.0 %   Monocytes Relative 6.9 %   Eosinophils Relative 0.9 %   Basophils Relative 1.0 %    Radiology No results  found.  Assessment/Plan Hypertension blood pressure control important in reducing the progression of atherosclerotic disease. On appropriate oral medications.     Dyslipidemia lipid control important in reducing the progression of atherosclerotic disease. Continue statin therapy  Lymphedema Symptom control is reasonable.  Wearing compression stockings daily.  Uses a lymphedema pump every other day or so.  No new ulceration or swelling.  Activity level is good.  We will go to an annual follow-up at this point.    Leotis Pain, MD  02/22/2021 11:12 AM    This note was created with Dragon medical transcription system.  Any errors from dictation are purely unintentional

## 2021-02-22 NOTE — Assessment & Plan Note (Signed)
Symptom control is reasonable.  Wearing compression stockings daily.  Uses a lymphedema pump every other day or so.  No new ulceration or swelling.  Activity level is good.  We will go to an annual follow-up at this point.

## 2021-02-28 ENCOUNTER — Encounter: Payer: Self-pay | Admitting: Family Medicine

## 2021-03-24 ENCOUNTER — Telehealth: Payer: Self-pay

## 2021-03-24 NOTE — Telephone Encounter (Signed)
Copied from [place] [id number]. Topic: General - Other >> Mar 24, 2021 10:25 AM Alanda Slim E wrote: Reason for CRM: [name] called to see if the renewed med list was signed and ready to pick up for this pt and Neoma Laming / please advise

## 2021-03-24 NOTE — Telephone Encounter (Signed)
In your file to sign

## 2021-04-15 ENCOUNTER — Other Ambulatory Visit: Payer: Self-pay | Admitting: Family Medicine

## 2021-06-27 ENCOUNTER — Ambulatory Visit: Payer: Self-pay

## 2021-06-27 ENCOUNTER — Ambulatory Visit: Payer: Self-pay | Admitting: *Deleted

## 2021-06-27 NOTE — Telephone Encounter (Signed)
Appt made for both her and her sister

## 2021-06-27 NOTE — Telephone Encounter (Signed)
Tonya Lawson, caregiver at patient's group home requesting to verify appt time and provider. Reviewed with [name] need for designated party release needed. And to have family emergency contact clinic to receive information regarding patient . Ms . Tonya Lawson aware of appt time on 06/28/21 at 9:40. Call back to confirm provider if needed. [name] verbalized understanding .

## 2021-06-27 NOTE — Telephone Encounter (Signed)
Patient's caregiver, Carles Collet at [facility] (group home), says the patient was exposed to another resident who tested positive for COVID. She says the patient was tested yesterday at the facility by a RN and it was a positive test. Her symptoms started on Friday with a low grade fever and mild cough, runny nose. She says the temp was as high as 99.1, no fever today. She says the patient is complaining of leg pain and mentioned her throat being sore, reports some fatigue, no other symptoms reported. I advised an appointment, she says a virtual would be possible, there is a smart phone that can be used if needed for a virtual. No availability with PCP, Virtual appointment scheduled for tomorrow 06/28/21 at 0940 with Dr. Ky Barban, care advice given, patient's caregiver Janett Billow verbalized understanding.   Message from Scherrie Gerlach sent at 06/27/2021  8:42 AM EDT  Pt is covid positive. Pt has cough, runny nose, low grade fever.  Would like something called in to [facility].     Reason for Disposition  Fever present > 3 days (72 hours)  Answer Assessment - Initial Assessment Questions 1. COVID-19 DIAGNOSIS: "Who made your COVID-19 diagnosis?" "Was it confirmed by a positive lab test or self-test?" If not diagnosed by a doctor (or NP/PA), ask "Are there lots of cases (community spread) where you live?" Note: See public health department website, if unsure.     Home test 2. COVID-19 EXPOSURE: "Was there any known exposure to COVID before the symptoms began?" CDC Definition of close contact: within 6 feet (2 meters) for a total of 15 minutes or more over a 24-hour period.      Yes on last Tuesday 3. ONSET: "When did the COVID-19 symptoms start?"      Friday 4. WORST SYMPTOM: "What is your worst symptom?" (e.g., cough, fever, shortness of breath, muscle aches)     Legs hurting 5. COUGH: "Do you have a cough?" If Yes, ask: "How bad is the cough?"       Mild 6. FEVER: "Do you have  a fever?" If Yes, ask: "What is your temperature, how was it measured, and when did it start?"     No 7. RESPIRATORY STATUS: "Describe your breathing?" (e.g., shortness of breath, wheezing, unable to speak)      No 8. BETTER-SAME-WORSE: "Are you getting better, staying the same or getting worse compared to yesterday?"  If getting worse, ask, "In what way?"     Same 9. HIGH RISK DISEASE: "Do you have any chronic medical problems?" (e.g., asthma, heart or lung disease, weak immune system, obesity, etc.)     No 10. VACCINE: "Have you had the COVID-19 vaccine?" If Yes, ask: "Which one, how many shots, when did you get it?"       Yes Moderna x 2 11. BOOSTER: "Have you received your COVID-19 booster?" If Yes, ask: "Which one and when did you get it?"       2 boosters Moderna 12. PREGNANCY: "Is there any chance you are pregnant?" "When was your last menstrual period?"       No 13. OTHER SYMPTOMS: "Do you have any other symptoms?"  (e.g., chills, fatigue, headache, loss of smell or taste, muscle pain, sore throat)       Throat sore, some fatigue, legs hurt 14. O2 SATURATION MONITOR:  "Do you use an oxygen saturation monitor (pulse oximeter) at home?" If Yes, ask "What is your reading (oxygen level) today?" "What  is your usual oxygen saturation reading?" (e.g., 95%)       No recent reading  Protocols used: Coronavirus (COVID-19) Diagnosed or Suspected-A-AH

## 2021-06-28 ENCOUNTER — Encounter: Payer: Self-pay | Admitting: Family Medicine

## 2021-06-28 ENCOUNTER — Telehealth (INDEPENDENT_AMBULATORY_CARE_PROVIDER_SITE_OTHER): Payer: Medicare Other | Admitting: Family Medicine

## 2021-06-28 DIAGNOSIS — U071 COVID-19: Secondary | ICD-10-CM | POA: Diagnosis not present

## 2021-06-28 MED ORDER — MOLNUPIRAVIR EUA 200MG CAPSULE: 4.0000 | ORAL_CAPSULE | Freq: Two times a day (BID) | ORAL | 0 refills | Status: AC

## 2021-06-28 NOTE — Progress Notes (Signed)
Virtual Visit via Telephone Note  I connected with Tonya Lawson on 06/28/21 at  9:40 AM EDT by telephone and verified that I am speaking with the correct person using two identifiers.  Location: Patient: home Provider: [facility] Guss Bunde - caregiver at group home   I discussed the limitations, risks, security and privacy concerns of performing an evaluation and management service by telephone and the availability of in person appointments. I also discussed with the patient that there may be a patient responsible charge related to this service. The patient expressed understanding and agreed to proceed.   History of Present Illness:  UPPER RESPIRATORY TRACT INFECTION - symptom onset 10/16 - COVID+ 10/16 - lives in group home with several COVID contacts  Fever: yes, 101F Sunday, defervesced with tylenol. None since. Cough: yes Shortness of breath: no Wheezing: no Chest pain: no Chest tightness: no Chest congestion: yes Nasal congestion: yes Runny nose: yes Sneezing: yes Headache: no Vomiting: no Rash: no Fatigue: yes Sick contacts: no Relief with OTC cold/cough medications:  hasn't tried   Treatments attempted: none     Observations/Objective:  Patient had trouble connecting to video visit, entirety of visit conducted over the phone. Entirety of visit conducted with caregiver.   Assessment and Plan:  [id number]-76 Doing well with mild sx. Is a candidate for COVID treatment, molnupiravir sent to pharmacy. Reviewed OTC symptom relief, self-quarantine guidelines, and emergency precautions.     I discussed the assessment and treatment plan with the patient. The patient was provided an opportunity to ask questions and all were answered. The patient agreed with the plan and demonstrated an understanding of the instructions.   The patient was advised to call back or seek an in-person evaluation if the symptoms worsen or if the condition fails to improve as anticipated.  I  provided 16 minutes of non-face-to-face time during this encounter.   Myles Gip, DO

## 2021-06-28 NOTE — Patient Instructions (Addendum)
It was great to see you!  Our plans for today:  - See below for self-isolation guidelines. You may end your quarantine once you are 5 days from symptom onset and fever free for 24 hours without use of tylenol or ibuprofen. Wear a well-fitting N95 mask for an additional 5 days. - Take the molnupiravir as directed. See [email or web address] for more information about the medication. - You can take robatussin as needed for symptoms. - I recommend getting vaccinated with your booster once you are healed from your current infection. - Certainly, if you are having difficulties breathing or unable to keep down fluids, go to the Emergency Department.   Take care and seek immediate care sooner if you develop any concerns.   Dr. Ky Barban     Person Under Monitoring Name: Tonya Lawson  Location: [place] [address]   Infection Prevention Recommendations for Individuals Confirmed to have, or Being Evaluated for, 2019 Novel Coronavirus (COVID-19) Infection Who Receive Care at Home  Individuals who are confirmed to have, or are being evaluated for, COVID-19 should follow the prevention steps below until a healthcare provider or local or state health department says they can return to normal activities.  Stay home except to get medical care You should restrict activities outside your home, except for getting medical care. Do not go to work, school, or public areas, and do not use public transportation or taxis.  Call ahead before visiting your doctor Before your medical appointment, call the healthcare provider and tell them that you have, or are being evaluated for, COVID-19 infection. This will help the healthcare provider's office take steps to keep other people from getting infected. Ask your healthcare provider to call the local or state health department.  Monitor your symptoms Seek prompt medical attention if your illness is worsening (e.g., difficulty  breathing). Before going to your medical appointment, call the healthcare provider and tell them that you have, or are being evaluated for, COVID-19 infection. Ask your healthcare provider to call the local or state health department.  Wear a facemask You should wear a facemask that covers your nose and mouth when you are in the same room with other people and when you visit a healthcare provider. People who live with or visit you should also wear a facemask while they are in the same room with you.  Separate yourself from other people in your home As much as possible, you should stay in a different room from other people in your home. Also, you should use a separate bathroom, if available.  Avoid sharing household items You should not share dishes, drinking glasses, cups, eating utensils, towels, bedding, or other items with other people in your home. After using these items, you should wash them thoroughly with soap and water.  Cover your coughs and sneezes Cover your mouth and nose with a tissue when you cough or sneeze, or you can cough or sneeze into your sleeve. Throw used tissues in a lined trash can, and immediately wash your hands with soap and water for at least 20 seconds or use an alcohol-based hand rub.  Wash your [facility] your hands often and thoroughly with soap and water for at least 20 seconds. You can use an alcohol-based hand sanitizer if soap and water are not available and if your hands are not visibly dirty. Avoid touching your eyes, nose, and mouth with unwashed hands.   Prevention Steps for Caregivers and Household Members of Individuals Confirmed to  have, or Being Evaluated for, COVID-19 Infection Being Cared for in the Home  If you live with, or provide care at home for, a person confirmed to have, or being evaluated for, COVID-19 infection please follow these guidelines to prevent infection:  Follow healthcare provider's instructions Make sure that you  understand and can help the patient follow any healthcare provider instructions for all care.  Provide for the patient's basic needs You should help the patient with basic needs in the home and provide support for getting groceries, prescriptions, and other personal needs.  Monitor the patient's symptoms If they are getting sicker, call his or her medical provider and tell them that the patient has, or is being evaluated for, COVID-19 infection. This will help the healthcare provider's office take steps to keep other people from getting infected. Ask the healthcare provider to call the local or state health department.  Limit the number of people who have contact with the patient If possible, have only one caregiver for the patient. Other household members should stay in another home or place of residence. If this is not possible, they should stay in another room, or be separated from the patient as much as possible. Use a separate bathroom, if available. Restrict visitors who do not have an essential need to be in the home.  Keep older adults, very young children, and other sick people away from the patient Keep older adults, very young children, and those who have compromised immune systems or chronic health conditions away from the patient. This includes people with chronic heart, lung, or kidney conditions, diabetes, and cancer.  Ensure good ventilation Make sure that shared spaces in the home have good air flow, such as from an air conditioner or an opened window, weather permitting.  Wash your hands often Wash your hands often and thoroughly with soap and water for at least 20 seconds. You can use an alcohol based hand sanitizer if soap and water are not available and if your hands are not visibly dirty. Avoid touching your eyes, nose, and mouth with unwashed hands. Use disposable paper towels to dry your hands. If not available, use dedicated cloth towels and replace them when they  become wet.  Wear a facemask and gloves Wear a disposable facemask at all times in the room and gloves when you touch or have contact with the patient's blood, body fluids, and/or secretions or excretions, such as sweat, saliva, sputum, nasal mucus, vomit, urine, or feces.  Ensure the mask fits over your nose and mouth tightly, and do not touch it during use. Throw out disposable facemasks and gloves after using them. Do not reuse. Wash your hands immediately after removing your facemask and gloves. If your personal clothing becomes contaminated, carefully remove clothing and launder. Wash your hands after handling contaminated clothing. Place all used disposable facemasks, gloves, and other waste in a lined container before disposing them with other household waste. Remove gloves and wash your hands immediately after handling these items.  Do not share dishes, glasses, or other household items with the patient Avoid sharing household items. You should not share dishes, drinking glasses, cups, eating utensils, towels, bedding, or other items with a patient who is confirmed to have, or being evaluated for, COVID-19 infection. After the person uses these items, you should wash them thoroughly with soap and water.  Wash laundry thoroughly Immediately remove and wash clothes or bedding that have blood, body fluids, and/or secretions or excretions, such as sweat, saliva, sputum,  nasal mucus, vomit, urine, or feces, on them. Wear gloves when handling laundry from the patient. Read and follow directions on labels of laundry or clothing items and detergent. In general, wash and dry with the warmest temperatures recommended on the label.  Clean all areas the individual has used often Clean all touchable surfaces, such as counters, tabletops, doorknobs, bathroom fixtures, toilets, phones, keyboards, tablets, and bedside tables, every day. Also, clean any surfaces that may have blood, body fluids, and/or  secretions or excretions on them. Wear gloves when cleaning surfaces the patient has come in contact with. Use a diluted bleach solution (e.g., dilute bleach with 1 part bleach and 10 parts water) or a household disinfectant with a label that says EPA-registered for coronaviruses. To make a bleach solution at home, add 1 tablespoon of bleach to 1 quart (4 cups) of water. For a larger supply, add  cup of bleach to 1 gallon (16 cups) of water. Read labels of cleaning products and follow recommendations provided on product labels. Labels contain instructions for safe and effective use of the cleaning product including precautions you should take when applying the product, such as wearing gloves or eye protection and making sure you have good ventilation during use of the product. Remove gloves and wash hands immediately after cleaning.  Monitor yourself for signs and symptoms of illness Caregivers and household members are considered close contacts, should monitor their health, and will be asked to limit movement outside of the home to the extent possible. Follow the monitoring steps for close contacts listed on the symptom monitoring form.   ? If you have additional questions, contact your local health department or call the epidemiologist on call at [phone] (available 24/7). ? This guidance is subject to change. For the most up-to-date guidance from [facility], please refer to their website: [email or web address]

## 2021-07-08 ENCOUNTER — Encounter: Payer: Self-pay | Admitting: Nurse Practitioner

## 2021-07-08 ENCOUNTER — Ambulatory Visit (INDEPENDENT_AMBULATORY_CARE_PROVIDER_SITE_OTHER): Payer: Medicare Other | Admitting: Nurse Practitioner

## 2021-07-08 ENCOUNTER — Other Ambulatory Visit: Payer: Self-pay

## 2021-07-08 VITALS — BP 118/86 | HR 95 | Temp 98.2°F | Resp 16 | Ht 59.0 in | Wt 139.8 lb

## 2021-07-08 DIAGNOSIS — E785 Hyperlipidemia, unspecified: Secondary | ICD-10-CM | POA: Diagnosis not present

## 2021-07-08 DIAGNOSIS — Z23 Encounter for immunization: Secondary | ICD-10-CM | POA: Diagnosis not present

## 2021-07-08 DIAGNOSIS — R718 Other abnormality of red blood cells: Secondary | ICD-10-CM

## 2021-07-08 DIAGNOSIS — I10 Essential (primary) hypertension: Secondary | ICD-10-CM | POA: Diagnosis not present

## 2021-07-08 NOTE — Progress Notes (Addendum)
BP 118/86   Pulse 95   Temp 98.2 F (36.8 C)   Resp 16   Ht 4\' 11"  (1.499 m)   Wt 139 lb 12.8 oz (63.4 kg)   SpO2 98%   BMI 28.24 kg/m    Subjective:    Patient ID: Tonya Lawson, female    DOB: [birth year], 61 y.o.   MRN: [id number]  HPI: Tonya Lawson is a 60 y.o. female, here with sister and caregiver  Chief Complaint  Patient presents with   Follow-up   Hypertension   Hyperlipidemia   HTN:  Blood pressure today is 118/86.  She denies any chest pain or shortness of breath.  She does have some lower extremity edema but is wearing compression socks. She is currently taking lisinopril-hydrochlorothiazide 10-12.5 mg daily.  Care taker says she is taking her medication daily.   Hyperlipidemia:  Last LDL was 86 on 01/06/21, she is currently taking pravastatin 20 mg daily.  She denies any myalgia. Will recheck labs today.  Relevant past medical, surgical, family and social history reviewed and updated as indicated. Interim medical history since our last visit reviewed. Allergies and medications reviewed and updated.  Review of Systems  Constitutional: Negative for fever or weight change.  Respiratory: Negative for cough and shortness of breath.   Cardiovascular: Negative for chest pain or palpitations.  Gastrointestinal: Negative for abdominal pain, no bowel changes.  Musculoskeletal: Negative for gait problem or joint swelling.  Skin: Negative for rash.  Neurological: Negative for dizziness or headache. Positive for intellectual disability No other specific complaints in a complete review of systems (except as listed in HPI above).      Objective:    BP 118/86   Pulse 95   Temp 98.2 F (36.8 C)   Resp 16   Ht 4\' 11"  (1.499 m)   Wt 139 lb 12.8 oz (63.4 kg)   SpO2 98%   BMI 28.24 kg/m   Wt Readings from Last 3 Encounters:  07/08/21 139 lb 12.8 oz (63.4 kg)  02/22/21 141 lb (64 kg)  01/06/21 142 lb 11.2 oz (64.7 kg)    Physical Exam Constitutional: Patient  appears well-developed and well-nourished.  No distress.  HEENT: head atraumatic, normocephalic, pupils equal and reactive to light, neck supple Cardiovascular: Normal rate, regular rhythm and normal heart sounds.  No murmur heard. BLE non-pitting edema. Pulmonary/Chest: Effort normal and breath sounds normal. No respiratory distress. Abdominal: Soft.  There is no tenderness. Psychiatric: Patient has a normal mood and affect. behavior is normal. Judgment and thought content normal.   Results for orders placed or performed in visit on 01/06/21  Lipid panel  Result Value Ref Range   Cholesterol 170 <200 mg/dL   HDL 61 > OR = 50 mg/dL   Triglycerides 131 <150 mg/dL   LDL Cholesterol (Calc) 86 mg/dL (calc)   Total CHOL/HDL Ratio 2.8 <5.0 (calc)   Non-HDL Cholesterol (Calc) 109 <130 mg/dL (calc)  COMPLETE METABOLIC PANEL WITH GFR  Result Value Ref Range   Glucose, Bld 76 65 - 99 mg/dL   BUN 18 7 - 25 mg/dL   Creat 0.89 0.50 - 1.05 mg/dL   GFR, Est Non African American 71 > OR = 60 mL/min/1.58m2   GFR, Est African American 82 > OR = 60 mL/min/1.42m2   BUN/Creatinine Ratio NOT APPLICABLE 6 - 22 (calc)   Sodium 137 135 - 146 mmol/L   Potassium 4.5 3.5 - 5.3 mmol/L   Chloride 99 98 -  110 mmol/L   CO2 31 20 - 32 mmol/L   Calcium 11.0 (H) 8.6 - 10.4 mg/dL   Total Protein 7.1 6.1 - 8.1 g/dL   Albumin 4.4 3.6 - 5.1 g/dL   Globulin 2.7 1.9 - 3.7 g/dL (calc)   AG Ratio 1.6 1.0 - 2.5 (calc)   Total Bilirubin 0.5 0.2 - 1.2 mg/dL   Alkaline phosphatase (APISO) 72 37 - 153 U/L   AST 17 10 - 35 U/L   ALT 13 6 - 29 U/L  CBC with Differential/Platelet  Result Value Ref Range   WBC 8.9 3.8 - 10.8 Thousand/uL   RBC 5.20 (H) 3.80 - 5.10 Million/uL   Hemoglobin 14.7 11.7 - 15.5 g/dL   HCT 45.2 (H) 35.0 - 45.0 %   MCV 86.9 80.0 - 100.0 fL   MCH 28.3 27.0 - 33.0 pg   MCHC 32.5 32.0 - 36.0 g/dL   RDW 13.4 11.0 - 15.0 %   Platelets 472 (H) 140 - 400 Thousand/uL   MPV 9.3 7.5 - 12.5 fL   Neutro Abs  4,557 1,500 - 7,800 cells/uL   Lymphs Abs 3,560 850 - 3,900 cells/uL   Absolute Monocytes 614 200 - 950 cells/uL   Eosinophils Absolute 80 15 - 500 cells/uL   Basophils Absolute 89 0 - 200 cells/uL   Neutrophils Relative % 51.2 %   Total Lymphocyte 40.0 %   Monocytes Relative 6.9 %   Eosinophils Relative 0.9 %   Basophils Relative 1.0 %      Assessment & Plan:   1. Essential hypertension  - CBC with Differential/Platelet - COMPLETE METABOLIC PANEL WITH GFR  2. Dyslipidemia  - COMPLETE METABOLIC PANEL WITH GFR - Lipid panel  3. Hypercalcemia  - COMPLETE METABOLIC PANEL WITH GFR  4. Elevated hematocrit  - CBC with Differential/Platelet  5. Need for influenza vaccination  - Flu Vaccine QUAD 6+ mos PF IM (Fluarix Quad PF)   Follow up plan: Return in about 6 months (around 01/06/2022) for follow up.

## 2021-07-09 LAB — CBC WITH DIFFERENTIAL/PLATELET
Absolute Monocytes: 614 cells/uL (ref 200–950)
Basophils Absolute: 86 cells/uL (ref 0–200)
Basophils Relative: 0.9 %
Eosinophils Absolute: 163 cells/uL (ref 15–500)
Eosinophils Relative: 1.7 %
HCT: 43.2 % (ref 35.0–45.0)
Hemoglobin: 14.3 g/dL (ref 11.7–15.5)
Lymphs Abs: 3254 cells/uL (ref 850–3900)
MCH: 29.4 pg (ref 27.0–33.0)
MCHC: 33.1 g/dL (ref 32.0–36.0)
MCV: 88.7 fL (ref 80.0–100.0)
MPV: 9.2 fL (ref 7.5–12.5)
Monocytes Relative: 6.4 %
Neutro Abs: 5482 cells/uL (ref 1500–7800)
Neutrophils Relative %: 57.1 %
Platelets: 490 10*3/uL — ABNORMAL HIGH (ref 140–400)
RBC: 4.87 10*6/uL (ref 3.80–5.10)
RDW: 13.4 % (ref 11.0–15.0)
Total Lymphocyte: 33.9 %
WBC: 9.6 10*3/uL (ref 3.8–10.8)

## 2021-07-09 LAB — COMPLETE METABOLIC PANEL WITH GFR
AG Ratio: 1.8 (calc) (ref 1.0–2.5)
ALT: 11 U/L (ref 6–29)
AST: 15 U/L (ref 10–35)
Albumin: 4.4 g/dL (ref 3.6–5.1)
Alkaline phosphatase (APISO): 77 U/L (ref 37–153)
BUN: 12 mg/dL (ref 7–25)
CO2: 28 mmol/L (ref 20–32)
Calcium: 10.2 mg/dL (ref 8.6–10.4)
Chloride: 101 mmol/L (ref 98–110)
Creat: 0.81 mg/dL (ref 0.50–1.05)
Globulin: 2.5 g/dL (calc) (ref 1.9–3.7)
Glucose, Bld: 85 mg/dL (ref 65–99)
Potassium: 4 mmol/L (ref 3.5–5.3)
Sodium: 138 mmol/L (ref 135–146)
Total Bilirubin: 0.3 mg/dL (ref 0.2–1.2)
Total Protein: 6.9 g/dL (ref 6.1–8.1)
eGFR: 83 mL/min/{1.73_m2} (ref >=60)

## 2021-07-09 LAB — LIPID PANEL
Cholesterol: 166 mg/dL (ref <200)
HDL: 64 mg/dL (ref >=50)
LDL Cholesterol (Calc): 79 mg/dL (calc)
Non-HDL Cholesterol (Calc): 102 mg/dL (calc) (ref <130)
Total CHOL/HDL Ratio: 2.6 (calc) (ref <5.0)
Triglycerides: 135 mg/dL (ref <150)

## 2021-07-11 ENCOUNTER — Telehealth: Payer: Self-pay

## 2021-07-11 NOTE — Telephone Encounter (Signed)
Called back [place], [place] states pt missed 1 dose of her med but is back on. [place] just wanted to notify PCP.

## 2021-07-11 NOTE — Telephone Encounter (Signed)
Copied from [place] [phone]. Topic: General - Other >> Jul 11, 2021 12:30 PM Loma Boston wrote: Marye Round form [facility] states they had a med error and fail to give pt I dose of    pravastatin (PRAVACHOL) 20 MG tablet yesterday and has started back. Per [place] [phone]

## 2021-11-01 ENCOUNTER — Other Ambulatory Visit: Payer: Self-pay | Admitting: Unknown Physician Specialty

## 2021-11-01 DIAGNOSIS — Z1231 Encounter for screening mammogram for malignant neoplasm of breast: Secondary | ICD-10-CM

## 2021-11-14 ENCOUNTER — Telehealth: Payer: Self-pay

## 2021-11-14 NOTE — Telephone Encounter (Signed)
Copied from [place] [phone]. Topic: General - Call Back - No Documentation ?>> Nov 14, 2021 10:59 AM Scherrie Gerlach wrote: ?Reason for CRM: Tonya Lawson the pt's caregiver, needs to know what was done/checked for the pt when she came in for her last cpe. ?She can come by to pick up when ready. Or you can fax ?Fax  [phone] attn [place] ?

## 2021-11-14 NOTE — Telephone Encounter (Signed)
Printed pt las physical notes and labs done on 07/07/2020, will fax them over to [place]. ?

## 2021-11-14 NOTE — Telephone Encounter (Signed)
Called Tonya Lawson back to confirm exactly what she needs from [place] for pt. ?

## 2021-12-08 ENCOUNTER — Ambulatory Visit
Admission: RE | Admit: 2021-12-08 | Discharge: 2021-12-08 | Disposition: A | Discharge status: Home / Self Care | Payer: Medicare Other | Source: Ambulatory Visit | Attending: Unknown Physician Specialty | Admitting: Unknown Physician Specialty

## 2021-12-08 DIAGNOSIS — Z1231 Encounter for screening mammogram for malignant neoplasm of breast: Secondary | ICD-10-CM | POA: Insufficient documentation

## 2021-12-14 ENCOUNTER — Telehealth: Payer: Self-pay | Admitting: Family Medicine

## 2021-12-14 NOTE — Telephone Encounter (Signed)
Brittney Blunt, QP for the house, calling to report on 4/01, pt had a med error and missed her Cranberry, women's 1 /day vitamin, her Vit. B12 and her lisinopril.  ? ?  ?Cb [phone]  ext [phone] ?

## 2021-12-15 NOTE — Telephone Encounter (Signed)
Called Tonya Lawson back and confirmed patient is ok, she states everything is fine just wanted PCP to know of missed dose. ?

## 2021-12-20 NOTE — Progress Notes (Signed)
? ? ? ?    Annual Physical Exam  ? ?Name: Tonya Lawson   MRN: [id number]    DOB: Jul 24, 1961   Date:12/21/2021 ? ?Today's Provider: Talitha Givens, MHS, PA-C ?Introduced myself to the patient as a Journalist, newspaper and provided education on APPs in clinical practice.  ? ?      ? ?Subjective ? ?Chief Complaint : Annual physical exam, no acute complaints ? ? ? ?HPI ? ?Patient is here with a caregiver, Curlene Dolphin who is assisting with HPI ?Patient presents for annual CPE. ? ?Diet: Normal diet.  ?Exercise: States she walks 10-15 minutes per day.  ?Sleep: States she is sleeping okay  ?Mood: overall good, denies sadness, anxiety  ? ? ? ?Flowsheet Row Clinical Support from 12/23/2020 in [facility]  ?AUDIT-C Score 0  ? ?  ? ?Depression: Phq 9 is  negative ? ?  12/21/2021  ?  9:40 AM 12/21/2021  ?  9:38 AM 07/08/2021  ? 10:30 AM 06/28/2021  ?  8:39 AM 01/06/2021  ?  9:03 AM  ?Depression screen PHQ 2/9  ?Decreased Interest 0 0 0 0 1  ?Down, Depressed, Hopeless 0 0 0 0 0  ?PHQ - 2 Score 0 0 0 0 1  ?Altered sleeping 0  0 0 0  ?Tired, decreased energy 0  0 0 0  ?Change in appetite 0  0 0 0  ?Feeling bad or failure about yourself  0  0 0 0  ?Trouble concentrating 0  0 0 0  ?Moving slowly or fidgety/restless 0  0 0 0  ?Suicidal thoughts 0  0 0 0  ?PHQ-9 Score 0  0 0 1  ?Difficult doing work/chores   Not difficult at all Not difficult at all Not difficult at all  ? ?Hypertension: ?BP Readings from Last 3 Encounters:  ?12/21/21 112/72  ?07/08/21 118/86  ?02/22/21 116/81  ? ?Obesity: ?Wt Readings from Last 3 Encounters:  ?12/21/21 138 lb 8 oz (62.8 kg)  ?07/08/21 139 lb 12.8 oz (63.4 kg)  ?02/22/21 141 lb (64 kg)  ? ?BMI Readings from Last 3 Encounters:  ?12/21/21 27.97 kg/m?  ?07/08/21 28.24 kg/m?  ?02/22/21 28.48 kg/m?  ?  ? ?Vaccines:  ? ?HPV: aged out ?Tdap: Next due in 2029 ?Shingrix: order for 2nd dose provided today  ?Pneumonia: up to date ?Flu: due in the fall / up to date  ?COVID-19:Up to date  ? ? ?Hep C Screening:  completed ?STD testing and prevention (HIV/chl/gon/syphilis): completed HIV once  ?Intimate partner violence: negative screen  ?Incontinence Symptoms: negative for symptoms  ? ?Breast cancer:  ?- Last Mammogram: 11/2021 next due in one year ?- BRCA gene screening:  ? ?Osteoporosis Prevention : Discussed high calcium and vitamin D supplementation, weight bearing exercises ?Bone density :yes  ? ?Cervical cancer screening:  ? ?Skin cancer: Discussed monitoring for atypical lesions  ?Colorectal cancer: Screening performed, next due in 2031   ?Lung cancer:  Low Dose CT Chest recommended if Age 33-80 years, 20 pack-year currently smoking OR have quit w/in 15years. Patient does not qualify for screen   ?ECG: NA today  ? ?Advanced Care Planning: A voluntary discussion about advance care planning including the explanation and discussion of advance directives.  Discussed health care proxy and Living will, and the patient was able to identify a health care proxy.  Patient does not know have a living will and power of attorney of health care  ? ?Lipids: ?Lab Results  ?Component Value Date  ?  CHOL 166 07/08/2021  ? CHOL 170 01/06/2021  ? CHOL 158 10/24/2019  ? ?Lab Results  ?Component Value Date  ? HDL 64 07/08/2021  ? HDL 61 01/06/2021  ? HDL 61 10/24/2019  ? ?Lab Results  ?Component Value Date  ? [place] 79 07/08/2021  ? [place] 86 01/06/2021  ? [place] 71 10/24/2019  ? ?Lab Results  ?Component Value Date  ? TRIG 135 07/08/2021  ? TRIG 131 01/06/2021  ? TRIG 189 (H) 10/24/2019  ? ?Lab Results  ?Component Value Date  ? CHOLHDL 2.6 07/08/2021  ? CHOLHDL 2.8 01/06/2021  ? CHOLHDL 2.6 10/24/2019  ? ?No results found for: LDLDIRECT ? ?Glucose: ?Glucose, Bld  ?Date Value Ref Range Status  ?07/08/2021 85 65 - 99 mg/dL Final  ?  Comment:  ?  . ?           Fasting reference interval ?. ?  ?01/06/2021 76 65 - 99 mg/dL Final  ?  Comment:  ?  . ?           Fasting reference interval ?. ?  ?07/07/2020 94 65 - 99 mg/dL Final  ?  Comment:  ?   . ?           Fasting reference interval ?. ?  ? ? ?Patient Active Problem List  ? Diagnosis Date Noted  ? Lymphedema 08/24/2020  ? Swelling of limb 02/17/2020  ? Dyslipidemia 10/10/2018  ? Dental caries 10/10/2018  ? Overweight (BMI 25.0-29.9) 02/01/2018  ? Colon cancer screening 08/29/2017  ? DNR (do not resuscitate) 08/29/2017  ? 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 Hypertension   ? Osteoporosis   ? Intellectual disability   ? Kyphosis of thoracic region   ? ? ?Past Surgical History:  ?Procedure Laterality Date  ? ABDOMINAL HYSTERECTOMY    ? completed  ? COLONOSCOPY WITH PROPOFOL N/A 11/24/2019  ? Procedure: COLONOSCOPY WITH PROPOFOL;  Surgeon: Jonathon Bellows, MD;  Location: [facility] ENDOSCOPY;  Service: Gastroenterology;  Laterality: N/A;  ? ? ?Family History  ?Problem Relation Age of Onset  ? Diabetes Mother   ? Stroke Mother   ? Cancer Father   ?     brain  ? Breast cancer Maternal Aunt 93  ? Cancer Brother   ? ? ?Social History  ? ?Socioeconomic History  ? Marital status: Unknown  ?  Spouse name: Not on file  ? Number of children: Not on file  ? Years of education: Not on file  ? Highest education level: Not on file  ?Occupational History  ? Not on file  ?Tobacco Use  ? Smoking status: Never  ? Smokeless tobacco: Never  ?Vaping Use  ? Vaping Use: Never used  ?Substance and Sexual Activity  ? Alcohol use: No  ? Drug use: No  ? Sexual activity: Never  ?Other Topics Concern  ? Not on file  ?Social History Narrative  ? Patient is in a care home with [name].   ? ?Social Determinants of Health  ? ?Financial Resource Strain: Low Risk   ? Difficulty of Paying Living Expenses: Not hard at all  ?Food Insecurity: No Food Insecurity  ? Worried About Charity fundraiser in the Last Year: Never true  ? Ran Out of Food in the Last Year: Never true  ?Transportation Needs: No Transportation Needs  ? Lack of Transportation (Medical): No  ? Lack of Transportation (Non-Medical): No  ?Physical Activity:  Insufficiently Active  ? Days of Exercise per Week:  7 days  ? Minutes of Exercise per Session: 10 min  ?Stress: No Stress Concern Present  ? Feeling of Stress : Not at all  ?Social Connections: Socially Isolated  ? Frequency of Communication with Friends and Family: More than three times a week  ? Frequency of Social Gatherings with Friends and Family: More than three times a week  ? Attends Religious Services: Never  ? Active Member of Clubs or Organizations: No  ? Attends Archivist Meetings: Never  ? Marital Status: Never married  ?Intimate Partner Violence: Not At Risk  ? Fear of Current or Ex-Partner: No  ? Emotionally Abused: No  ? Physically Abused: No  ? Sexually Abused: No  ? ? ? ?Current Outpatient Medications:  ?  acetaminophen (TYLENOL) 500 MG tablet, Take 1 tablet (500 mg total) by mouth every 6 (six) hours as needed., Disp: 30 tablet, Rfl: 0 ?  Calcium Carbonate-Vitamin D 600-400 MG-UNIT tablet, Take 1 tablet by mouth 2 (two) times daily., Disp: 60 tablet, Rfl: 11 ?  CRANBERRY PLUS VITAMIN C 4200-20-3 MG-UNIT CAPS, TAKE 1 SOFTGEL BY MOUTH ONCE DAILY FOR URINARY HEALTH, Disp: 30 capsule, Rfl: 11 ?  hydrOXYzine (VISTARIL) 25 MG capsule, Take 1 capsule (25 mg total) by mouth every 8 (eight) hours as needed for anxiety (anxiety or aggitation, can use before going to dentist)., Disp: 30 capsule, Rfl: 0 ?  lisinopril-hydrochlorothiazide (ZESTORETIC) 10-12.5 MG tablet, TAKE 1/2 TABLET BY MOUTH DAILY, Disp: 30 tablet, Rfl: 11 ?  loratadine (CLARITIN) 10 MG tablet, Take 1 tablet (10 mg total) by mouth daily as needed for allergies., Disp: 90 tablet, Rfl: 3 ?  Multiple Vitamins-Minerals (GNP ONE DAILY WOMENS 50+) TABS, TAKE 1 TABLET BY MOUTH ONCE DAILY FOR SUPPLEMENT, Disp: 30 tablet, Rfl: 11 ?  pravastatin (PRAVACHOL) 20 MG tablet, Take 1 tablet (20 mg total) by mouth daily., Disp: 31 tablet, Rfl: 11 ?  vitamin B-12 (CYANOCOBALAMIN) 100 MCG tablet, TAKE 1 TABLET BY MOUTH ONCE DAILY FOR SUPPLEMENT,  Disp: 30 tablet, Rfl: 11 ? ?No Known Allergies ? ? ?Review of Systems  ?Constitutional:  Negative for chills, diaphoresis and fever.  ?HENT:  Negative for congestion, hearing loss, sore throat and tinnitus.   ?

## 2021-12-21 ENCOUNTER — Ambulatory Visit (INDEPENDENT_AMBULATORY_CARE_PROVIDER_SITE_OTHER): Payer: Medicare Other | Admitting: Physician Assistant

## 2021-12-21 ENCOUNTER — Encounter: Payer: Self-pay | Admitting: Physician Assistant

## 2021-12-21 VITALS — BP 112/72 | HR 88 | Temp 97.8°F | Resp 16 | Ht 59.0 in | Wt 138.5 lb

## 2021-12-21 DIAGNOSIS — Z23 Encounter for immunization: Secondary | ICD-10-CM

## 2021-12-21 DIAGNOSIS — Z0000 Encounter for general adult medical examination without abnormal findings: Secondary | ICD-10-CM | POA: Diagnosis not present

## 2021-12-21 DIAGNOSIS — E663 Overweight: Secondary | ICD-10-CM | POA: Diagnosis not present

## 2021-12-21 DIAGNOSIS — I10 Essential (primary) hypertension: Secondary | ICD-10-CM

## 2021-12-21 NOTE — Assessment & Plan Note (Signed)
Chronic, historic condition, appears stable ?Currently taking Lisinopril- HCTZ 10-12.5 mg 1/2 tablet PO BID  ?Also taking Pravastatin 20 mg PO QD  ?Appears stable on current medication ?She is walking and staying active as tolerated ?Continue current medications  ?Follow up recommended every 6 months for monitoring and maintenance ? ?

## 2021-12-21 NOTE — Assessment & Plan Note (Signed)
Chronic, historic condition ?Recommend increasing physical activity to 150 minutes of moderate intensity physical activity per week as tolerated  ?Follow up in 6 months for monitoring ?

## 2021-12-22 LAB — COMPLETE METABOLIC PANEL WITH GFR
AG Ratio: 1.6 (calc) (ref 1.0–2.5)
ALT: 12 U/L (ref 6–29)
AST: 16 U/L (ref 10–35)
Albumin: 4.3 g/dL (ref 3.6–5.1)
Alkaline phosphatase (APISO): 77 U/L (ref 37–153)
BUN: 14 mg/dL (ref 7–25)
CO2: 28 mmol/L (ref 20–32)
Calcium: 10.3 mg/dL (ref 8.6–10.4)
Chloride: 101 mmol/L (ref 98–110)
Creat: 0.88 mg/dL (ref 0.50–1.05)
Globulin: 2.7 g/dL (calc) (ref 1.9–3.7)
Glucose, Bld: 79 mg/dL (ref 65–99)
Potassium: 5 mmol/L (ref 3.5–5.3)
Sodium: 140 mmol/L (ref 135–146)
Total Bilirubin: 0.5 mg/dL (ref 0.2–1.2)
Total Protein: 7 g/dL (ref 6.1–8.1)
eGFR: 75 mL/min/{1.73_m2} (ref >=60)

## 2021-12-22 LAB — CBC WITH DIFFERENTIAL/PLATELET
Absolute Monocytes: 544 cells/uL (ref 200–950)
Basophils Absolute: 104 cells/uL (ref 0–200)
Basophils Relative: 1.3 %
Eosinophils Absolute: 80 cells/uL (ref 15–500)
Eosinophils Relative: 1 %
HCT: 44.8 % (ref 35.0–45.0)
Hemoglobin: 14.6 g/dL (ref 11.7–15.5)
Lymphs Abs: 3184 cells/uL (ref 850–3900)
MCH: 29.4 pg (ref 27.0–33.0)
MCHC: 32.6 g/dL (ref 32.0–36.0)
MCV: 90.3 fL (ref 80.0–100.0)
MPV: 9.4 fL (ref 7.5–12.5)
Monocytes Relative: 6.8 %
Neutro Abs: 4088 cells/uL (ref 1500–7800)
Neutrophils Relative %: 51.1 %
Platelets: 379 10*3/uL (ref 140–400)
RBC: 4.96 10*6/uL (ref 3.80–5.10)
RDW: 13.4 % (ref 11.0–15.0)
Total Lymphocyte: 39.8 %
WBC: 8 10*3/uL (ref 3.8–10.8)

## 2021-12-22 LAB — TSH: TSH: 2.77 mIU/L (ref 0.40–4.50)

## 2021-12-23 ENCOUNTER — Encounter: Payer: Medicare Other | Admitting: Family Medicine

## 2021-12-27 ENCOUNTER — Ambulatory Visit: Payer: Medicare Other

## 2022-01-06 ENCOUNTER — Ambulatory Visit (INDEPENDENT_AMBULATORY_CARE_PROVIDER_SITE_OTHER): Payer: Medicare Other | Admitting: Physician Assistant

## 2022-01-06 ENCOUNTER — Encounter: Payer: Self-pay | Admitting: Physician Assistant

## 2022-01-06 DIAGNOSIS — I10 Essential (primary) hypertension: Secondary | ICD-10-CM

## 2022-01-06 DIAGNOSIS — E785 Hyperlipidemia, unspecified: Secondary | ICD-10-CM

## 2022-01-06 NOTE — Progress Notes (Signed)
? ? ? ?     Established Patient Office Visit ? ?Name: Tonya Lawson   MRN: [id number]    DOB: 1961/09/10   Date:01/06/2022 ? ?Today's Provider: Talitha Givens, MHS, PA-C ?Introduced myself to the patient as a Journalist, newspaper and provided education on APPs in clinical practice.  ? ?      ?Subjective ? ?Chief Complaint ? ?Chief Complaint  ?Patient presents with  ? Follow-up  ? ? ?HPI ? ?Caretaker from assisted living facility is here with patient to assist with HPI ? ?HTN:  ?Taking Lisinopril-HCTZ 10-12.5 mg 1/2 tablet PO QD ?No concerns for elevated BP  ?Denies headaches, dizziness, chest pain, leg swelling ?Walking daily as she is able  ? ?HLD :  ? ?Currently taking Pravastatin 20 mg PO QD ?Most recent labs show well controlled lipids  ?Exercise: walking daily ?Normal diet ? ?Caretaker denies acute concerns or new problems ? ? ? ? ?Patient Active Problem List  ? Diagnosis Date Noted  ? Lymphedema 08/24/2020  ? Swelling of limb 02/17/2020  ? Dyslipidemia 10/10/2018  ? Dental caries 10/10/2018  ? Overweight (BMI 25.0-29.9) 02/01/2018  ? Colon cancer screening 08/29/2017  ? DNR (do not resuscitate) 08/29/2017  ? Essential (hemorrhagic) thrombocythemia ([place]) 04/07/2016  ? Hypertension   ? Osteoporosis   ? Intellectual disability   ? Kyphosis of thoracic region   ? ? ?Past Surgical History:  ?Procedure Laterality Date  ? ABDOMINAL HYSTERECTOMY    ? completed  ? COLONOSCOPY WITH PROPOFOL N/A 11/24/2019  ? Procedure: COLONOSCOPY WITH PROPOFOL;  Surgeon: Jonathon Bellows, MD;  Location: [facility] ENDOSCOPY;  Service: Gastroenterology;  Laterality: N/A;  ? ? ?Family History  ?Problem Relation Age of Onset  ? Diabetes Mother   ? Stroke Mother   ? Cancer Father   ?     brain  ? Breast cancer Maternal Aunt 5  ? Cancer Brother   ? ? ?Social History  ? ?Tobacco Use  ? Smoking status: Never  ? Smokeless tobacco: Never  ?Substance Use Topics  ? Alcohol use: No  ? ? ? ?Current Outpatient Medications:  ?  acetaminophen (TYLENOL) 500 MG tablet, Take 1 tablet  (500 mg total) by mouth every 6 (six) hours as needed., Disp: 30 tablet, Rfl: 0 ?  Calcium Carbonate-Vitamin D 600-400 MG-UNIT tablet, Take 1 tablet by mouth 2 (two) times daily., Disp: 60 tablet, Rfl: 11 ?  CRANBERRY PLUS VITAMIN C 4200-20-3 MG-UNIT CAPS, TAKE 1 SOFTGEL BY MOUTH ONCE DAILY FOR URINARY HEALTH, Disp: 30 capsule, Rfl: 11 ?  hydrOXYzine (VISTARIL) 25 MG capsule, Take 1 capsule (25 mg total) by mouth every 8 (eight) hours as needed for anxiety (anxiety or aggitation, can use before going to dentist)., Disp: 30 capsule, Rfl: 0 ?  lisinopril-hydrochlorothiazide (ZESTORETIC) 10-12.5 MG tablet, TAKE 1/2 TABLET BY MOUTH DAILY, Disp: 30 tablet, Rfl: 11 ?  loratadine (CLARITIN) 10 MG tablet, Take 1 tablet (10 mg total) by mouth daily as needed for allergies., Disp: 90 tablet, Rfl: 3 ?  Multiple Vitamins-Minerals (GNP ONE DAILY WOMENS 50+) TABS, TAKE 1 TABLET BY MOUTH ONCE DAILY FOR SUPPLEMENT, Disp: 30 tablet, Rfl: 11 ?  pravastatin (PRAVACHOL) 20 MG tablet, Take 1 tablet (20 mg total) by mouth daily., Disp: 31 tablet, Rfl: 11 ?  vitamin B-12 (CYANOCOBALAMIN) 100 MCG tablet, TAKE 1 TABLET BY MOUTH ONCE DAILY FOR SUPPLEMENT, Disp: 30 tablet, Rfl: 11 ? ?No Known Allergies ? ?I personally reviewed active problem list, medication list, allergies, lab results with the  patient/caregiver today. ? ? ?Review of Systems  ?Constitutional:  Positive for malaise/fatigue.  ?HENT:  Negative for congestion and sore throat.   ?Eyes:  Negative for blurred vision and double vision.  ?Respiratory:  Negative for cough and shortness of breath.   ?Cardiovascular:  Negative for chest pain and leg swelling.  ?Gastrointestinal:  Negative for constipation, diarrhea and vomiting.  ?Neurological:  Negative for dizziness and headaches.  ?Psychiatric/Behavioral:  Negative for depression. The patient is not nervous/anxious.   ? ? ? ?Objective ? ?Vitals:  ? 01/06/22 1017  ?BP: 128/74  ?Pulse: 65  ?Resp: 16  ?SpO2: 95%  ?Weight: 137 lb (62.1  kg)  ?Height: '4\' 11"'  (1.499 m)  ? ? ?Body mass index is 27.67 kg/m?. ? ?Physical Exam ?Vitals reviewed.  ?Constitutional:   ?   General: She is awake.  ?   Appearance: Normal appearance. She is well-developed, well-groomed and overweight.  ?HENT:  ?   Head: Normocephalic and atraumatic.  ?   Mouth/Throat:  ?   Mouth: Mucous membranes are moist.  ?Eyes:  ?   Conjunctiva/sclera: Conjunctivae normal.  ?   Pupils: Pupils are equal, round, and reactive to light.  ?Neck:  ?   Thyroid: No thyroid mass, thyromegaly or thyroid tenderness.  ?Cardiovascular:  ?   Rate and Rhythm: Normal rate and regular rhythm.  ?   Pulses: Normal pulses.  ?   Heart sounds: Normal heart sounds.  ?Pulmonary:  ?   Effort: Pulmonary effort is normal.  ?   Breath sounds: Normal breath sounds and air entry. No decreased breath sounds, wheezing, rhonchi or rales.  ?Musculoskeletal:  ?   Right lower leg: No edema.  ?   Left lower leg: No edema.  ?Lymphadenopathy:  ?   Head:  ?   Right side of head: No submental or submandibular adenopathy.  ?   Left side of head: No submental or submandibular adenopathy.  ?Neurological:  ?   Mental Status: She is alert.  ?   GCS: GCS eye subscore is 4. GCS verbal subscore is 5. GCS motor subscore is 6.  ?Psychiatric:     ?   Attention and Perception: Attention and perception normal.     ?   Mood and Affect: Mood and affect normal.     ?   Speech: Speech is delayed.     ?   Behavior: Behavior normal. Behavior is cooperative.     ?   Cognition and Memory: Cognition is impaired.  ? ? ? ?Recent Results (from the past 2160 hour(s))  ?CBC with Differential     Status: None  ? Collection Time: 12/21/21 10:19 AM  ?Result Value Ref Range  ? WBC 8.0 3.8 - 10.8 Thousand/uL  ? RBC 4.96 3.80 - 5.10 Million/uL  ? Hemoglobin 14.6 11.7 - 15.5 g/dL  ? HCT 44.8 35.0 - 45.0 %  ? MCV 90.3 80.0 - 100.0 fL  ? MCH 29.4 27.0 - 33.0 pg  ? MCHC 32.6 32.0 - 36.0 g/dL  ? RDW 13.4 11.0 - 15.0 %  ? Platelets 379 140 - 400 Thousand/uL  ? MPV 9.4  7.5 - 12.5 fL  ? Neutro Abs 4,088 1,500 - 7,800 cells/uL  ? Lymphs Abs 3,184 850 - 3,900 cells/uL  ? Absolute Monocytes 544 200 - 950 cells/uL  ? Eosinophils Absolute 80 15 - 500 cells/uL  ? Basophils Absolute 104 0 - 200 cells/uL  ? Neutrophils Relative % 51.1 %  ? Total Lymphocyte 39.8 %  ?  Monocytes Relative 6.8 %  ? Eosinophils Relative 1.0 %  ? Basophils Relative 1.3 %  ?TSH     Status: None  ? Collection Time: 12/21/21 10:19 AM  ?Result Value Ref Range  ? TSH 2.77 0.40 - 4.50 mIU/L  ?COMPLETE METABOLIC PANEL WITH GFR     Status: None  ? Collection Time: 12/21/21 10:19 AM  ?Result Value Ref Range  ? Glucose, Bld 79 65 - 99 mg/dL  ?  Comment: . ?           Fasting reference interval ?. ?  ? BUN 14 7 - 25 mg/dL  ? Creat 0.88 0.50 - 1.05 mg/dL  ? eGFR 75 > OR = 60 mL/min/1.72m  ?  Comment: The eGFR is based on the CKD-EPI 2021 equation. To calculate  ?the new eGFR from a previous Creatinine or Cystatin C ?result, go to https://www.kidney.org/professionals/ ?kdoqi/gfr%5Fcalculator ?  ? BUN/Creatinine Ratio NOT APPLICABLE 6 - 22 (calc)  ? Sodium 140 135 - 146 mmol/L  ? Potassium 5.0 3.5 - 5.3 mmol/L  ? Chloride 101 98 - 110 mmol/L  ? CO2 28 20 - 32 mmol/L  ? Calcium 10.3 8.6 - 10.4 mg/dL  ? Total Protein 7.0 6.1 - 8.1 g/dL  ? Albumin 4.3 3.6 - 5.1 g/dL  ? Globulin 2.7 1.9 - 3.7 g/dL (calc)  ? AG Ratio 1.6 1.0 - 2.5 (calc)  ? Total Bilirubin 0.5 0.2 - 1.2 mg/dL  ? Alkaline phosphatase (APISO) 77 37 - 153 U/L  ? AST 16 10 - 35 U/L  ? ALT 12 6 - 29 U/L  ? ? ? ?PHQ2/9: ? ?  01/06/2022  ? 10:16 AM 12/21/2021  ?  9:40 AM 12/21/2021  ?  9:38 AM 07/08/2021  ? 10:30 AM 06/28/2021  ?  8:39 AM  ?Depression screen PHQ 2/9  ?Decreased Interest 0 0 0 0 0  ?Down, Depressed, Hopeless 0 0 0 0 0  ?PHQ - 2 Score 0 0 0 0 0  ?Altered sleeping 0 0  0 0  ?Tired, decreased energy 0 0  0 0  ?Change in appetite 0 0  0 0  ?Feeling bad or failure about yourself  0 0  0 0  ?Trouble concentrating 0 0  0 0  ?Moving slowly or fidgety/restless 0 0  0  0  ?Suicidal thoughts 0 0  0 0  ?PHQ-9 Score 0 0  0 0  ?Difficult doing work/chores    Not difficult at all Not difficult at all  ?  ? ? ?Fall Risk: ? ?  01/06/2022  ? 10:16 AM 12/21/2021  ?  9:40 AM 10

## 2022-01-06 NOTE — Assessment & Plan Note (Signed)
Chronic, historic condition  ?Currently well managed with Pravastatin 20 mg PO QD ?Recommend recheck fasting lipid panel at follow up for monitoring ?Continue current medications ?Follow up in 6 months  ?

## 2022-01-06 NOTE — Assessment & Plan Note (Addendum)
Chronic, historic condition,  ?Currently well managed with Lisinopril-HCTZ 10-12.5 mg 1/2 tablet PO BID  ?Reports she is staying active as tolerable ?Reviewed most recent CBC, CMP, TSH - no abnormal results  ?Recommend continued exercise and heart healthy diet ?Continue current medications ?Follow up in 6 months  ?

## 2022-01-06 NOTE — Patient Instructions (Signed)
Continue current medications ?Follow up in 6 months for routine monitoring ? ?

## 2022-01-09 ENCOUNTER — Other Ambulatory Visit: Payer: Self-pay | Admitting: Family Medicine

## 2022-01-09 DIAGNOSIS — E785 Hyperlipidemia, unspecified: Secondary | ICD-10-CM

## 2022-01-09 DIAGNOSIS — I10 Essential (primary) hypertension: Secondary | ICD-10-CM

## 2022-01-24 ENCOUNTER — Ambulatory Visit (INDEPENDENT_AMBULATORY_CARE_PROVIDER_SITE_OTHER): Payer: Medicare Other

## 2022-01-24 DIAGNOSIS — Z0000 Encounter for general adult medical examination without abnormal findings: Secondary | ICD-10-CM | POA: Diagnosis not present

## 2022-01-24 NOTE — Patient Instructions (Signed)
Tonya Lawson , ?Thank you for taking time to come for your Medicare Wellness Visit. I appreciate your ongoing commitment to your health goals. Please review the following plan we discussed and let me know if I can assist you in the future.  ? ?Screening recommendations/referrals: ?Colonoscopy: done 11/24/19. Repeat 11/2029 ?Mammogram: done 12/08/21 ?Bone Density: done 07/22/20 ?Recommended yearly ophthalmology/optometry visit for glaucoma screening and checkup ?Recommended yearly dental visit for hygiene and checkup ? ?Vaccinations: ?Influenza vaccine: done 07/08/21 ?Pneumococcal vaccine: done 07/24/05 ?Tdap vaccine: done 12/13/17 ?Shingles vaccine: done 07/23/20 & 12/21/21   ?Covid-19:done 10/15/19, 11/13/19, 07/19/20, 07/19/21 ? ?Conditions/risks identified: Recommend increasing physical activity  ? ?Next appointment: Follow up in one year for your annual wellness visit  ? ? ?Preventive Care 34 Years and Older, Female ?Preventive care refers to lifestyle choices and visits with your health care provider that can promote health and wellness. ?What does preventive care include? ?A yearly physical exam. This is also called an annual well check. ?Dental exams once or twice a year. ?Routine eye exams. Ask your health care provider how often you should have your eyes checked. ?Personal lifestyle choices, including: ?Daily care of your teeth and gums. ?Regular physical activity. ?Eating a healthy diet. ?Avoiding tobacco and drug use. ?Limiting alcohol use. ?Practicing safe sex. ?Taking low-dose aspirin every day. ?Taking vitamin and mineral supplements as recommended by your health care provider. ?What happens during an annual well check? ?The services and screenings done by your health care provider during your annual well check will depend on your age, overall health, lifestyle risk factors, and family history of disease. ?Counseling  ?Your health care provider may ask you questions about your: ?Alcohol use. ?Tobacco use. ?Drug  use. ?Emotional well-being. ?Home and relationship well-being. ?Sexual activity. ?Eating habits. ?History of falls. ?Memory and ability to understand (cognition). ?Work and work Statistician. ?Reproductive health. ?Screening  ?You may have the following tests or measurements: ?Height, weight, and BMI. ?Blood pressure. ?Lipid and cholesterol levels. These may be checked every 5 years, or more frequently if you are over 38 years old. ?Skin check. ?Lung cancer screening. You may have this screening every year starting at age 58 if you have a 30-pack-year history of smoking and currently smoke or have quit within the past 15 years. ?Fecal occult blood test (FOBT) of the stool. You may have this test every year starting at age 60. ?Flexible sigmoidoscopy or colonoscopy. You may have a sigmoidoscopy every 5 years or a colonoscopy every 10 years starting at age 53. ?Hepatitis C blood test. ?Hepatitis B blood test. ?Sexually transmitted disease (STD) testing. ?Diabetes screening. This is done by checking your blood sugar (glucose) after you have not eaten for a while (fasting). You may have this done every 1-3 years. ?Bone density scan. This is done to screen for osteoporosis. You may have this done starting at age 38. ?Mammogram. This may be done every 1-2 years. Talk to your health care provider about how often you should have regular mammograms. ?Talk with your health care provider about your test results, treatment options, and if necessary, the need for more tests. ?Vaccines  ?Your health care provider may recommend certain vaccines, such as: ?Influenza vaccine. This is recommended every year. ?Tetanus, diphtheria, and acellular pertussis (Tdap, Td) vaccine. You may need a Td booster every 10 years. ?Zoster vaccine. You may need this after age 87. ?Pneumococcal 13-valent conjugate (PCV13) vaccine. One dose is recommended after age 5. ?Pneumococcal polysaccharide (PPSV23) vaccine. One dose is recommended  after age  97. ?Talk to your health care provider about which screenings and vaccines you need and how often you need them. ?This information is not intended to replace advice given to you by your health care provider. Make sure you discuss any questions you have with your health care provider. ?Document Released: 09/24/2015 Document Revised: 05/17/2016 Document Reviewed: 06/29/2015 ?Elsevier Interactive Patient Education ? 2017 [place]. ? ?Fall Prevention in the Home ?Falls can cause injuries. They can happen to people of all ages. There are many things you can do to make your home safe and to help prevent falls. ?What can I do on the outside of my home? ?Regularly fix the edges of walkways and driveways and fix any cracks. ?Remove anything that might make you trip as you walk through a door, such as a raised step or threshold. ?Trim any bushes or trees on the path to your home. ?Use bright outdoor lighting. ?Clear any walking paths of anything that might make someone trip, such as rocks or tools. ?Regularly check to see if handrails are loose or broken. Make sure that both sides of any steps have handrails. ?Any raised decks and porches should have guardrails on the edges. ?Have any leaves, snow, or ice cleared regularly. ?Use sand or salt on walking paths during winter. ?Clean up any spills in your garage right away. This includes oil or grease spills. ?What can I do in the bathroom? ?Use night lights. ?Install grab bars by the toilet and in the tub and shower. Do not use towel bars as grab bars. ?Use non-skid mats or decals in the tub or shower. ?If you need to sit down in the shower, use a plastic, non-slip stool. ?Keep the floor dry. Clean up any water that spills on the floor as soon as it happens. ?Remove soap buildup in the tub or shower regularly. ?Attach bath mats securely with double-sided non-slip rug tape. ?Do not have throw rugs and other things on the floor that can make you trip. ?What can I do in the  bedroom? ?Use night lights. ?Make sure that you have a light by your bed that is easy to reach. ?Do not use any sheets or blankets that are too big for your bed. They should not hang down onto the floor. ?Have a firm chair that has side arms. You can use this for support while you get dressed. ?Do not have throw rugs and other things on the floor that can make you trip. ?What can I do in the kitchen? ?Clean up any spills right away. ?Avoid walking on wet floors. ?Keep items that you use a lot in easy-to-reach places. ?If you need to reach something above you, use a strong step stool that has a grab bar. ?Keep electrical cords out of the way. ?Do not use floor polish or wax that makes floors slippery. If you must use wax, use non-skid floor wax. ?Do not have throw rugs and other things on the floor that can make you trip. ?What can I do with my stairs? ?Do not leave any items on the stairs. ?Make sure that there are handrails on both sides of the stairs and use them. Fix handrails that are broken or loose. Make sure that handrails are as long as the stairways. ?Check any carpeting to make sure that it is firmly attached to the stairs. Fix any carpet that is loose or worn. ?Avoid having throw rugs at the top or bottom of the stairs. If you  do have throw rugs, attach them to the floor with carpet tape. ?Make sure that you have a light switch at the top of the stairs and the bottom of the stairs. If you do not have them, ask someone to add them for you. ?What else can I do to help prevent falls? ?Wear shoes that: ?Do not have high heels. ?Have rubber bottoms. ?Are comfortable and fit you well. ?Are closed at the toe. Do not wear sandals. ?If you use a stepladder: ?Make sure that it is fully opened. Do not climb a closed stepladder. ?Make sure that both sides of the stepladder are locked into place. ?Ask someone to hold it for you, if possible. ?Clearly mark and make sure that you can see: ?Any grab bars or  handrails. ?First and last steps. ?Where the edge of each step is. ?Use tools that help you move around (mobility aids) if they are needed. These include: ?Canes. ?Walkers. ?Scooters. ?Crutches. ?Turn on the lights when you

## 2022-01-24 NOTE — Progress Notes (Signed)
Subjective:   Tonya Lawson is a 61 y.o. female who presents for Medicare Annual (Subsequent) preventive examination.  Virtual Visit via Telephone Note  I connected with  Tonya Lawson on 01/24/22 at 11:00 AM EDT by telephone and verified that I am speaking with the correct person using two identifiers.  Location: Patient: home Provider: [facility] Persons participating in the virtual visit: patient & Janett Billow, - [place]   I discussed the limitations, risks, security and privacy concerns of performing an evaluation and management service by telephone and the availability of in person appointments. The patient expressed understanding and agreed to proceed.  Interactive audio and video telecommunications were attempted between this nurse and patient, however failed, due to patient having technical difficulties OR patient did not have access to video capability.  We continued and completed visit with audio only.  Some vital signs may be absent or patient reported.   Clemetine Marker, LPN   Review of Systems     Cardiac Risk Factors include: hypertension;dyslipidemia     Objective:    There were no vitals filed for this visit. There is no height or weight on file to calculate BMI.     11/24/2019    9:18 AM 10/24/2019   11:28 AM 10/10/2018   11:29 AM 04/09/2017   10:09 AM 10/09/2016   10:01 AM 04/07/2016    8:12 AM  Advanced Directives  Does Patient Have a Medical Advance Directive? Yes Unable to assess, patient is non-responsive or altered mental status;Yes Yes No No No  Type of Advance Directive Out of facility DNR (pink MOST or yellow form) [place];Out of facility DNR (pink MOST or yellow form);Living will Out of facility DNR (pink MOST or yellow form)     Would patient like information on creating a medical advance directive?      No - patient declined information  Pre-existing out of facility DNR order (yellow form or  pink MOST form)   Pink MOST form placed in chart (order not valid for inpatient use)       Current Medications (verified) Outpatient Encounter Medications as of 01/24/2022  Medication Sig   acetaminophen (TYLENOL) 500 MG tablet Take 1 tablet (500 mg total) by mouth every 6 (six) hours as needed.   Calcium Carbonate-Vitamin D 600-400 MG-UNIT tablet Take 1 tablet by mouth 2 (two) times daily.   CRANBERRY PLUS VITAMIN C 4200-20-3 MG-UNIT CAPS TAKE 1 SOFTGEL BY MOUTH ONCE DAILY FOR URINARY HEALTH   hydrOXYzine (VISTARIL) 25 MG capsule Take 1 capsule (25 mg total) by mouth every 8 (eight) hours as needed for anxiety (anxiety or aggitation, can use before going to dentist).   lisinopril-hydrochlorothiazide (ZESTORETIC) 10-12.5 MG tablet TAKE 1/2 TABLET BY MOUTH DAILY   loratadine (CLARITIN) 10 MG tablet Take 1 tablet (10 mg total) by mouth daily as needed for allergies.   Multiple Vitamins-Minerals (GNP ONE DAILY WOMENS 50+) TABS TAKE 1 TABLET BY MOUTH ONCE DAILY FOR SUPPLEMENT   pravastatin (PRAVACHOL) 20 MG tablet TAKE 1 TABLET BY MOUTH DAILY   vitamin B-12 (CYANOCOBALAMIN) 100 MCG tablet TAKE 1 TABLET BY MOUTH ONCE DAILY FOR SUPPLEMENT   No facility-administered encounter medications on file as of 01/24/2022.    Allergies (verified) Patient has no known allergies.   History: Past Medical History:  Diagnosis Date   DNR (do not resuscitate) 08/29/2017   Hypercholesterolemia    Hypertension    Kyphosis of thoracic region  Mental retardation    Osteoporosis    Past Surgical History:  Procedure Laterality Date   ABDOMINAL HYSTERECTOMY     completed   COLONOSCOPY WITH PROPOFOL N/A 11/24/2019   Procedure: COLONOSCOPY WITH PROPOFOL;  Surgeon: Jonathon Bellows, MD;  Location: [facility];  Service: Gastroenterology;  Laterality: N/A;   Family History  Problem Relation Age of Onset   Diabetes Mother    Stroke Mother    Cancer Father        brain   Breast cancer Maternal Aunt 83   Cancer  Brother    Social History   Socioeconomic History   Marital status: Single    Spouse name: Not on file   Number of children: 0   Years of education: Not on file   Highest education level: Not on file  Occupational History   Not on file  Tobacco Use   Smoking status: Never   Smokeless tobacco: Never  Vaping Use   Vaping Use: Never used  Substance and Sexual Activity   Alcohol use: No   Drug use: No   Sexual activity: Never  Other Topics Concern   Not on file  Social History Narrative   Patient is in a care home with [name].    Social Determinants of Health   Financial Resource Strain: Low Risk    Difficulty of Paying Living Expenses: Not hard at all  Food Insecurity: No Food Insecurity   Worried About Charity fundraiser in the Last Year: Never true   [place] in the Last Year: Never true  Transportation Needs: No Transportation Needs   Lack of Transportation (Medical): No   Lack of Transportation (Non-Medical): No  Physical Activity: Inactive   Days of Exercise per Week: 0 days   Minutes of Exercise per Session: 0 min  Stress: No Stress Concern Present   Feeling of Stress : Not at all  Social Connections: Socially Isolated   Frequency of Communication with Friends and Family: More than three times a week   Frequency of Social Gatherings with Friends and Family: More than three times a week   Attends Religious Services: Never   Marine scientist or Organizations: No   Attends Music therapist: Never   Marital Status: Never married    Tobacco Counseling Counseling given: Not Answered   Clinical Intake:  Pre-visit preparation completed: Yes  Pain : No/denies pain     Nutritional Risks: None Diabetes: No  How often do you need to have someone help you when you read instructions, pamphlets, or other written materials from your doctor or pharmacy?: 1 - Never    Interpreter Needed?: No  Information entered by :: Clemetine Marker  LPN   Activities of Daily Living    01/24/2022   12:30 PM 01/06/2022   10:17 AM  In your present state of health, do you have any difficulty performing the following activities:  Hearing? 0 0  Vision? 0 0  Difficulty concentrating or making decisions? 1 1  Walking or climbing stairs? 0 0  Dressing or bathing? 1 1  Doing errands, shopping? 1 1  Preparing Food and eating ? N   Using the Toilet? N   In the past six months, have you accidently leaked urine? N   Do you have problems with loss of bowel control? N   Managing your Medications? Y   Managing your Finances? Y   Housekeeping or managing your Housekeeping? Darreld Mclean  Patient Care Team: Delsa Grana, PA-C as PCP - General (Family Medicine)  Indicate any recent Medical Services you may have received from other than [facility] providers in the past year (date may be approximate).     Assessment:   This is a routine wellness examination for [place].  Hearing/Vision screen Hearing Screening - Comments:: Pt denies hearing difficulty Vision Screening - Comments:: Vision screenings done by Dr. Matilde Sprang every 2 years  Dietary issues and exercise activities discussed: Current Exercise Habits: The patient does not participate in regular exercise at present, Exercise limited by: None identified   Goals Addressed             This Visit's Progress    Increase physical activity   Not on track    Recommend increasing physical activity to at least 150 minutes per week         Depression Screen    01/24/2022   12:29 PM 01/06/2022   10:16 AM 12/21/2021    9:40 AM 12/21/2021    9:38 AM 07/08/2021   10:30 AM 06/28/2021    8:39 AM 01/06/2021    9:03 AM  PHQ 2/9 Scores  PHQ - 2 Score 0 0 0 0 0 0 1  PHQ- 9 Score  0 0  0 0 1    Fall Risk    01/24/2022   12:30 PM 01/06/2022   10:16 AM 12/21/2021    9:40 AM 07/08/2021   10:30 AM 06/28/2021    8:39 AM  Fall Risk   Falls in the past year? 0 0 0 0 0  Number falls in past yr: 0 0  0 0   Injury with Fall? 0 0  0 0  Risk for fall due to : No Fall Risks No Fall Risks   No Fall Risks  Follow up Falls prevention discussed Falls prevention discussed Falls prevention discussed  Falls prevention discussed    FALL RISK PREVENTION PERTAINING TO THE HOME:  Any stairs in or around the home? No  If so, are there any without handrails? No  Home free of loose throw rugs in walkways, pet beds, electrical cords, etc? Yes  Adequate lighting in your home to reduce risk of falls? Yes   ASSISTIVE DEVICES UTILIZED TO PREVENT FALLS:  Life alert? No  Use of a cane, walker or w/c? No  Grab bars in the bathroom? Yes  Shower chair or bench in shower? Yes  Elevated toilet seat or a handicapped toilet? Yes   TIMED UP AND GO:  Was the test performed? No . Telephonic visit.   Cognitive Function: Cognitive status assessed by direct observation. Patient has current diagnosis of cognitive impairment. Patient is unable to complete screening 6CIT or MMSE.          08/29/2017   11:28 AM  6CIT Screen  What Year? 4 points  What month? 3 points  What time? 3 points  Count back from 20 4 points  Months in reverse 4 points  Repeat phrase 10 points  Total Score 28 points    Immunizations Immunization History  Administered Date(s) Administered   Influenza,inj,Quad PF,6+ Mos 08/02/2015, 07/12/2016, 06/28/2017, 06/17/2018, 06/09/2019, 07/07/2020, 07/08/2021   Moderna Covid Bivalent Peds Booster(36moThru 567yr 07/19/2021   Moderna Sars-Covid-2 Vaccination 10/15/2019, 11/13/2019, 07/19/2020   Pneumococcal Polysaccharide-23 06/23/2000, 07/24/2005   Td 06/20/2005   Tdap 12/13/2017   Zoster Recombinat (Shingrix) 07/23/2020, 12/21/2021   Zoster, Live 09/17/2013    TDAP status: Up to date  Flu Vaccine  status: Up to date  Pneumococcal vaccine status: Up to date  Covid-19 vaccine status: Completed vaccines  Qualifies for Shingles Vaccine? Yes   Zostavax completed Yes   Shingrix  Completed?: Yes  Screening Tests Health Maintenance  Topic Date Due   INFLUENZA VACCINE  04/11/2022   DEXA SCAN  07/22/2022   MAMMOGRAM  12/09/2022   TETANUS/TDAP  12/14/2027   COLONOSCOPY (Pts 45-4yr Insurance coverage will need to be confirmed)  11/23/2029   COVID-19 Vaccine  Completed   Hepatitis C Screening  Completed   HIV Screening  Completed   Zoster Vaccines- Shingrix  Completed   HPV VACCINES  Aged Out   Fecal DNA (Cologuard)  Discontinued    Health Maintenance  There are no preventive care reminders to display for this patient.  Colorectal cancer screening: Type of screening: Colonoscopy. Completed 11/24/19. Repeat every 10 years  Mammogram status: Completed 12/08/21. Repeat every year  Bone Density status: Completed 07/22/20. Results reflect: Bone density results: OSTEOPOROSIS. Repeat every 2 years.  Lung Cancer Screening: (Low Dose CT Chest recommended if Age 61-80years, 30 pack-year currently smoking OR have quit w/in 15years.) does not qualify.    Additional Screening:  Hepatitis C Screening: does qualify; Completed 08/29/17  Vision Screening: Recommended annual ophthalmology exams for early detection of glaucoma and other disorders of the eye. Is the patient up to date with their annual eye exam?  Yes  Who is the provider or what is the name of the office in which the patient attends annual eye exams? Dr. NMatilde Sprang   Dental Screening: Recommended annual dental exams for proper oral hygiene  Community Resource Referral / Chronic Care Management: CRR required this visit?  No   CCM required this visit?  No      Plan:     I have personally reviewed and noted the following in the patient's chart:   Medical and social history Use of alcohol, tobacco or illicit drugs  Current medications and supplements including opioid prescriptions.  Functional ability and status Nutritional status Physical activity Advanced directives List of other  physicians Hospitalizations, surgeries, and ER visits in previous 12 months Vitals Screenings to include cognitive, depression, and falls Referrals and appointments  In addition, I have reviewed and discussed with patient certain preventive protocols, quality metrics, and best practice recommendations. A written personalized care plan for preventive services as well as general preventive health recommendations were provided to patient.     KClemetine Marker LPN   [id number]/0093  Nurse Notes: none

## 2022-01-25 ENCOUNTER — Telehealth: Payer: Self-pay

## 2022-01-25 NOTE — Telephone Encounter (Signed)
Copied from [place] [id number]. Topic: General - Other >> Jan 25, 2022 10:34 AM Rayann Heman wrote: Reason for CRM: Jasmine calling from [employer] called and stated that they would need last physical notes. Please advise  Fax:[phone] Phone: [phone]  [phone]

## 2022-01-26 NOTE — Telephone Encounter (Signed)
Last CPE notes faxed

## 2022-02-02 ENCOUNTER — Ambulatory Visit: Payer: Medicare Other

## 2022-02-10 ENCOUNTER — Telehealth: Payer: Self-pay | Admitting: Family Medicine

## 2022-02-10 NOTE — Telephone Encounter (Signed)
Re-faxed.

## 2022-02-10 NOTE — Telephone Encounter (Signed)
Jasmine Mimms calling from [facility] is calling to request the signed STANDARD ROUTINE PRN MEDICATION Please advise  CB- [phone] Fax- [phone]

## 2022-02-14 NOTE — Telephone Encounter (Signed)
Pls refax Annual IPP signed, did not receive  fax [phone]

## 2022-02-15 NOTE — Telephone Encounter (Signed)
Waiting to be filled out by provider will fax once they are done.

## 2022-02-21 ENCOUNTER — Telehealth: Payer: Self-pay

## 2022-02-21 NOTE — Telephone Encounter (Addendum)
Jasmine would like you to know she DID receive the papers on both pts.  Thank you. Nothing further needed

## 2022-02-21 NOTE — Telephone Encounter (Signed)
Called Jasmine with [facility] to inform her papers were just faxed yesterday for both [facility] and [employer]. Unable to reach her left vm to call back office.

## 2022-02-28 ENCOUNTER — Ambulatory Visit (INDEPENDENT_AMBULATORY_CARE_PROVIDER_SITE_OTHER): Payer: Medicare Other | Admitting: Nurse Practitioner

## 2022-03-07 ENCOUNTER — Encounter (INDEPENDENT_AMBULATORY_CARE_PROVIDER_SITE_OTHER): Payer: Self-pay | Admitting: Nurse Practitioner

## 2022-03-07 ENCOUNTER — Ambulatory Visit (INDEPENDENT_AMBULATORY_CARE_PROVIDER_SITE_OTHER): Payer: Medicare Other | Admitting: Nurse Practitioner

## 2022-03-07 VITALS — BP 126/84 | HR 86 | Resp 16 | Ht 60.0 in | Wt 137.0 lb

## 2022-03-07 DIAGNOSIS — I890 Lymphedema, not elsewhere classified: Secondary | ICD-10-CM

## 2022-03-07 DIAGNOSIS — I10 Essential (primary) hypertension: Secondary | ICD-10-CM

## 2022-03-07 DIAGNOSIS — E785 Hyperlipidemia, unspecified: Secondary | ICD-10-CM | POA: Diagnosis not present

## 2022-03-07 NOTE — Progress Notes (Signed)
Subjective:    Patient ID: Tonya Lawson, female    DOB: July 07, 1961, 61 y.o.   MRN: [id number] No chief complaint on file.   The patient returns to the office for followup evaluation regarding leg swelling.  The swelling has persisted but with the lymph pump is under much, much better controlled. The pain associated with swelling is decreased. There have not been any interval development of a ulcerations or wounds.  The patient denies problems with the pump, noting it is working well and the leggings are in good condition.  Since the previous visit the patient has been wearing graduated compression stockings and using the lymph pump on a routine basis and  has noted significant improvement in the lymphedema.   Patient stated the lymph pump has been helpful with the treatment of the lymphedema.      Review of Systems  Cardiovascular:  Positive for leg swelling.  All other systems reviewed and are negative.      Objective:   Physical Exam Vitals reviewed.  HENT:     Head: Normocephalic.  Cardiovascular:     Rate and Rhythm: Normal rate.  Pulmonary:     Effort: Pulmonary effort is normal.  Musculoskeletal:     Right lower leg: Edema present.     Left lower leg: Edema present.  Skin:    General: Skin is warm and dry.  Neurological:     Mental Status: She is alert and oriented to person, place, and time.  Psychiatric:        Mood and Affect: Mood normal.        Behavior: Behavior normal.        Thought Content: Thought content normal.        Judgment: Judgment normal.    There were no vitals taken for this visit.  Past Medical History:  Diagnosis Date  . DNR (do not resuscitate) 08/29/2017  . Hypercholesterolemia   . Hypertension   . Kyphosis of thoracic region   . Mental retardation   . Osteoporosis     Social History   Socioeconomic History  . Marital status: Single    Spouse name: Not on file  . Number of children: 0  . Years of education: Not on file   . Highest education level: Not on file  Occupational History  . Not on file  Tobacco Use  . Smoking status: Never  . Smokeless tobacco: Never  Vaping Use  . Vaping Use: Never used  Substance and Sexual Activity  . Alcohol use: No  . Drug use: No  . Sexual activity: Never  Other Topics Concern  . Not on file  Social History Narrative   Patient is in a care home with [name].    Social Determinants of Health   Financial Resource Strain: Low Risk  (01/24/2022)   Overall Financial Resource Strain (CARDIA)   . Difficulty of Paying Living Expenses: Not hard at all  Food Insecurity: No Food Insecurity (01/24/2022)   Hunger Vital Sign   . Worried About Charity fundraiser in the Last Year: Never true   . Ran Out of Food in the Last Year: Never true  Transportation Needs: No Transportation Needs (01/24/2022)   PRAPARE - Transportation   . Lack of Transportation (Medical): No   . Lack of Transportation (Non-Medical): No  Physical Activity: Inactive (01/24/2022)   Exercise Vital Sign   . Days of Exercise per Week: 0 days   . Minutes of Exercise  per Session: 0 min  Stress: No Stress Concern Present (01/24/2022)   [place]   . Feeling of Stress : Not at all  Social Connections: Socially Isolated (01/24/2022)   Social Connection and Isolation Panel [NHANES]   . Frequency of Communication with Friends and Family: More than three times a week   . Frequency of Social Gatherings with Friends and Family: More than three times a week   . Attends Religious Services: Never   . Active Member of Clubs or Organizations: No   . Attends Archivist Meetings: Never   . Marital Status: Never married  Intimate Partner Violence: Not At Risk (01/24/2022)   Humiliation, Afraid, Rape, and Kick questionnaire   . Fear of Current or Ex-Partner: No   . Emotionally Abused: No   . Physically Abused: No   . Sexually Abused: No     Past Surgical History:  Procedure Laterality Date  . ABDOMINAL HYSTERECTOMY     completed  . COLONOSCOPY WITH PROPOFOL N/A 11/24/2019   Procedure: COLONOSCOPY WITH PROPOFOL;  Surgeon: Jonathon Bellows, MD;  Location: [facility] ENDOSCOPY;  Service: Gastroenterology;  Laterality: N/A;    Family History  Problem Relation Age of Onset  . Diabetes Mother   . Stroke Mother   . Cancer Father        brain  . Breast cancer Maternal Aunt 70  . Cancer Brother     No Known Allergies     Latest Ref Rng & Units 12/21/2021   10:19 AM 07/08/2021   10:50 AM 01/06/2021   11:21 AM  CBC  WBC 3.8 - 10.8 Thousand/uL 8.0  9.6  8.9   Hemoglobin 11.7 - 15.5 g/dL 14.6  14.3  14.7   Hematocrit 35.0 - 45.0 % 44.8  43.2  45.2   Platelets 140 - 400 Thousand/uL 379  490  472       CMP     Component Value Date/Time   NA 140 12/21/2021 1019   NA 143 10/09/2016 1207   K 5.0 12/21/2021 1019   CL 101 12/21/2021 1019   CO2 28 12/21/2021 1019   GLUCOSE 79 12/21/2021 1019   BUN 14 12/21/2021 1019   BUN 13 10/09/2016 1207   CREATININE 0.88 12/21/2021 1019   CALCIUM 10.3 12/21/2021 1019   PROT 7.0 12/21/2021 1019   PROT 6.5 10/09/2016 1207   ALBUMIN 4.1 04/09/2017 1113   ALBUMIN 4.1 10/09/2016 1207   AST 16 12/21/2021 1019   ALT 12 12/21/2021 1019   ALKPHOS 87 04/09/2017 1113   BILITOT 0.5 12/21/2021 1019   BILITOT 0.2 10/09/2016 1207   GFRNONAA 71 01/06/2021 1121   GFRAA 82 01/06/2021 1121     No results found.     Assessment & Plan:   1. Lymphedema Recommend:  No surgery or intervention at this point in time.    I have reviewed my discussion with the patient regarding lymphedema and why it  causes symptoms.  Patient will continue wearing graduated compression on a daily basis. The patient should put the compression on first thing in the morning and removing them in the evening. The patient should not sleep in the compression.   In addition, behavioral modification throughout the day will  be continued.  This will include frequent elevation (such as in a recliner), use of over the counter pain medications as needed and exercise such as walking.  The systemic causes for chronic edema such as liver,  kidney and cardiac etiologies does not appear to have significant changed over the past year.    The patient will continue aggressive use of the  lymph pump.  This will continue to improve the edema control and prevent sequela such as ulcers and infections.   The patient will follow-up with me on an annual basis.    2. Primary hypertension Continue antihypertensive medications as already ordered, these medications have been reviewed and there are no changes at this time.   3. Dyslipidemia Continue statin as ordered and reviewed, no changes at this time    Current Outpatient Medications on File Prior to Visit  Medication Sig Dispense Refill  . acetaminophen (TYLENOL) 500 MG tablet Take 1 tablet (500 mg total) by mouth every 6 (six) hours as needed. 30 tablet 0  . Calcium Carbonate-Vitamin D 600-400 MG-UNIT tablet Take 1 tablet by mouth 2 (two) times daily. 60 tablet 11  . CRANBERRY PLUS VITAMIN C 4200-20-3 MG-UNIT CAPS TAKE 1 SOFTGEL BY MOUTH ONCE DAILY FOR URINARY HEALTH 30 capsule 11  . hydrOXYzine (VISTARIL) 25 MG capsule Take 1 capsule (25 mg total) by mouth every 8 (eight) hours as needed for anxiety (anxiety or aggitation, can use before going to dentist). 30 capsule 0  . lisinopril-hydrochlorothiazide (ZESTORETIC) 10-12.5 MG tablet TAKE 1/2 TABLET BY MOUTH DAILY 30 tablet 11  . loratadine (CLARITIN) 10 MG tablet Take 1 tablet (10 mg total) by mouth daily as needed for allergies. 90 tablet 3  . Multiple Vitamins-Minerals (GNP ONE DAILY WOMENS 50+) TABS TAKE 1 TABLET BY MOUTH ONCE DAILY FOR SUPPLEMENT 30 tablet 11  . pravastatin (PRAVACHOL) 20 MG tablet TAKE 1 TABLET BY MOUTH DAILY 31 tablet 11  . vitamin B-12 (CYANOCOBALAMIN) 100 MCG tablet TAKE 1 TABLET BY MOUTH ONCE DAILY FOR  SUPPLEMENT 30 tablet 11   No current facility-administered medications on file prior to visit.    There are no Patient Instructions on file for this visit. No follow-ups on file.   Kris Hartmann, NP

## 2022-03-09 ENCOUNTER — Other Ambulatory Visit: Payer: Self-pay | Admitting: Family Medicine

## 2022-05-08 DIAGNOSIS — H2513 Age-related nuclear cataract, bilateral: Secondary | ICD-10-CM | POA: Diagnosis not present

## 2022-05-08 DIAGNOSIS — H5203 Hypermetropia, bilateral: Secondary | ICD-10-CM | POA: Diagnosis not present

## 2022-06-05 ENCOUNTER — Other Ambulatory Visit: Payer: Self-pay | Admitting: Family Medicine

## 2022-06-05 DIAGNOSIS — F419 Anxiety disorder, unspecified: Secondary | ICD-10-CM

## 2022-06-15 DIAGNOSIS — H93293 Other abnormal auditory perceptions, bilateral: Secondary | ICD-10-CM | POA: Diagnosis not present

## 2022-06-15 DIAGNOSIS — H6983 Other specified disorders of Eustachian tube, bilateral: Secondary | ICD-10-CM | POA: Diagnosis not present

## 2022-06-15 DIAGNOSIS — H6122 Impacted cerumen, left ear: Secondary | ICD-10-CM | POA: Diagnosis not present

## 2022-07-07 ENCOUNTER — Other Ambulatory Visit: Payer: Self-pay | Admitting: Physician Assistant

## 2022-07-10 ENCOUNTER — Encounter: Payer: Self-pay | Admitting: Family Medicine

## 2022-07-10 ENCOUNTER — Ambulatory Visit (INDEPENDENT_AMBULATORY_CARE_PROVIDER_SITE_OTHER): Payer: Medicare Other | Admitting: Family Medicine

## 2022-07-10 VITALS — BP 116/74 | HR 107 | Temp 98.3°F | Resp 16 | Ht 60.0 in | Wt 138.4 lb

## 2022-07-10 DIAGNOSIS — I10 Essential (primary) hypertension: Secondary | ICD-10-CM

## 2022-07-10 DIAGNOSIS — Z23 Encounter for immunization: Secondary | ICD-10-CM

## 2022-07-10 DIAGNOSIS — M810 Age-related osteoporosis without current pathological fracture: Secondary | ICD-10-CM

## 2022-07-10 DIAGNOSIS — R000 Tachycardia, unspecified: Secondary | ICD-10-CM | POA: Diagnosis not present

## 2022-07-10 DIAGNOSIS — Z780 Asymptomatic menopausal state: Secondary | ICD-10-CM | POA: Diagnosis not present

## 2022-07-10 DIAGNOSIS — E785 Hyperlipidemia, unspecified: Secondary | ICD-10-CM

## 2022-07-10 DIAGNOSIS — I890 Lymphedema, not elsewhere classified: Secondary | ICD-10-CM

## 2022-07-10 DIAGNOSIS — F419 Anxiety disorder, unspecified: Secondary | ICD-10-CM | POA: Diagnosis not present

## 2022-07-10 DIAGNOSIS — E663 Overweight: Secondary | ICD-10-CM

## 2022-07-10 DIAGNOSIS — Z5181 Encounter for therapeutic drug level monitoring: Secondary | ICD-10-CM | POA: Diagnosis not present

## 2022-07-10 NOTE — Progress Notes (Unsigned)
Name: Tonya Lawson   MRN: [id number]    DOB: 28-Feb-1961   Date:07/10/2022       Progress Note  Chief Complaint  Patient presents with   Follow-up   Hypertension   Hyperlipidemia   Anxiety     Subjective:   Tonya Lawson is a 61 y.o. female, presents to clinic for routine f/up  Hypertension:  Currently managed on lisinopril-HCTZ 10-12.5 Pt reports good med compliance and denies any SE.   Blood pressure today is well controlled. BP Readings from Last 3 Encounters:  07/10/22 116/74  03/07/22 126/84  01/06/22 128/74   Pt denies CP, SOB, exertional sx, LE edema, palpitation, Ha's, visual disturbances, lightheadedness, hypotension, syncope. Dietary efforts for BP?  Healthy/balanced at group home   Hyperlipidemia: Currently treated with pravastatin 20, pt reports good med compliance Last Lipids: Lab Results  Component Value Date   CHOL 166 07/08/2021   HDL 64 07/08/2021   LDLCALC 79 07/08/2021   TRIG 135 07/08/2021   CHOLHDL 2.6 07/08/2021   - Denies: Chest pain, shortness of breath, myalgias, claudication  LE edema stable, wearing compression socks and recently saw vascular for f/up  Anxiety/mood/behavior - some increased behavioral issues with pts mother unable to visit at adult group home  Tachycardic here today, she says she can feel it beating fast but denies CP, no issues of DOE/exertional sx, syncope, CP, diaphoresis   Current Outpatient Medications:    acetaminophen (TYLENOL) 500 MG tablet, Take 1 tablet (500 mg total) by mouth every 6 (six) hours as needed., Disp: 30 tablet, Rfl: 0   Calcium Carbonate-Vitamin D 600-400 MG-UNIT tablet, Take 1 tablet by mouth 2 (two) times daily., Disp: 60 tablet, Rfl: 11   CRANBERRY PLUS VITAMIN C 4200-20-3 MG-UNIT CAPS, TAKE 1 SOFTGEL BY MOUTH ONCE DAILY FOR URINARY HEALTH, Disp: 30 capsule, Rfl: 3   hydrOXYzine (VISTARIL) 25 MG capsule, TAKE 1 CAPSULET BY MOUTH DAILY EVERY 8 HOURS AS NEEDED FOR ANXIETY, CAN BE  USEDBEFORE GOING TO DENTIST., Disp: 30 capsule, Rfl: 11   lisinopril-hydrochlorothiazide (ZESTORETIC) 10-12.5 MG tablet, TAKE 1/2 TABLET BY MOUTH DAILY, Disp: 30 tablet, Rfl: 11   loratadine (CLARITIN) 10 MG tablet, Take 1 tablet (10 mg total) by mouth daily as needed for allergies., Disp: 90 tablet, Rfl: 3   Multiple Vitamins-Minerals (GNP ONE DAILY WOMENS 50+) TABS, TAKE 1 TABLET BY MOUTH ONCE DAILY FOR SUPPLEMENT, Disp: 30 tablet, Rfl: 3   pravastatin (PRAVACHOL) 20 MG tablet, TAKE 1 TABLET BY MOUTH DAILY, Disp: 31 tablet, Rfl: 11   vitamin B-12 (CYANOCOBALAMIN) 100 MCG tablet, TAKE 1 TABLET BY MOUTH ONCE DAILY FOR SUPPLEMENT, Disp: 30 tablet, Rfl: 3  Patient Active Problem List   Diagnosis Date Noted   Lymphedema 08/24/2020   Swelling of limb 02/17/2020   Dyslipidemia 10/10/2018   Dental caries 10/10/2018   Overweight (BMI 25.0-29.9) 02/01/2018   Colon cancer screening 08/29/2017   DNR (do not resuscitate) 08/29/2017   Essential (hemorrhagic) thrombocythemia ([place]) 04/07/2016   Hypertension    Osteoporosis    Intellectual disability    Kyphosis of thoracic region     Past Surgical History:  Procedure Laterality Date   ABDOMINAL HYSTERECTOMY     completed   COLONOSCOPY WITH PROPOFOL N/A 11/24/2019   Procedure: COLONOSCOPY WITH PROPOFOL;  Surgeon: Jonathon Bellows, MD;  Location: [facility] ENDOSCOPY;  Service: Gastroenterology;  Laterality: N/A;    Family History  Problem Relation Age of Onset   Diabetes Mother    Stroke Mother  Cancer Father        brain   Breast cancer Maternal Aunt 43   Cancer Brother     Social History   Tobacco Use   Smoking status: Never   Smokeless tobacco: Never  Vaping Use   Vaping Use: Never used  Substance Use Topics   Alcohol use: No   Drug use: No     No Known Allergies  Health Maintenance  Topic Date Due   COVID-19 Vaccine (5 - Moderna risk series) 07/26/2022 (Originally 09/13/2021)   DEXA SCAN  07/22/2022   MAMMOGRAM  12/09/2022    Medicare Annual Wellness (AWV)  01/25/2023   TETANUS/TDAP  12/14/2027   COLONOSCOPY (Pts 45-35yr Insurance coverage will need to be confirmed)  11/23/2029   INFLUENZA VACCINE  Completed   Hepatitis C Screening  Completed   HIV Screening  Completed   Zoster Vaccines- Shingrix  Completed   HPV VACCINES  Aged Out   Fecal DNA (Cologuard)  Discontinued    Chart Review Today: I personally reviewed active problem list, medication list, allergies, family history, social history, health maintenance, notes from last encounter, lab results, imaging with the patient/caregiver today.   Review of Systems  Constitutional: Negative.   HENT: Negative.    Eyes: Negative.   Respiratory: Negative.    Cardiovascular: Negative.   Gastrointestinal: Negative.   Endocrine: Negative.   Genitourinary: Negative.   Musculoskeletal: Negative.   Skin: Negative.   Allergic/Immunologic: Negative.   Neurological: Negative.   Hematological: Negative.   Psychiatric/Behavioral: Negative.    All other systems reviewed and are negative.    Objective:   Vitals:   07/10/22 0855 07/10/22 0919  BP: 116/74   Pulse: (!) 111 (!) 107  Resp: 16   Temp: 98.3 F (36.8 C)   TempSrc: Oral   SpO2: 95%   Weight: 138 lb 6.4 oz (62.8 kg)   Height: 5' (1.524 m)   HR rechecked 2x during exam 121 and later 135 (manual and then with pulse ox)  Body mass index is 27.03 kg/m.  Physical Exam Vitals and nursing note reviewed.  Constitutional:      General: She is not in acute distress.    Appearance: Normal appearance. She is well-developed. She is obese. She is not ill-appearing, toxic-appearing or diaphoretic.  HENT:     Head: Normocephalic and atraumatic.     Nose: Nose normal.  Eyes:     General: No scleral icterus.       Right eye: No discharge.        Left eye: No discharge.     Conjunctiva/sclera: Conjunctivae normal.  Neck:     Trachea: No tracheal deviation.  Cardiovascular:     Rate and Rhythm: Regular  rhythm. Tachycardia present.     Pulses: Normal pulses.     Heart sounds: Normal heart sounds. No murmur heard.    No friction rub. No gallop.     Comments: Wearing compression stocking b/l Pulmonary:     Effort: Pulmonary effort is normal. No respiratory distress.     Breath sounds: No stridor.  Abdominal:     General: Bowel sounds are normal.     Palpations: Abdomen is soft.  Musculoskeletal:     Right lower leg: Edema present.     Left lower leg: Edema present.  Skin:    General: Skin is warm and dry.     Findings: No rash.  Neurological:     Mental Status: She is alert. Mental status  is at baseline.     Motor: No abnormal muscle tone.     Coordination: Coordination normal.     Gait: Gait normal.  Psychiatric:        Mood and Affect: Mood normal.        Behavior: Behavior normal.         Assessment & Plan:   Problem List Items Addressed This Visit       Cardiovascular and Mediastinum   Hypertension - Primary (Chronic)    Stable, well controlled and at goal today Continue lisinopril-hctz 10-12.5      Relevant Orders   COMPLETE METABOLIC PANEL WITH GFR (Completed)     Musculoskeletal and Integument   Osteoporosis (Chronic)    Due for repeat dexa        Other   Overweight (BMI 25.0-29.9)    Weights stable Wt Readings from Last 5 Encounters:  07/10/22 138 lb 6.4 oz (62.8 kg)  03/07/22 137 lb (62.1 kg)  01/06/22 137 lb (62.1 kg)  12/21/21 138 lb 8 oz (62.8 kg)  07/08/21 139 lb 12.8 oz (63.4 kg)   BMI Readings from Last 5 Encounters:  07/10/22 27.03 kg/m  03/07/22 26.76 kg/m  01/06/22 27.67 kg/m  12/21/21 27.97 kg/m  07/08/21 28.24 kg/m   pts with limited exercise and diet controlled by group home      Relevant Orders   COMPLETE METABOLIC PANEL WITH GFR (Completed)   Lipid panel (Completed)   Dyslipidemia    Due for recheck of labs On pravastatin, no SE or concerns, good med compliance      Relevant Orders   COMPLETE METABOLIC PANEL  WITH GFR (Completed)   Lipid panel (Completed)   Lymphedema    Stable B/L LE edema Recent f/up with vascular reviewed New compression socks on On HCTZ      Other Visit Diagnoses     Hypercalcemia       Relevant Orders   COMPLETE METABOLIC PANEL WITH GFR (Completed)   Anxiety       behavioral concerns noted by caregiver, requests for psychiatry consult   Relevant Orders   Ambulatory referral to Psychiatry   Need for influenza vaccination       Relevant Orders   Flu Vaccine QUAD 47moIM (Fluarix, Fluzone & Alfiuria Quad PF) (Completed)   Postmenopausal estrogen deficiency       Relevant Orders   DG Bone Density   Need for pneumococcal vaccination       Relevant Orders   Pneumococcal conjugate vaccine 20-valent (Completed)   Tachycardia       ECG shows sinus tachycardia, pt otherwise asx, may have been from extra excitement with visit and multiple parties recently   Relevant Orders   CBC with Differential/Platelet   EKG 12-Lead   Encounter for medication monitoring       Relevant Orders   COMPLETE METABOLIC PANEL WITH GFR (Completed)   Lipid panel (Completed)   CBC with Differential/Platelet   CBC with Differential/Platelet (Completed)        Return in about 6 months (around 01/09/2023) for Annual Physical.   LDelsa Grana PA-C 07/10/22 9:29 AM

## 2022-07-10 NOTE — Telephone Encounter (Signed)
Requested medication (s) are due for refill today: yes  Requested medication (s) are on the active medication list:yes  Last refill:  03/09/22  Future visit scheduled: yes  Notes to clinic:  Unable to refill per protocol, not assigned to protocol, routing for approval.     Requested Prescriptions  Pending Prescriptions Disp Refills   Multiple Vitamins-Minerals (GNP ONE DAILY WOMENS 50+) TABS [Pharmacy Med Name: GNP ONE DAILY WOMENS 50+ TAB] 90 tablet 0    Sig: TAKE 1 TABLET BY MOUTH ONCE DAILY FOR SUPPLEMENT     There is no refill protocol information for this order     vitamin B-12 (CYANOCOBALAMIN) 100 MCG tablet [Pharmacy Med Name: VITAMIN B-12 100 MCG TAB] 30 tablet 3    Sig: TAKE 1 TABLET BY Nora     Endocrinology:  Vitamins - Vitamin B12 Failed - 07/07/2022  4:56 PM      Failed - B12 Level in normal range and within 360 days    No results found for: "VITAMINB12"       Passed - HCT in normal range and within 360 days    HCT  Date Value Ref Range Status  12/21/2021 44.8 35.0 - 45.0 % Final   Hematocrit  Date Value Ref Range Status  10/09/2016 42.5 34.0 - 46.6 % Final         Passed - HGB in normal range and within 360 days    Hemoglobin  Date Value Ref Range Status  12/21/2021 14.6 11.7 - 15.5 g/dL Final  10/09/2016 14.1 11.1 - 15.9 g/dL Final         Passed - Valid encounter within last 12 months    Recent Outpatient Visits           6 months ago Primary hypertension   [place], Dani Gobble, PA-C   6 months ago Adult general medical exam   [place], Dani Gobble, PA-C   1 year ago Essential hypertension   [facility] Bo Merino, FNP   1 year ago COVID-19   [facility] Myles Gip, DO   1 year ago Dyslipidemia   [facility] Delsa Grana, PA-C       Future Appointments             Today Delsa Grana, [facility], [place]   In 5 months Delsa Grana, PA-C [facility], [place] C 4200-20-3 MG-UNIT CAPS [Pharmacy Med Name: CRANBERRY PLUS VITAMIN C 4200-20-3] 30 capsule 3    Sig: TAKE 1 SOFTGEL BY MOUTH ONCE DAILY FOR URINARY HEALTH     There is no refill protocol information for this order

## 2022-07-10 NOTE — Patient Instructions (Addendum)
Immunization History  Administered Date(s) Administered   Influenza,inj,Quad PF,6+ Mos 08/02/2015, 07/12/2016, 06/28/2017, 06/17/2018, 06/09/2019, 07/07/2020, 07/08/2021, 07/10/2022   Moderna Covid Bivalent Peds Booster(54moThru 549yr 07/19/2021   Moderna Sars-Covid-2 Vaccination 10/15/2019, 11/13/2019, 07/19/2020   Pneumococcal Polysaccharide-23 06/23/2000, 07/24/2005   Td 06/20/2005   Tdap 12/13/2017   Zoster Recombinat (Shingrix) 07/23/2020, 12/21/2021   Zoster, Live 09/17/2013   Health Maintenance  Topic Date Due   COVID-19 Vaccine (5 - Moderna risk series) 07/26/2022*   DEXA scan (bone density measurement)  07/22/2022   Mammogram  12/09/2022   Medicare Annual Wellness Visit  01/25/2023   Tetanus Vaccine  12/14/2027   Colon Cancer Screening  11/23/2029   Flu Shot  Completed   Hepatitis C Screening: USPSTF Recommendation to screen - Ages 18-79 yo.  Completed   HIV Screening  Completed   Zoster (Shingles) Vaccine  Completed   HPV Vaccine  Aged Out   Cologuard (Stool DNA test)  Discontinued  *Topic was postponed. The date shown is not the original due date.   She could do the pneumoccocal 20 shot and be complete for that  Due for bone density follow up test   [facility] [id number], [place] [id number] phone #: [phone]

## 2022-07-11 ENCOUNTER — Encounter: Payer: Self-pay | Admitting: Family Medicine

## 2022-07-11 LAB — CBC WITH DIFFERENTIAL/PLATELET
Absolute Monocytes: 636 cells/uL (ref 200–950)
Basophils Absolute: 95 cells/uL (ref 0–200)
Basophils Relative: 1.1 %
Eosinophils Absolute: 95 cells/uL (ref 15–500)
Eosinophils Relative: 1.1 %
HCT: 43.9 % (ref 35.0–45.0)
Hemoglobin: 14.7 g/dL (ref 11.7–15.5)
Lymphs Abs: 3199 cells/uL (ref 850–3900)
MCH: 29.6 pg (ref 27.0–33.0)
MCHC: 33.5 g/dL (ref 32.0–36.0)
MCV: 88.3 fL (ref 80.0–100.0)
MPV: 9.1 fL (ref 7.5–12.5)
Monocytes Relative: 7.4 %
Neutro Abs: 4575 cells/uL (ref 1500–7800)
Neutrophils Relative %: 53.2 %
Platelets: 388 10*3/uL (ref 140–400)
RBC: 4.97 10*6/uL (ref 3.80–5.10)
RDW: 13.3 % (ref 11.0–15.0)
Total Lymphocyte: 37.2 %
WBC: 8.6 10*3/uL (ref 3.8–10.8)

## 2022-07-11 LAB — LIPID PANEL
Cholesterol: 164 mg/dL (ref <200)
HDL: 62 mg/dL (ref >=50)
LDL Cholesterol (Calc): 77 mg/dL (calc)
Non-HDL Cholesterol (Calc): 102 mg/dL (calc) (ref <130)
Total CHOL/HDL Ratio: 2.6 (calc) (ref <5.0)
Triglycerides: 151 mg/dL — ABNORMAL HIGH (ref <150)

## 2022-07-11 LAB — COMPLETE METABOLIC PANEL WITH GFR
AG Ratio: 1.6 (calc) (ref 1.0–2.5)
ALT: 12 U/L (ref 6–29)
AST: 14 U/L (ref 10–35)
Albumin: 4.2 g/dL (ref 3.6–5.1)
Alkaline phosphatase (APISO): 81 U/L (ref 37–153)
BUN: 15 mg/dL (ref 7–25)
CO2: 27 mmol/L (ref 20–32)
Calcium: 9.8 mg/dL (ref 8.6–10.4)
Chloride: 105 mmol/L (ref 98–110)
Creat: 0.82 mg/dL (ref 0.50–1.05)
Globulin: 2.6 g/dL (calc) (ref 1.9–3.7)
Glucose, Bld: 87 mg/dL (ref 65–99)
Potassium: 5 mmol/L (ref 3.5–5.3)
Sodium: 142 mmol/L (ref 135–146)
Total Bilirubin: 0.3 mg/dL (ref 0.2–1.2)
Total Protein: 6.8 g/dL (ref 6.1–8.1)
eGFR: 81 mL/min/{1.73_m2} (ref >=60)

## 2022-07-11 NOTE — Assessment & Plan Note (Signed)
Due for repeat dexa

## 2022-07-11 NOTE — Assessment & Plan Note (Signed)
Stable, well controlled and at goal today Continue lisinopril-hctz 10-12.5

## 2022-07-11 NOTE — Assessment & Plan Note (Signed)
Stable B/L LE edema Recent f/up with vascular reviewed New compression socks on On HCTZ

## 2022-07-11 NOTE — Assessment & Plan Note (Signed)
Due for recheck of labs On pravastatin, no SE or concerns, good med compliance

## 2022-07-11 NOTE — Assessment & Plan Note (Signed)
Weights stable Wt Readings from Last 5 Encounters:  07/10/22 138 lb 6.4 oz (62.8 kg)  03/07/22 137 lb (62.1 kg)  01/06/22 137 lb (62.1 kg)  12/21/21 138 lb 8 oz (62.8 kg)  07/08/21 139 lb 12.8 oz (63.4 kg)   BMI Readings from Last 5 Encounters:  07/10/22 27.03 kg/m  03/07/22 26.76 kg/m  01/06/22 27.67 kg/m  12/21/21 27.97 kg/m  07/08/21 28.24 kg/m   pts with limited exercise and diet controlled by group home

## 2022-07-17 ENCOUNTER — Other Ambulatory Visit: Payer: Self-pay

## 2022-07-17 DIAGNOSIS — F419 Anxiety disorder, unspecified: Secondary | ICD-10-CM

## 2022-08-14 ENCOUNTER — Other Ambulatory Visit: Payer: Self-pay

## 2022-08-14 DIAGNOSIS — Z1211 Encounter for screening for malignant neoplasm of colon: Secondary | ICD-10-CM

## 2022-08-17 ENCOUNTER — Telehealth: Payer: Self-pay | Admitting: Family Medicine

## 2022-08-17 NOTE — Telephone Encounter (Signed)
Copied from [place] [id number]. Topic: General - Inquiry >> Aug 17, 2022  8:18 AM Devoria Glassing wrote: Reason for CRM: Letta Median states they keep getting calls from [employer] to send something back to them, but pt did a colonoscopy last year.  Pt did not do a cologuard.  The message they get is where/when pt is going to drop off. Please advise, as this is also on pt's "active request"

## 2022-08-17 NOTE — Telephone Encounter (Signed)
Called Tonya Lawson back to let her know for some reason a cologuard was put in recently to ignore the cologuard messages/phone calls. Pt had a colonoscopy done and she is not due until 2031.

## 2022-09-21 ENCOUNTER — Ambulatory Visit: Payer: Self-pay

## 2022-09-21 ENCOUNTER — Ambulatory Visit: Payer: Medicare Other | Admitting: Internal Medicine

## 2022-09-21 NOTE — Telephone Encounter (Signed)
  Chief Complaint: URI Symptoms: nasal stuffiness and congestion, hoarseness Frequency: started few days ago this week  Pertinent Negatives: Patient denies fever or SOB, and cough  Disposition: '[]'$ ED /'[]'$ Urgent Care (no appt availability in office) / '[]'$ Appointment(In office/virtual)/ '[]'$  [facility]/ '[x]'$ Home Care/ '[]'$ Refused Recommended Disposition /'[]'$ [place] [facility]/ '[x]'$  Follow-up with PCP Additional Notes: spoke with Letta Median, pt's nurse in day program. She states that staff was asking for Zpac for pt d/t sx. Has been taking Robitussin but not seem a lot of improvement. Advised pt can take Claritin daily for several days as needed since on med list and not been taking. Also advised would send message back regarding Zpac but wasn't sure provider would prescribe based on sx. Recommended calling back if sx get worse.   Summary: cold symptoms and meds wanted   Pt has cols symptoms and ms walker called to see if provider can prescribe her a z pack/ please advise / no appts open until Monday     Reason for Disposition  Common cold with no complications  Answer Assessment - Initial Assessment Questions 1. ONSET: "When did the nasal discharge start?"      This week  3. COUGH: "Do you have a cough?" If Yes, ask: "Describe the color of your sputum" (clear, white, yellow, green)     Not a lot of cough  4. RESPIRATORY DISTRESS: "Describe your breathing."      no 5. FEVER: "Do you have a fever?" If Yes, ask: "What is your temperature, how was it measured, and when did it start?"     no 6. SEVERITY: "Overall, how bad are you feeling right now?" (e.g., doesn't interfere with normal activities, staying home from school/work, staying in bed)      NA 7. OTHER SYMPTOMS: "Do you have any other symptoms?" (e.g., sore throat, earache, wheezing, vomiting)     Nasal stuffiness and congestion, hoarseness  Protocols used: Common Cold-A-AH

## 2022-09-21 NOTE — Telephone Encounter (Signed)
Called and spoke to Mrs Gilford Rile and she will call back to schedule

## 2022-11-09 ENCOUNTER — Encounter: Payer: Self-pay | Admitting: Family Medicine

## 2022-11-09 ENCOUNTER — Ambulatory Visit (INDEPENDENT_AMBULATORY_CARE_PROVIDER_SITE_OTHER): Payer: 59 | Admitting: Family Medicine

## 2022-11-09 VITALS — BP 116/72 | HR 92 | Temp 98.0°F | Resp 16 | Ht 60.0 in | Wt 141.4 lb

## 2022-11-09 DIAGNOSIS — Z025 Encounter for examination for participation in sport: Secondary | ICD-10-CM

## 2022-11-09 DIAGNOSIS — M40204 Unspecified kyphosis, thoracic region: Secondary | ICD-10-CM | POA: Diagnosis not present

## 2022-11-09 DIAGNOSIS — R000 Tachycardia, unspecified: Secondary | ICD-10-CM

## 2022-11-09 DIAGNOSIS — M810 Age-related osteoporosis without current pathological fracture: Secondary | ICD-10-CM | POA: Diagnosis not present

## 2022-11-09 DIAGNOSIS — F79 Unspecified intellectual disabilities: Secondary | ICD-10-CM

## 2022-11-09 DIAGNOSIS — Z0289 Encounter for other administrative examinations: Secondary | ICD-10-CM | POA: Diagnosis not present

## 2022-11-09 NOTE — Progress Notes (Signed)
Patient ID: Tonya Lawson, female    DOB: 12/17/1960, 62 y.o.   MRN: [id number]  PCP: Delsa Grana, PA-C  Chief Complaint  Patient presents with   Form Completion    Unable to perform eye test, pt did not recognize letters    Subjective:   Tonya Lawson is a 62 y.o. 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 Due to developmental and intellectual disability some screening values were not able to be completed For all of exam and work done today please see scanned in forms  Patient Active Problem List   Diagnosis Date Noted   Lymphedema 08/24/2020   Swelling of limb 02/17/2020   Dyslipidemia 10/10/2018   Dental caries 10/10/2018   Overweight (BMI 25.0-29.9) 02/01/2018   DNR (do not resuscitate) 08/29/2017   Hypertension    Osteoporosis    Intellectual disability    Kyphosis of thoracic region       Current Outpatient Medications:    acetaminophen (TYLENOL) 500 MG tablet, Take 1 tablet (500 mg total) by mouth every 6 (six) hours as needed., Disp: 30 tablet, Rfl: 0   Calcium Carbonate-Vitamin D 600-400 MG-UNIT tablet, Take 1 tablet by mouth 2 (two) times daily., Disp: 60 tablet, Rfl: 11   CRANBERRY PLUS VITAMIN C 4200-20-3 MG-UNIT CAPS, TAKE 1 SOFTGEL BY MOUTH ONCE DAILY FOR URINARY HEALTH, Disp: 30 capsule, Rfl: 11   hydrOXYzine (VISTARIL) 25 MG capsule, TAKE 1 CAPSULET BY MOUTH DAILY EVERY 8 HOURS AS NEEDED FOR ANXIETY, CAN BE USEDBEFORE GOING TO DENTIST., Disp: 30 capsule, Rfl: 11   lisinopril-hydrochlorothiazide (ZESTORETIC) 10-12.5 MG tablet, TAKE 1/2 TABLET BY MOUTH DAILY, Disp: 30 tablet, Rfl: 11   loratadine (CLARITIN) 10 MG tablet, Take 1 tablet (10 mg total) by mouth daily as needed for allergies., Disp: 90 tablet, Rfl: 3   Multiple Vitamins-Minerals (GNP ONE DAILY WOMENS 50+) TABS, TAKE 1 TABLET BY MOUTH ONCE DAILY FOR SUPPLEMENT, Disp: 30  tablet, Rfl: 11   pravastatin (PRAVACHOL) 20 MG tablet, TAKE 1 TABLET BY MOUTH DAILY, Disp: 31 tablet, Rfl: 11   vitamin B-12 (CYANOCOBALAMIN) 100 MCG tablet, TAKE 1 TABLET BY MOUTH ONCE DAILY FOR SUPPLEMENT, Disp: 30 tablet, Rfl: 11   No Known Allergies   Social History   Tobacco Use   Smoking status: Never   Smokeless tobacco: Never  Vaping Use   Vaping Use: Never used  Substance Use Topics   Alcohol use: No   Drug use: No      Chart Review Today: I personally reviewed active problem list, medication list, allergies, family history, social history, health maintenance, notes from last encounter, lab results, imaging with the patient/caregiver today.   Review of Systems  Constitutional: Negative.   HENT: Negative.    Eyes: Negative.   Respiratory: Negative.    Cardiovascular: Negative.   Gastrointestinal: Negative.   Endocrine: Negative.   Genitourinary: Negative.   Musculoskeletal: Negative.   Skin: Negative.   Allergic/Immunologic: Negative.   Neurological: Negative.   Hematological: Negative.   Psychiatric/Behavioral: Negative.    All other systems reviewed and are negative.      Objective:   Vitals:   11/09/22 1016  BP: 116/72  Pulse: 100  Resp: 16  Temp: 98 F (36.7 C)  TempSrc: Oral  SpO2: 94%  Weight: 141 lb 6.4 oz (64.1 kg)  Height: 5' (1.524 m)  Body mass index is 27.62 kg/m.  Physical Exam Vitals and nursing note reviewed.  Constitutional:      General: She is not in acute distress.    Appearance: Normal appearance. She is well-developed. She is obese. She is not ill-appearing, toxic-appearing or diaphoretic.  HENT:     Head: Normocephalic and atraumatic.     Right Ear: External ear normal.     Left Ear: External ear normal.     Nose: Nose normal.     Mouth/Throat:     Pharynx: Oropharynx is clear.  Eyes:     General: No scleral icterus.       Right eye: No discharge.        Left eye: No discharge.     Conjunctiva/sclera:  Conjunctivae normal.  Neck:     Trachea: No tracheal deviation.  Cardiovascular:     Rate and Rhythm: Normal rate and regular rhythm.     Pulses: Normal pulses.     Heart sounds: Normal heart sounds. No murmur heard.    No friction rub. No gallop.     Comments: Wearing compression stocking b/l Pulmonary:     Effort: Pulmonary effort is normal. No respiratory distress.     Breath sounds: Normal breath sounds. No stridor.  Abdominal:     General: Bowel sounds are normal.     Palpations: Abdomen is soft.  Musculoskeletal:     Right lower leg: Edema present.     Left lower leg: Edema present.     Comments: See ortho eval on forms  Skin:    General: Skin is warm and dry.     Findings: No rash.  Neurological:     Mental Status: She is alert. Mental status is at baseline.     Motor: No abnormal muscle tone.     Coordination: Coordination normal.     Gait: Gait normal.  Psychiatric:        Mood and Affect: Mood normal.        Behavior: Behavior normal.      Results for orders placed or performed in visit on 07/10/22  COMPLETE METABOLIC PANEL WITH GFR  Result Value Ref Range   Glucose, Bld 87 65 - 99 mg/dL   BUN 15 7 - 25 mg/dL   Creat 0.82 0.50 - 1.05 mg/dL   eGFR 81 > OR = 60 mL/min/1.77m   BUN/Creatinine Ratio SEE NOTE: 6 - 22 (calc)   Sodium 142 135 - 146 mmol/L   Potassium 5.0 3.5 - 5.3 mmol/L   Chloride 105 98 - 110 mmol/L   CO2 27 20 - 32 mmol/L   Calcium 9.8 8.6 - 10.4 mg/dL   Total Protein 6.8 6.1 - 8.1 g/dL   Albumin 4.2 3.6 - 5.1 g/dL   Globulin 2.6 1.9 - 3.7 g/dL (calc)   AG Ratio 1.6 1.0 - 2.5 (calc)   Total Bilirubin 0.3 0.2 - 1.2 mg/dL   Alkaline phosphatase (APISO) 81 37 - 153 U/L   AST 14 10 - 35 U/L   ALT 12 6 - 29 U/L  Lipid panel  Result Value Ref Range   Cholesterol 164 <200 mg/dL   HDL 62 > OR = 50 mg/dL   Triglycerides 151 (H) <150 mg/dL   LDL Cholesterol (Calc) 77 mg/dL (calc)   Total CHOL/HDL Ratio 2.6 <5.0 (calc)   Non-HDL Cholesterol  (Calc) 102 <130 mg/dL (calc)  CBC with Differential/Platelet  Result Value Ref Range   WBC 8.6 3.8 - 10.8  Thousand/uL   RBC 4.97 3.80 - 5.10 Million/uL   Hemoglobin 14.7 11.7 - 15.5 g/dL   HCT 43.9 35.0 - 45.0 %   MCV 88.3 80.0 - 100.0 fL   MCH 29.6 27.0 - 33.0 pg   MCHC 33.5 32.0 - 36.0 g/dL   RDW 13.3 11.0 - 15.0 %   Platelets 388 140 - 400 Thousand/uL   MPV 9.1 7.5 - 12.5 fL   Neutro Abs 4,575 1,500 - 7,800 cells/uL   Lymphs Abs 3,199 850 - 3,900 cells/uL   Absolute Monocytes 636 200 - 950 cells/uL   Eosinophils Absolute 95 15 - 500 cells/uL   Basophils Absolute 95 0 - 200 cells/uL   Neutrophils Relative % 53.2 %   Total Lymphocyte 37.2 %   Monocytes Relative 7.4 %   Eosinophils Relative 1.1 %   Basophils Relative 1.1 %       Assessment & Plan:    Pt presents with caregiver from adult group home and also is with her sister, presents for form completion and complete exam for special olympics - pt has participated in this several times, not recently due to [place]   1. Encounter for examination for participation in sport  Z02.5    see scanned in exam forms and all the associated completed documentation done today which took more than 30 min to complete with patient    2. Encounter for completion of form with patient  Z02.89    see scanned in exam forms and all the associated completed documentation done today    3. Intellectual disability  F79    some screening not feasable    4. Osteoporosis without current pathological fracture, unspecified osteoporosis type  M81.0    should avoid any activity in special olympics that puts her at risk for falls    5. Kyphosis of thoracic region, unspecified kyphosis type  M40.204    no heavy lifting    6. Tachycardia  R00.0    pt often mildly tachy when here - does improve after time in exam room, rechecked today      Keep next routine appt for CPE and FL2 paperwork    Delsa Grana, PA-C 11/09/22 10:39 AM

## 2022-11-09 NOTE — Addendum Note (Signed)
Addended by: Delsa Grana on: 11/09/2022 11:06 AM   Modules accepted: Orders

## 2022-11-14 ENCOUNTER — Other Ambulatory Visit: Payer: Self-pay | Admitting: Family Medicine

## 2022-11-14 DIAGNOSIS — Z1231 Encounter for screening mammogram for malignant neoplasm of breast: Secondary | ICD-10-CM

## 2022-12-12 ENCOUNTER — Ambulatory Visit
Admission: RE | Admit: 2022-12-12 | Discharge: 2022-12-12 | Disposition: A | Discharge status: Home / Self Care | Payer: 59 | Source: Ambulatory Visit | Attending: Family Medicine | Admitting: Family Medicine

## 2022-12-12 DIAGNOSIS — Z1231 Encounter for screening mammogram for malignant neoplasm of breast: Secondary | ICD-10-CM | POA: Diagnosis not present

## 2022-12-25 ENCOUNTER — Encounter: Payer: Medicare Other | Admitting: Family Medicine

## 2023-01-05 NOTE — Patient Instructions (Incomplete)
Preventive Care 40-62 Years Old, Female Preventive care refers to lifestyle choices and visits with your health care provider that can promote health and wellness. Preventive care visits are also called wellness exams. What can I expect for my preventive care visit? Counseling Your health care provider may ask you questions about your: Medical history, including: Past medical problems. Family medical history. Pregnancy history. Current health, including: Menstrual cycle. Method of birth control. Emotional well-being. Home life and relationship well-being. Sexual activity and sexual health. Lifestyle, including: Alcohol, nicotine or tobacco, and drug use. Access to firearms. Diet, exercise, and sleep habits. Work and work environment. Sunscreen use. Safety issues such as seatbelt and bike helmet use. Physical exam Your health care provider will check your: Height and weight. These may be used to calculate your BMI (body mass index). BMI is a measurement that tells if you are at a healthy weight. Waist circumference. This measures the distance around your waistline. This measurement also tells if you are at a healthy weight and may help predict your risk of certain diseases, such as type 2 diabetes and high blood pressure. Heart rate and blood pressure. Body temperature. Skin for abnormal spots. What immunizations do I need?  Vaccines are usually given at various ages, according to a schedule. Your health care provider will recommend vaccines for you based on your age, medical history, and lifestyle or other factors, such as travel or where you work. What tests do I need? Screening Your health care provider may recommend screening tests for certain conditions. This may include: Lipid and cholesterol levels. Diabetes screening. This is done by checking your blood sugar (glucose) after you have not eaten for a while (fasting). Pelvic exam and Pap test. Hepatitis B test. Hepatitis C  test. HIV (human immunodeficiency virus) test. STI (sexually transmitted infection) testing, if you are at risk. Lung cancer screening. Colorectal cancer screening. Mammogram. Talk with your health care provider about when you should start having regular mammograms. This may depend on whether you have a family history of breast cancer. BRCA-related cancer screening. This may be done if you have a family history of breast, ovarian, tubal, or peritoneal cancers. Bone density scan. This is done to screen for osteoporosis. Talk with your health care provider about your test results, treatment options, and if necessary, the need for more tests. Follow these instructions at home: Eating and drinking  Eat a diet that includes fresh fruits and vegetables, whole grains, lean protein, and low-fat dairy products. Take vitamin and mineral supplements as recommended by your health care provider. Do not drink alcohol if: Your health care provider tells you not to drink. You are pregnant, may be pregnant, or are planning to become pregnant. If you drink alcohol: Limit how much you have to 0-1 drink a day. Know how much alcohol is in your drink. In the [place] one drink equals one 12 oz bottle of beer (355 mL), one 5 oz glass of wine (148 mL), or one 1 oz glass of hard liquor (44 mL). Lifestyle Brush your teeth every morning and night with fluoride toothpaste. Floss one time each day. Exercise for at least 30 minutes 5 or more days each week. Do not use any products that contain nicotine or tobacco. These products include cigarettes, chewing tobacco, and vaping devices, such as e-cigarettes. If you need help quitting, ask your health care provider. Do not use drugs. If you are sexually active, practice safe sex. Use a condom or other form of protection to   prevent STIs. If you do not wish to become pregnant, use a form of birth control. If you plan to become pregnant, see your health care provider for a  prepregnancy visit. Take aspirin only as told by your health care provider. Make sure that you understand how much to take and what form to take. Work with your health care provider to find out whether it is safe and beneficial for you to take aspirin daily. Find healthy ways to manage stress, such as: Meditation, yoga, or listening to music. Journaling. Talking to a trusted person. Spending time with friends and family. Minimize exposure to UV radiation to reduce your risk of skin cancer. Safety Always wear your seat belt while driving or riding in a vehicle. Do not drive: If you have been drinking alcohol. Do not ride with someone who has been drinking. When you are tired or distracted. While texting. If you have been using any mind-altering substances or drugs. Wear a helmet and other protective equipment during sports activities. If you have firearms in your house, make sure you follow all gun safety procedures. Seek help if you have been physically or sexually abused. What's next? Visit your health care provider once a year for an annual wellness visit. Ask your health care provider how often you should have your eyes and teeth checked. Stay up to date on all vaccines. This information is not intended to replace advice given to you by your health care provider. Make sure you discuss any questions you have with your health care provider. Document Revised: 02/23/2021 Document Reviewed: 02/23/2021 [employer] Patient Education  2023 [employer]  

## 2023-01-08 ENCOUNTER — Encounter: Payer: Self-pay | Admitting: Family Medicine

## 2023-01-08 ENCOUNTER — Ambulatory Visit (INDEPENDENT_AMBULATORY_CARE_PROVIDER_SITE_OTHER): Payer: 59 | Admitting: Family Medicine

## 2023-01-08 VITALS — BP 122/74 | HR 98 | Temp 98.3°F | Resp 16 | Ht 60.0 in | Wt 141.1 lb

## 2023-01-08 DIAGNOSIS — I10 Essential (primary) hypertension: Secondary | ICD-10-CM

## 2023-01-08 DIAGNOSIS — E663 Overweight: Secondary | ICD-10-CM | POA: Diagnosis not present

## 2023-01-08 DIAGNOSIS — Z0000 Encounter for general adult medical examination without abnormal findings: Secondary | ICD-10-CM | POA: Diagnosis not present

## 2023-01-08 DIAGNOSIS — I890 Lymphedema, not elsewhere classified: Secondary | ICD-10-CM | POA: Diagnosis not present

## 2023-01-08 DIAGNOSIS — M810 Age-related osteoporosis without current pathological fracture: Secondary | ICD-10-CM | POA: Diagnosis not present

## 2023-01-08 DIAGNOSIS — E785 Hyperlipidemia, unspecified: Secondary | ICD-10-CM

## 2023-01-08 NOTE — Progress Notes (Signed)
Patient: Tonya Lawson, Female    DOB: 04-03-1961, 62 y.o.   MRN: [id number] Danelle Berry, PA-C Visit Date: 01/08/2023  Today's Provider: Danelle Berry, PA-C   Chief Complaint  Patient presents with   Annual Exam   Subjective:   Annual physical exam:  Tonya Lawson is a 62 y.o. female who presents today for complete physical exam:  Exercise/Activity:  adult group home plan for 15 min of walking daily Diet/nutrition:  healthy per facility (see paperwork scanned in for daily care and plan) Sleep: no concers    SDOH Screenings   Food Insecurity: No Food Insecurity (01/24/2022)  Housing: Low Risk  (01/24/2022)  Transportation Needs: No Transportation Needs (01/24/2022)  Alcohol Screen: Low Risk  (01/24/2022)  Depression (PHQ2-9): Low Risk  (11/09/2022)  Financial Resource Strain: Low Risk  (01/24/2022)  Physical Activity: Inactive (01/24/2022)  Social Connections: Socially Isolated (01/24/2022)  Stress: No Stress Concern Present (01/24/2022)  Tobacco Use: Low Risk  (12/12/2022)    USPSTF grade A and B recommendations - reviewed and addressed today  Depression:  Phq 9 completed today by patient, was reviewed by me with patient in the room PHQ score is neg, pt feels good    01/08/2023    8:55 AM 11/09/2022   10:16 AM 07/10/2022    8:55 AM 01/24/2022   12:29 PM  PHQ 2/9 Scores  PHQ - 2 Score 0 0 0 0  PHQ- 9 Score 0 0 0       01/08/2023    8:55 AM 11/09/2022   10:16 AM 07/10/2022    8:55 AM 01/24/2022   12:29 PM 01/06/2022   10:16 AM  Depression screen PHQ 2/9  Decreased Interest 0 0 0 0 0  Down, Depressed, Hopeless 0 0 0 0 0  PHQ - 2 Score 0 0 0 0 0  Altered sleeping 0 0 0  0  Tired, decreased energy 0 0 0  0  Change in appetite 0 0 0  0  Feeling bad or failure about yourself  0 0 0  0  Trouble concentrating 0 0 0  0  Moving slowly or fidgety/restless 0 0 0  0  Suicidal thoughts 0 0 0  0  PHQ-9 Score 0 0 0  0  Difficult doing work/chores  Not difficult at all Not  difficult at all      Alcohol screening: Flowsheet Row Clinical Support from 01/24/2022 in [facility]  AUDIT-C Score 0       Immunizations and Health Maintenance: Health Maintenance  Topic Date Due   DEXA SCAN  07/22/2022   Medicare Annual Wellness (AWV)  01/25/2023   COVID-19 Vaccine (5 - 2023-24 season) 01/24/2023 (Originally 05/12/2022)   INFLUENZA VACCINE  04/12/2023   MAMMOGRAM  12/12/2023   DTaP/Tdap/Td (3 - Td or Tdap) 12/14/2027   COLONOSCOPY (Pts 45-45yrs Insurance coverage will need to be confirmed)  11/23/2029   Hepatitis C Screening  Completed   HIV Screening  Completed   Zoster Vaccines- Shingrix  Completed   HPV VACCINES  Aged Out   Fecal DNA (Cologuard)  Discontinued     Hep C Screening: done  STD testing and prevention (HIV/chl/gon/syphilis): done previously, no need to repeat today no known exposure  Intimate partner violence:none  Sexual History/Pain during Intercourse: Single- adult group home  Menstrual History/LMP/Abnormal Bleeding: none No LMP recorded. Patient has had a hysterectomy.  Incontinence Symptoms: none  Breast cancer: utd on mammograms  Cervical cancer  screening: not indicated/hysterectomy  Osteoporosis:   Discussion on osteoporosis per age, including high calcium and vitamin D supplementation, weight bearing exercises Hx of osteoporosis, overdue for dexa On calcium and vit d  Skin cancer:  Hx of skin CA -  NO Discussed atypical lesions   Colorectal cancer:   Colonoscopy is UTD   Discussed concerning signs and sx of CRC, pt denies blood in stool concerns with BM  Lung cancer:   Low Dose CT Chest recommended if Age 49-80 years, 20 pack-year currently smoking OR have quit w/in 15years. Patient does not qualify.    Social History   Tobacco Use   Smoking status: Never   Smokeless tobacco: Never  Vaping Use   Vaping Use: Never used  Substance Use Topics   Alcohol use: No   Drug use: No      Flowsheet Row Clinical Support from 01/24/2022 in [facility]  AUDIT-C Score 0       Family History  Problem Relation Age of Onset   Diabetes Mother    Stroke Mother    Cancer Father        brain   Breast cancer Maternal Aunt 62   Cancer Brother      Blood pressure/Hypertension: BP Readings from Last 3 Encounters:  01/08/23 122/74  11/09/22 116/72  07/10/22 116/74    Weight/Obesity: Wt Readings from Last 3 Encounters:  01/08/23 141 lb 1.6 oz (64 kg)  11/09/22 141 lb 6.4 oz (64.1 kg)  07/10/22 138 lb 6.4 oz (62.8 kg)   BMI Readings from Last 3 Encounters:  01/08/23 27.56 kg/m  11/09/22 27.62 kg/m  07/10/22 27.03 kg/m     Lipids:  Lab Results  Component Value Date   CHOL 164 07/10/2022   CHOL 166 07/08/2021   CHOL 170 01/06/2021   Lab Results  Component Value Date   HDL 62 07/10/2022   HDL 64 07/08/2021   HDL 61 01/06/2021   Lab Results  Component Value Date   LDLCALC 77 07/10/2022   LDLCALC 79 07/08/2021   LDLCALC 86 01/06/2021   Lab Results  Component Value Date   TRIG 151 (H) 07/10/2022   TRIG 135 07/08/2021   TRIG 131 01/06/2021   Lab Results  Component Value Date   CHOLHDL 2.6 07/10/2022   CHOLHDL 2.6 07/08/2021   CHOLHDL 2.8 01/06/2021   No results found for: "LDLDIRECT" Based on the results of lipid panel his/her cardiovascular risk factor ( using [place] Cohort )  in the next 10 years is: The ASCVD Risk score (Arnett DK, et al., 2019) failed to calculate for the following reasons:   Unable to determine if patient is Non-Hispanic African American  Glucose:  Glucose, Bld  Date Value Ref Range Status  07/10/2022 87 65 - 99 mg/dL Final    Comment:    .            Fasting reference interval .   12/21/2021 79 65 - 99 mg/dL Final    Comment:    .            Fasting reference interval .   07/08/2021 85 65 - 99 mg/dL Final    Comment:    .            Fasting reference interval .     Advanced Care  Planning:  A voluntary discussion about advance care planning including the explanation and discussion of advance directives.   Guardian MPOA  Social History  Social History   Socioeconomic History   Marital status: Single    Spouse name: Not on file   Number of children: 0   Years of education: Not on file   Highest education level: Not on file  Occupational History   Not on file  Tobacco Use   Smoking status: Never   Smokeless tobacco: Never  Vaping Use   Vaping Use: Never used  Substance and Sexual Activity   Alcohol use: No   Drug use: No   Sexual activity: Never  Other Topics Concern   Not on file  Social History Narrative   Patient is in a care home with [facility].    Social Determinants of Health   Financial Resource Strain: Low Risk  (01/24/2022)   Overall Financial Resource Strain (CARDIA)    Difficulty of Paying Living Expenses: Not hard at all  Food Insecurity: No Food Insecurity (01/24/2022)   Hunger Vital Sign    Worried About Running Out of Food in the Last Year: Never true    Ran Out of Food in the Last Year: Never true  Transportation Needs: No Transportation Needs (01/24/2022)   PRAPARE - Administrator, Civil Service (Medical): No    Lack of Transportation (Non-Medical): No  Physical Activity: Inactive (01/24/2022)   Exercise Vital Sign    Days of Exercise per Week: 0 days    Minutes of Exercise per Session: 0 min  Stress: No Stress Concern Present (01/24/2022)   [employer] of Occupational Health - Occupational Stress Questionnaire    Feeling of Stress : Not at all  Social Connections: Socially Isolated (01/24/2022)   Social Connection and Isolation Panel [NHANES]    Frequency of Communication with Friends and Family: More than three times a week    Frequency of Social Gatherings with Friends and Family: More than three times a week    Attends Religious Services: Never    Database administrator or Organizations: No     Attends Engineer, structural: Never    Marital Status: Never married    Family History        Family History  Problem Relation Age of Onset   Diabetes Mother    Stroke Mother    Cancer Father        brain   Breast cancer Maternal Aunt 30   Cancer Brother     Patient Active Problem List   Diagnosis Date Noted   Lymphedema 08/24/2020   Swelling of limb 02/17/2020   Dyslipidemia 10/10/2018   Dental caries 10/10/2018   Overweight (BMI 25.0-29.9) 02/01/2018   DNR (do not resuscitate) 08/29/2017   Hypertension    Osteoporosis    Intellectual disability    Kyphosis of thoracic region     Past Surgical History:  Procedure Laterality Date   ABDOMINAL HYSTERECTOMY     completed   COLONOSCOPY WITH PROPOFOL N/A 11/24/2019   Procedure: COLONOSCOPY WITH PROPOFOL;  Surgeon: Wyline Mood, MD;  Location: [facility] ENDOSCOPY;  Service: Gastroenterology;  Laterality: N/A;     Current Outpatient Medications:    acetaminophen (TYLENOL) 500 MG tablet, Take 1 tablet (500 mg total) by mouth every 6 (six) hours as needed., Disp: 30 tablet, Rfl: 0   Calcium Carbonate-Vitamin D 600-400 MG-UNIT tablet, Take 1 tablet by mouth 2 (two) times daily., Disp: 60 tablet, Rfl: 11   hydrOXYzine (VISTARIL) 25 MG capsule, TAKE 1 CAPSULET BY MOUTH DAILY EVERY 8 HOURS AS NEEDED FOR ANXIETY, CAN BE  USEDBEFORE GOING TO DENTIST., Disp: 30 capsule, Rfl: 11   lisinopril-hydrochlorothiazide (ZESTORETIC) 10-12.5 MG tablet, TAKE 1/2 TABLET BY MOUTH DAILY, Disp: 30 tablet, Rfl: 11   loratadine (CLARITIN) 10 MG tablet, Take 1 tablet (10 mg total) by mouth daily as needed for allergies., Disp: 90 tablet, Rfl: 3   Multiple Vitamins-Minerals (GNP ONE DAILY WOMENS 50+) TABS, TAKE 1 TABLET BY MOUTH ONCE DAILY FOR SUPPLEMENT, Disp: 30 tablet, Rfl: 11   pravastatin (PRAVACHOL) 20 MG tablet, TAKE 1 TABLET BY MOUTH DAILY, Disp: 31 tablet, Rfl: 11   vitamin B-12 (CYANOCOBALAMIN) 100 MCG tablet, TAKE 1 TABLET BY MOUTH ONCE DAILY  FOR SUPPLEMENT, Disp: 30 tablet, Rfl: 11  No Known Allergies  Patient Care Team: Danelle Berry, PA-C as PCP - General (Family Medicine)   Chart Review: I personally reviewed active problem list, medication list, allergies, family history, social history, health maintenance, notes from last encounter, lab results, imaging with the patient/caregiver today.   Review of Systems  Constitutional: Negative.   HENT: Negative.    Eyes: Negative.   Respiratory: Negative.    Cardiovascular: Negative.   Gastrointestinal: Negative.   Endocrine: Negative.   Genitourinary: Negative.   Musculoskeletal: Negative.   Skin: Negative.   Allergic/Immunologic: Negative.   Neurological: Negative.   Hematological: Negative.   Psychiatric/Behavioral: Negative.    All other systems reviewed and are negative.         Objective:   Vitals:  Vitals:   01/08/23 0855  BP: 122/74  Pulse: 98  Resp: 16  Temp: 98.3 F (36.8 C)  SpO2: 99%  Weight: 141 lb 1.6 oz (64 kg)  Height: 5' (1.524 m)    Body mass index is 27.56 kg/m.  Physical Exam Vitals and nursing note reviewed.  Constitutional:      General: She is not in acute distress.    Appearance: Normal appearance. She is well-developed and overweight. She is not ill-appearing, toxic-appearing or diaphoretic.  HENT:     Head: Normocephalic and atraumatic.     Right Ear: External ear normal.     Left Ear: External ear normal.     Nose: Nose normal.     Mouth/Throat:     Pharynx: Oropharynx is clear.  Eyes:     General: No scleral icterus.       Right eye: No discharge.        Left eye: No discharge.     Conjunctiva/sclera: Conjunctivae normal.  Neck:     Trachea: No tracheal deviation.  Cardiovascular:     Rate and Rhythm: Normal rate and regular rhythm.     Pulses: Normal pulses.     Heart sounds: Normal heart sounds. No murmur heard.    No friction rub. No gallop.     Comments: Wearing compression stocking b/l Pulmonary:      Effort: Pulmonary effort is normal. No respiratory distress.     Breath sounds: Normal breath sounds. No stridor.  Abdominal:     General: Bowel sounds are normal.     Palpations: Abdomen is soft.  Musculoskeletal:     Right lower leg: Edema present.     Left lower leg: Edema present.  Skin:    General: Skin is warm and dry.     Findings: No rash.  Neurological:     Mental Status: She is alert. Mental status is at baseline.     Motor: No abnormal muscle tone.     Coordination: Coordination normal.     Gait: Gait  normal.  Psychiatric:        Mood and Affect: Mood normal.        Behavior: Behavior normal. Behavior is cooperative.       Fall Risk:    01/08/2023    8:55 AM 11/09/2022   10:15 AM 07/10/2022    8:55 AM 01/24/2022   12:30 PM 01/06/2022   10:16 AM  Fall Risk   Falls in the past year? 0 0 0 0 0  Number falls in past yr: 0 0 0 0 0  Injury with Fall? 0 0 0 0 0  Risk for fall due to :  No Fall Risks No Fall Risks No Fall Risks No Fall Risks  Follow up  Falls prevention discussed;Education provided;Falls evaluation completed Falls prevention discussed;Education provided;Falls evaluation completed Falls prevention discussed Falls prevention discussed    Functional Status Survey: Is the patient deaf or have difficulty hearing?: No Does the patient have difficulty seeing, even when wearing glasses/contacts?: No Does the patient have difficulty concentrating, remembering, or making decisions?: Yes Does the patient have difficulty walking or climbing stairs?: No Does the patient have difficulty dressing or bathing?: No Does the patient have difficulty doing errands alone such as visiting a doctor's office or shopping?: Yes   Assessment & Plan:    CPE completed today  USPSTF grade A and B recommendations reviewed with patient; age-appropriate recommendations, preventive care, screening tests, etc discussed and encouraged; healthy living encouraged; see AVS for patient  education given to patient  Discussed importance of 150 minutes of physical activity weekly, AHA exercise recommendations given to pt in AVS/handout  Discussed importance of healthy diet:  eating lean meats and proteins, avoiding trans fats and saturated fats, avoid simple sugars and excessive carbs in diet, eat 6 servings of fruit/vegetables daily and drink plenty of water and avoid sweet beverages.    Recommended pt to do annual eye exam and routine dental exams/cleanings  Depression, alcohol, fall screening completed as documented above and per flowsheets  Advance Care planning information and packet discussed and offered today, encouraged pt to discuss with family members/spouse/partner/friends and complete Advanced directive packet and bring copy to office   Reviewed Health Maintenance: Health Maintenance  Topic Date Due   DEXA SCAN  07/22/2022   Medicare Annual Wellness (AWV)  01/25/2023   COVID-19 Vaccine (5 - 2023-24 season) 01/24/2023 (Originally 05/12/2022)   INFLUENZA VACCINE  04/12/2023   MAMMOGRAM  12/12/2023   DTaP/Tdap/Td (3 - Td or Tdap) 12/14/2027   COLONOSCOPY (Pts 45-43yrs Insurance coverage will need to be confirmed)  11/23/2029   Hepatitis C Screening  Completed   HIV Screening  Completed   Zoster Vaccines- Shingrix  Completed   HPV VACCINES  Aged Out   Fecal DNA (Cologuard)  Discontinued    Immunizations: Immunization History  Administered Date(s) Administered   Influenza,inj,Quad PF,6+ Mos 08/02/2015, 07/12/2016, 06/28/2017, 06/17/2018, 06/09/2019, 07/07/2020, 07/08/2021, 07/10/2022   Moderna Covid Bivalent Peds Booster(52mo Thru 75yrs) 07/19/2021   Moderna Sars-Covid-2 Vaccination 10/15/2019, 11/13/2019, 07/19/2020   PNEUMOCOCCAL CONJUGATE-20 07/10/2022   Pneumococcal Polysaccharide-23 06/23/2000, 07/24/2005   Td 06/20/2005   Tdap 12/13/2017   Zoster Recombinat (Shingrix) 07/23/2020, 12/21/2021   Zoster, Live 09/17/2013   Vaccines:  See above - reviewed  all with pt and caregiver today, all UTD     ICD-10-CM   1. Annual physical exam  Z00.00 Lipid panel    CBC with Differential/Platelet    COMPLETE METABOLIC PANEL WITH GFR    Vitamin B12  Hemoglobin A1c    2. Osteoporosis without current pathological fracture, unspecified osteoporosis type  M81.0 COMPLETE METABOLIC PANEL WITH GFR    Vitamin D 1,25 dihydroxy   overdue for dexa, labs ordered    3. Overweight (BMI 25.0-29.9)  E66.3    weight stable    4. Dyslipidemia  E78.5 Lipid panel    COMPLETE METABOLIC PANEL WITH GFR   due for labs    5. Lymphedema  I89.0    stable    6. Primary hypertension  I10 COMPLETE METABOLIC PANEL WITH GFR   BP at goal today      Return in about 6 months (around 07/10/2023) for HTN, HLD, med refills.     Danelle Berry, PA-C 01/08/23 10:08 AM  [facility]

## 2023-01-09 ENCOUNTER — Other Ambulatory Visit: Payer: Self-pay | Admitting: Family Medicine

## 2023-01-09 DIAGNOSIS — E785 Hyperlipidemia, unspecified: Secondary | ICD-10-CM

## 2023-01-11 LAB — CBC WITH DIFFERENTIAL/PLATELET
Absolute Monocytes: 561 cells/uL (ref 200–950)
Basophils Absolute: 83 cells/uL (ref 0–200)
Basophils Relative: 0.9 %
Eosinophils Absolute: 110 cells/uL (ref 15–500)
Eosinophils Relative: 1.2 %
HCT: 40.8 % (ref 35.0–45.0)
Hemoglobin: 13.5 g/dL (ref 11.7–15.5)
Lymphs Abs: 3468 cells/uL (ref 850–3900)
MCH: 28.7 pg (ref 27.0–33.0)
MCHC: 33.1 g/dL (ref 32.0–36.0)
MCV: 86.6 fL (ref 80.0–100.0)
MPV: 9.4 fL (ref 7.5–12.5)
Monocytes Relative: 6.1 %
Neutro Abs: 4977 cells/uL (ref 1500–7800)
Neutrophils Relative %: 54.1 %
Platelets: 386 10*3/uL (ref 140–400)
RBC: 4.71 10*6/uL (ref 3.80–5.10)
RDW: 13.4 % (ref 11.0–15.0)
Total Lymphocyte: 37.7 %
WBC: 9.2 10*3/uL (ref 3.8–10.8)

## 2023-01-11 LAB — COMPLETE METABOLIC PANEL WITH GFR
AG Ratio: 1.9 (calc) (ref 1.0–2.5)
ALT: 15 U/L (ref 6–29)
AST: 17 U/L (ref 10–35)
Albumin: 4.2 g/dL (ref 3.6–5.1)
Alkaline phosphatase (APISO): 78 U/L (ref 37–153)
BUN: 15 mg/dL (ref 7–25)
CO2: 27 mmol/L (ref 20–32)
Calcium: 9.1 mg/dL (ref 8.6–10.4)
Chloride: 104 mmol/L (ref 98–110)
Creat: 0.72 mg/dL (ref 0.50–1.05)
Globulin: 2.2 g/dL (calc) (ref 1.9–3.7)
Glucose, Bld: 84 mg/dL (ref 65–99)
Potassium: 4.2 mmol/L (ref 3.5–5.3)
Sodium: 137 mmol/L (ref 135–146)
Total Bilirubin: 0.4 mg/dL (ref 0.2–1.2)
Total Protein: 6.4 g/dL (ref 6.1–8.1)
eGFR: 95 mL/min/{1.73_m2} (ref >=60)

## 2023-01-11 LAB — LIPID PANEL
Cholesterol: 146 mg/dL (ref <200)
HDL: 67 mg/dL (ref >=50)
LDL Cholesterol (Calc): 60 mg/dL (calc)
Non-HDL Cholesterol (Calc): 79 mg/dL (calc) (ref <130)
Total CHOL/HDL Ratio: 2.2 (calc) (ref <5.0)
Triglycerides: 107 mg/dL (ref <150)

## 2023-01-11 LAB — HEMOGLOBIN A1C
Hgb A1c MFr Bld: 5.4 % of total Hgb (ref <5.7)
Mean Plasma Glucose: 108 mg/dL
eAG (mmol/L): 6 mmol/L

## 2023-01-11 LAB — VITAMIN D 1,25 DIHYDROXY
Vitamin D 1, 25 (OH)2 Total: 46 pg/mL (ref 18–72)
Vitamin D2 1, 25 (OH)2: <8 pg/mL
Vitamin D3 1, 25 (OH)2: 46 pg/mL

## 2023-01-11 LAB — VITAMIN B12: Vitamin B-12: 1207 pg/mL — ABNORMAL HIGH (ref 200–1100)

## 2023-01-26 ENCOUNTER — Ambulatory Visit
Admission: RE | Admit: 2023-01-26 | Discharge: 2023-01-26 | Disposition: A | Discharge status: Home / Self Care | Payer: 59 | Source: Ambulatory Visit | Attending: Family Medicine | Admitting: Family Medicine

## 2023-01-26 ENCOUNTER — Other Ambulatory Visit: Payer: Self-pay | Admitting: Family Medicine

## 2023-01-26 DIAGNOSIS — A419 Sepsis, unspecified organism: Secondary | ICD-10-CM | POA: Diagnosis not present

## 2023-01-26 DIAGNOSIS — Z780 Asymptomatic menopausal state: Secondary | ICD-10-CM | POA: Diagnosis not present

## 2023-01-26 DIAGNOSIS — J449 Chronic obstructive pulmonary disease, unspecified: Secondary | ICD-10-CM | POA: Diagnosis not present

## 2023-01-26 DIAGNOSIS — Z1152 Encounter for screening for COVID-19: Secondary | ICD-10-CM | POA: Diagnosis not present

## 2023-01-26 DIAGNOSIS — R6520 Severe sepsis without septic shock: Secondary | ICD-10-CM | POA: Diagnosis not present

## 2023-01-26 DIAGNOSIS — M810 Age-related osteoporosis without current pathological fracture: Secondary | ICD-10-CM

## 2023-01-26 DIAGNOSIS — J029 Acute pharyngitis, unspecified: Secondary | ICD-10-CM | POA: Diagnosis not present

## 2023-01-26 DIAGNOSIS — I10 Essential (primary) hypertension: Secondary | ICD-10-CM | POA: Diagnosis not present

## 2023-01-26 DIAGNOSIS — E119 Type 2 diabetes mellitus without complications: Secondary | ICD-10-CM | POA: Diagnosis not present

## 2023-01-26 DIAGNOSIS — Z79899 Other long term (current) drug therapy: Secondary | ICD-10-CM | POA: Diagnosis not present

## 2023-01-26 DIAGNOSIS — F17210 Nicotine dependence, cigarettes, uncomplicated: Secondary | ICD-10-CM | POA: Diagnosis not present

## 2023-01-26 DIAGNOSIS — Z7984 Long term (current) use of oral hypoglycemic drugs: Secondary | ICD-10-CM | POA: Diagnosis not present

## 2023-02-05 ENCOUNTER — Other Ambulatory Visit: Payer: Self-pay | Admitting: Family Medicine

## 2023-02-05 DIAGNOSIS — I10 Essential (primary) hypertension: Secondary | ICD-10-CM

## 2023-02-12 ENCOUNTER — Telehealth: Payer: Self-pay | Admitting: Family Medicine

## 2023-02-12 NOTE — Telephone Encounter (Signed)
Copied from CRM [id number]. Topic: General - Other >> Feb 12, 2023  3:57 PM Santiya F wrote: Reason for CRM: Jasmine with [facility] is calling because she needs a copy of the pt's labs and physical from 01/08/23 faxed over to  [phone].

## 2023-02-12 NOTE — Telephone Encounter (Signed)
Faxed

## 2023-02-23 ENCOUNTER — Ambulatory Visit (INDEPENDENT_AMBULATORY_CARE_PROVIDER_SITE_OTHER): Payer: 59

## 2023-02-23 VITALS — BP 118/70 | Ht 60.0 in | Wt 140.8 lb

## 2023-02-23 DIAGNOSIS — Z0000 Encounter for general adult medical examination without abnormal findings: Secondary | ICD-10-CM

## 2023-02-23 NOTE — Progress Notes (Signed)
Subjective:   Tonya Lawson is a 62 y.o. female who presents for Medicare Annual (Subsequent) preventive examination.  Review of Systems    Cardiac Risk Factors include: dyslipidemia;hypertension;sedentary lifestyle    Objective:    Today's Vitals   02/23/23 0922  BP: 118/70  Weight: 140 lb 12.8 oz (63.9 kg)  Height: 5' (1.524 m)   Body mass index is 27.5 kg/m.     02/23/2023    9:36 AM 11/24/2019    9:18 AM 10/24/2019   11:28 AM 10/10/2018   11:29 AM 04/09/2017   10:09 AM 10/09/2016   10:01 AM 04/07/2016    8:12 AM  Advanced Directives  Does Patient Have a Medical Advance Directive? Yes Yes Unable to assess, patient is non-responsive or altered mental status;Yes Yes No No No  Type of Estate agent of [place];Living will Out of facility DNR (pink MOST or yellow form) Healthcare Power of [place];Out of facility DNR (pink MOST or yellow form);Living will Out of facility DNR (pink MOST or yellow form)     Would patient like information on creating a medical advance directive?       No - patient declined information  Pre-existing out of facility DNR order (yellow form or pink MOST form)    Pink MOST form placed in chart (order not valid for inpatient use)       Current Medications (verified) Outpatient Encounter Medications as of 02/23/2023  Medication Sig   acetaminophen (TYLENOL) 500 MG tablet Take 1 tablet (500 mg total) by mouth every 6 (six) hours as needed.   Calcium Carbonate-Vitamin D 600-400 MG-UNIT tablet Take 1 tablet by mouth 2 (two) times daily.   hydrOXYzine (VISTARIL) 25 MG capsule TAKE 1 CAPSULET BY MOUTH DAILY EVERY 8 HOURS AS NEEDED FOR ANXIETY, CAN BE USEDBEFORE GOING TO DENTIST.   lisinopril-hydrochlorothiazide (ZESTORETIC) 10-12.5 MG tablet TAKE 1/2 TABLET BY MOUTH DAILY   loratadine (CLARITIN) 10 MG tablet Take 1 tablet (10 mg total) by mouth daily as needed for allergies.   Multiple Vitamins-Minerals (GNP ONE DAILY WOMENS 50+) TABS  TAKE 1 TABLET BY MOUTH ONCE DAILY FOR SUPPLEMENT   pravastatin (PRAVACHOL) 20 MG tablet TAKE 1 TABLET BY MOUTH DAILY   vitamin B-12 (CYANOCOBALAMIN) 100 MCG tablet TAKE 1 TABLET BY MOUTH ONCE DAILY FOR SUPPLEMENT   No facility-administered encounter medications on file as of 02/23/2023.    Allergies (verified) Patient has no known allergies.   History: Past Medical History:  Diagnosis Date   DNR (do not resuscitate) 08/29/2017   Hypercholesterolemia    Hypertension    Kyphosis of thoracic region    Mental retardation    Osteoporosis    Past Surgical History:  Procedure Laterality Date   ABDOMINAL HYSTERECTOMY     completed   COLONOSCOPY WITH PROPOFOL N/A 11/24/2019   Procedure: COLONOSCOPY WITH PROPOFOL;  Surgeon: Wyline Mood, MD;  Location[facility] ENDOSCOPY;  Service: Gastroenterology;  Laterality: N/A;   Family History  Problem Relation Age of Onset   Diabetes Mother    Stroke Mother    Cancer Father        brain   Breast cancer Maternal Aunt 76   Cancer Brother    Social History   Socioeconomic History   Marital status: Single    Spouse name: Not on file   Number of children: 0   Years of education: Not on file   Highest education level: Not on file  Occupational History   Not on file  Tobacco Use   Smoking status: Never   Smokeless tobacco: Never  Vaping Use   Vaping Use: Never used  Substance and Sexual Activity   Alcohol use: No   Drug use: No   Sexual activity: Never  Other Topics Concern   Not on file  Social History Narrative   Patient is in a care home with [facility].    Social Determinants of Health   Financial Resource Strain: Low Risk  (01/24/2022)   Overall Financial Resource Strain (CARDIA)    Difficulty of Paying Living Expenses: Not hard at all  Food Insecurity: No Food Insecurity (01/24/2022)   Hunger Vital Sign    Worried About Running Out of Food in the Last Year: Never true    Ran Out of Food in the Last Year: Never true   Transportation Needs: No Transportation Needs (02/23/2023)   PRAPARE - Administrator, Civil Service (Medical): No    Lack of Transportation (Non-Medical): No  Physical Activity: Unknown (02/23/2023)   Exercise Vital Sign    Days of Exercise per Week: 0 days    Minutes of Exercise per Session: Not on file  Stress: No Stress Concern Present (02/23/2023)   [employer] of Occupational Health - Occupational Stress Questionnaire    Feeling of Stress : Not at all  Social Connections: Moderately Isolated (02/23/2023)   Social Connection and Isolation Panel [NHANES]    Frequency of Communication with Friends and Family: More than three times a week    Frequency of Social Gatherings with Friends and Family: More than three times a week    Attends Religious Services: 1 to 4 times per year    Active Member of [employer] or Organizations: No    Attends Engineer, structural: Never    Marital Status: Never married    Tobacco Counseling Counseling given: Not Answered   Clinical Intake:  Pre-visit preparation completed: Yes  Pain : No/denies pain     BMI - recorded: 27.5 Nutritional Status: BMI 25 -29 Overweight Nutritional Risks: None Diabetes: No  How often do you need to have someone help you when you read instructions, pamphlets, or other written materials from your doctor or pharmacy?: 5 - Always  Diabetic?no  Interpreter Needed?: No  Comments: lives in group home Information entered by :: B.Krissa Utke,LPN   Activities of Daily Living    02/23/2023    9:36 AM 01/08/2023    8:55 AM  In your present state of health, do you have any difficulty performing the following activities:  Hearing? 0 0  Vision? 0 0  Difficulty concentrating or making decisions? 1 1  Walking or climbing stairs? 0 0  Dressing or bathing? 1 0  Doing errands, shopping? 1 1  Preparing Food and eating ? N   Using the Toilet? N   In the past six months, have you accidently leaked urine?  N   Do you have problems with loss of bowel control? N   Managing your Medications? N   Managing your Finances? N   Housekeeping or managing your Housekeeping? N     Patient Care Team: Danelle Berry, PA-C as PCP - General (Family Medicine)  Indicate any recent Medical Services you may have received from other than [facility] providers in the past year (date may be approximate).     Assessment:   This is a routine wellness examination for Adrian.  Hearing/Vision screen Hearing Screening - Comments:: Adequate hearing Vision Screening - Comments:: Adequate vision  Dr Larence Penning  Dietary issues and exercise activities discussed: Current Exercise Habits: The patient does not participate in regular exercise at present, Exercise limited by: None identified   Goals Addressed             This Visit's Progress    Increase physical activity   Not on track    Recommend increasing physical activity to at least 150 minutes per week        Depression Screen    02/23/2023    9:29 AM 01/08/2023    8:55 AM 11/09/2022   10:16 AM 07/10/2022    8:55 AM 01/24/2022   12:29 PM 01/06/2022   10:16 AM 12/21/2021    9:40 AM  PHQ 2/9 Scores  PHQ - 2 Score 0 0 0 0 0 0 0  PHQ- 9 Score  0 0 0  0 0    Fall Risk    02/23/2023    9:26 AM 01/08/2023    8:55 AM 11/09/2022   10:15 AM 07/10/2022    8:55 AM 01/24/2022   12:30 PM  Fall Risk   Falls in the past year? 0 0 0 0 0  Number falls in past yr: 0 0 0 0 0  Injury with Fall? 0 0 0 0 0  Risk for fall due to : No Fall Risks  No Fall Risks No Fall Risks No Fall Risks  Follow up Education provided;Falls prevention discussed  Falls prevention discussed;Education provided;Falls evaluation completed Falls prevention discussed;Education provided;Falls evaluation completed Falls prevention discussed    FALL RISK PREVENTION PERTAINING TO THE HOME:  Any stairs in or around the home? No  If so, are there any without handrails? No  Home free of loose throw rugs in  walkways, pet beds, electrical cords, etc? Yes  Adequate lighting in your home to reduce risk of falls? Yes   ASSISTIVE DEVICES UTILIZED TO PREVENT FALLS:  Life alert? No  Use of a cane, walker or w/c? No  Grab bars in the bathroom? Yes  Shower chair or bench in shower? Yes  Elevated toilet seat or a handicapped toilet? Yes   TIMED UP AND GO:  Was the test performed? Yes .  Length of time to ambulate 10 feet: 10 sec.   Gait steady and fast without use of assistive device  Cognitive Function: pt unable to complete        08/29/2017   11:28 AM  6CIT Screen  What Year? 4 points  What month? 3 points  What time? 3 points  Count back from 20 4 points  Months in reverse 4 points  Repeat phrase 10 points  Total Score 28 points    Immunizations Immunization History  Administered Date(s) Administered   Influenza,inj,Quad PF,6+ Mos 08/02/2015, 07/12/2016, 06/28/2017, 06/17/2018, 06/09/2019, 07/07/2020, 07/08/2021, 07/10/2022   Moderna Covid Bivalent Peds Booster(52mo Thru 21yrs) 07/19/2021   Moderna Sars-Covid-2 Vaccination 10/15/2019, 11/13/2019, 07/19/2020   PNEUMOCOCCAL CONJUGATE-20 07/10/2022   Pneumococcal Polysaccharide-23 06/23/2000, 07/24/2005   Td 06/20/2005   Tdap 12/13/2017   Zoster Recombinat (Shingrix) 07/23/2020, 12/21/2021   Zoster, Live 09/17/2013    TDAP status: Up to date  Flu Vaccine status: Up to date  Pneumococcal vaccine status: Up to date  Covid-19 vaccine status: Completed vaccines  Qualifies for Shingles Vaccine? Yes   Zostavax completed Yes   Shingrix Completed?: Yes  Screening Tests Health Maintenance  Topic Date Due   COVID-19 Vaccine (5 - 2023-24 season) 05/12/2022   INFLUENZA VACCINE  04/12/2023  MAMMOGRAM  12/12/2023   Medicare Annual Wellness (AWV)  02/23/2024   DEXA SCAN  01/25/2025   DTaP/Tdap/Td (3 - Td or Tdap) 12/14/2027   Colonoscopy  11/23/2029   Hepatitis C Screening  Completed   HIV Screening  Completed   Zoster  Vaccines- Shingrix  Completed   HPV VACCINES  Aged Out   Fecal DNA (Cologuard)  Discontinued    Health Maintenance  Health Maintenance Due  Topic Date Due   COVID-19 Vaccine (5 - 2023-24 season) 05/12/2022    Colorectal cancer screening: Type of screening: Colonoscopy. Completed yes. Repeat every 10 years  Mammogram status: Completed yes. Repeat every year  Bone Density status: Completed yes. Results reflect: Bone density results: OSTEOPOROSIS. Repeat every 3 years.  Lung Cancer Screening: (Low Dose CT Chest recommended if Age 50-80 years, 30 pack-year currently smoking OR have quit w/in 15years.) does not qualify.   Lung Cancer Screening Referral: no  Additional Screening:  Hepatitis C Screening: does not qualify; Completed yes  Vision Screening: Recommended annual ophthalmology exams for early detection of glaucoma and other disorders of the eye. Is the patient up to date with their annual eye exam?  Yes  Who is the provider or what is the name of the office in which the patient attends annual eye exams? Dr Larence Penning If pt is not established with a provider, would they like to be referred to a provider to establish care? No .   Dental Screening: Recommended annual dental exams for proper oral hygiene  Community Resource Referral / Chronic Care Management: CRR required this visit?  No   CCM required this visit?  No      Plan:     I have personally reviewed and noted the following in the patient's chart:   Medical and social history Use of alcohol, tobacco or illicit drugs  Current medications and supplements including opioid prescriptions. Patient is not currently taking opioid prescriptions. Functional ability and status Nutritional status Physical activity Advanced directives List of other physicians Hospitalizations, surgeries, and ER visits in previous 12 months Vitals Screenings to include cognitive, depression, and falls Referrals and appointments  In  addition, I have reviewed and discussed with patient certain preventive protocols, quality metrics, and best practice recommendations. A written personalized care plan for preventive services as well as general preventive health recommendations were provided to patient.     Tonya Lush, LPN   12/18/8117   Nurse Notes: Pt brought in by Day Manger, Lucendia Herrlich at group home she resides in. Pt answers yes or no to most of the questions (with caregiver confirming). No questions or concerns expressed during the visit.

## 2023-02-23 NOTE — Patient Instructions (Signed)
[name] , Thank you for taking time to come for your Medicare Wellness Visit. I appreciate your ongoing commitment to your health goals. Please review the following plan we discussed and let me know if I can assist you in the future.   These are the goals we discussed:  Goals      Increase physical activity     Recommend increasing physical activity to at least 150 minutes per week         This is a list of the screening recommended for you and due dates:  Health Maintenance  Topic Date Due   COVID-19 Vaccine (5 - 2023-24 season) 05/12/2022   Flu Shot  04/12/2023   Mammogram  12/12/2023   Medicare Annual Wellness Visit  02/23/2024   DEXA scan (bone density measurement)  01/25/2025   DTaP/Tdap/Td vaccine (3 - Td or Tdap) 12/14/2027   Colon Cancer Screening  11/23/2029   Hepatitis C Screening  Completed   HIV Screening  Completed   Zoster (Shingles) Vaccine  Completed   HPV Vaccine  Aged Out   Cologuard (Stool DNA test)  Discontinued    Advanced directives: yes  Conditions/risks identified: low falls risk  Next appointment: Follow up in one year for your annual wellness visit. 02/29/2024 @11 :00 in person  Preventive Care 40-64 Years, Female Preventive care refers to lifestyle choices and visits with your health care provider that can promote health and wellness. What does preventive care include? A yearly physical exam. This is also called an annual well check. Dental exams once or twice a year. Routine eye exams. Ask your health care provider how often you should have your eyes checked. Personal lifestyle choices, including: Daily care of your teeth and gums. Regular physical activity. Eating a healthy diet. Avoiding tobacco and drug use. Limiting alcohol use. Practicing safe sex. Taking low-dose aspirin daily starting at age 20. Taking vitamin and mineral supplements as recommended by your health care provider. What happens during an annual well check? The  services and screenings done by your health care provider during your annual well check will depend on your age, overall health, lifestyle risk factors, and family history of disease. Counseling  Your health care provider may ask you questions about your: Alcohol use. Tobacco use. Drug use. Emotional well-being. Home and relationship well-being. Sexual activity. Eating habits. Work and work Astronomer. Method of birth control. Menstrual cycle. Pregnancy history. Screening  You may have the following tests or measurements: Height, weight, and BMI. Blood pressure. Lipid and cholesterol levels. These may be checked every 5 years, or more frequently if you are over 74 years old. Skin check. Lung cancer screening. You may have this screening every year starting at age 9 if you have a 30-pack-year history of smoking and currently smoke or have quit within the past 15 years. Fecal occult blood test (FOBT) of the stool. You may have this test every year starting at age 26. Flexible sigmoidoscopy or colonoscopy. You may have a sigmoidoscopy every 5 years or a colonoscopy every 10 years starting at age 7. Hepatitis C blood test. Hepatitis B blood test. Sexually transmitted disease (STD) testing. Diabetes screening. This is done by checking your blood sugar (glucose) after you have not eaten for a while (fasting). You may have this done every 1-3 years. Mammogram. This may be done every 1-2 years. Talk to your health care provider about when you should start having regular mammograms. This may depend on whether you have a family history  of breast cancer. BRCA-related cancer screening. This may be done if you have a family history of breast, ovarian, tubal, or peritoneal cancers. Pelvic exam and Pap test. This may be done every 3 years starting at age 80. Starting at age 11, this may be done every 5 years if you have a Pap test in combination with an HPV test. Bone density scan. This is done to  screen for osteoporosis. You may have this scan if you are at high risk for osteoporosis. Discuss your test results, treatment options, and if necessary, the need for more tests with your health care provider. Vaccines  Your health care provider may recommend certain vaccines, such as: Influenza vaccine. This is recommended every year. Tetanus, diphtheria, and acellular pertussis (Tdap, Td) vaccine. You may need a Td booster every 10 years. Zoster vaccine. You may need this after age 28. Pneumococcal 13-valent conjugate (PCV13) vaccine. You may need this if you have certain conditions and were not previously vaccinated. Pneumococcal polysaccharide (PPSV23) vaccine. You may need one or two doses if you smoke cigarettes or if you have certain conditions. Talk to your health care provider about which screenings and vaccines you need and how often you need them. This information is not intended to replace advice given to you by your health care provider. Make sure you discuss any questions you have with your health care provider. Document Released: 09/24/2015 Document Revised: 05/17/2016 Document Reviewed: 06/29/2015 Elsevier Interactive Patient Education  2017 [place] Prevention in the Home Falls can cause injuries. They can happen to people of all ages. There are many things you can do to make your home safe and to help prevent falls. What can I do on the outside of my home? Regularly fix the edges of walkways and driveways and fix any cracks. Remove anything that might make you trip as you walk through a door, such as a raised step or threshold. Trim any bushes or trees on the path to your home. Use bright outdoor lighting. Clear any walking paths of anything that might make someone trip, such as rocks or tools. Regularly check to see if handrails are loose or broken. Make sure that both sides of any steps have handrails. Any raised decks and porches should have guardrails on  the edges. Have any leaves, snow, or ice cleared regularly. Use sand or salt on walking paths during winter. Clean up any spills in your garage right away. This includes oil or grease spills. What can I do in the bathroom? Use night lights. Install grab bars by the toilet and in the tub and shower. Do not use towel bars as grab bars. Use non-skid mats or decals in the tub or shower. If you need to sit down in the shower, use a plastic, non-slip stool. Keep the floor dry. Clean up any water that spills on the floor as soon as it happens. Remove soap buildup in the tub or shower regularly. Attach bath mats securely with double-sided non-slip rug tape. Do not have throw rugs and other things on the floor that can make you trip. What can I do in the bedroom? Use night lights. Make sure that you have a light by your bed that is easy to reach. Do not use any sheets or blankets that are too big for your bed. They should not hang down onto the floor. Have a firm chair that has side arms. You can use this for support while you get dressed.  Do not have throw rugs and other things on the floor that can make you trip. What can I do in the kitchen? Clean up any spills right away. Avoid walking on wet floors. Keep items that you use a lot in easy-to-reach places. If you need to reach something above you, use a strong step stool that has a grab bar. Keep electrical cords out of the way. Do not use floor polish or wax that makes floors slippery. If you must use wax, use non-skid floor wax. Do not have throw rugs and other things on the floor that can make you trip. What can I do with my stairs? Do not leave any items on the stairs. Make sure that there are handrails on both sides of the stairs and use them. Fix handrails that are broken or loose. Make sure that handrails are as long as the stairways. Check any carpeting to make sure that it is firmly attached to the stairs. Fix any carpet that is loose  or worn. Avoid having throw rugs at the top or bottom of the stairs. If you do have throw rugs, attach them to the floor with carpet tape. Make sure that you have a light switch at the top of the stairs and the bottom of the stairs. If you do not have them, ask someone to add them for you. What else can I do to help prevent falls? Wear shoes that: Do not have high heels. Have rubber bottoms. Are comfortable and fit you well. Are closed at the toe. Do not wear sandals. If you use a stepladder: Make sure that it is fully opened. Do not climb a closed stepladder. Make sure that both sides of the stepladder are locked into place. Ask someone to hold it for you, if possible. Clearly mark and make sure that you can see: Any grab bars or handrails. First and last steps. Where the edge of each step is. Use tools that help you move around (mobility aids) if they are needed. These include: Canes. Walkers. Scooters. Crutches. Turn on the lights when you go into a dark area. Replace any light bulbs as soon as they burn out. Set up your furniture so you have a clear path. Avoid moving your furniture around. If any of your floors are uneven, fix them. If there are any pets around you, be aware of where they are. Review your medicines with your doctor. Some medicines can make you feel dizzy. This can increase your chance of falling. Ask your doctor what other things that you can do to help prevent falls. This information is not intended to replace advice given to you by your health care provider. Make sure you discuss any questions you have with your health care provider. Document Released: 06/24/2009 Document Revised: 02/03/2016 Document Reviewed: 10/02/2014 Elsevier Interactive Patient Education  2017 [employer].

## 2023-03-06 ENCOUNTER — Ambulatory Visit (INDEPENDENT_AMBULATORY_CARE_PROVIDER_SITE_OTHER): Payer: Medicare Other | Admitting: Vascular Surgery

## 2023-03-06 VITALS — BP 116/80 | HR 84 | Resp 18 | Ht 59.0 in | Wt 141.0 lb

## 2023-03-06 DIAGNOSIS — I890 Lymphedema, not elsewhere classified: Secondary | ICD-10-CM | POA: Diagnosis not present

## 2023-03-06 DIAGNOSIS — I10 Essential (primary) hypertension: Secondary | ICD-10-CM | POA: Diagnosis not present

## 2023-03-06 DIAGNOSIS — E785 Hyperlipidemia, unspecified: Secondary | ICD-10-CM

## 2023-03-06 NOTE — Progress Notes (Signed)
MRN : [id number]  Tonya Lawson is a 62 y.o. (1961-05-11) female who presents with chief complaint of  Chief Complaint  Patient presents with   Venous Insufficiency  .  History of Present Illness: Patient returns today in follow up of her lymphedema.  Her caretaker says she has been using her lymphedema pumps intermittently but not daily.  She does wear compression socks every day.  They do elevate her legs well.  She cannot provide any history and this is obtained from her caretakers.  Current Outpatient Medications  Medication Sig Dispense Refill   acetaminophen (TYLENOL) 500 MG tablet Take 1 tablet (500 mg total) by mouth every 6 (six) hours as needed. 30 tablet 0   Calcium Carbonate-Vitamin D 600-400 MG-UNIT tablet Take 1 tablet by mouth 2 (two) times daily. 60 tablet 11   Cranberry 180 MG CAPS Take by mouth daily.     hydrOXYzine (VISTARIL) 25 MG capsule TAKE 1 CAPSULET BY MOUTH DAILY EVERY 8 HOURS AS NEEDED FOR ANXIETY, CAN BE USEDBEFORE GOING TO DENTIST. 30 capsule 11   lisinopril-hydrochlorothiazide (ZESTORETIC) 10-12.5 MG tablet TAKE 1/2 TABLET BY MOUTH DAILY 30 tablet 1   loratadine (CLARITIN) 10 MG tablet Take 1 tablet (10 mg total) by mouth daily as needed for allergies. 90 tablet 3   Multiple Vitamins-Minerals (GNP ONE DAILY WOMENS 50+) TABS TAKE 1 TABLET BY MOUTH ONCE DAILY FOR SUPPLEMENT 30 tablet 11   pravastatin (PRAVACHOL) 20 MG tablet TAKE 1 TABLET BY MOUTH DAILY 31 tablet 11   vitamin B-12 (CYANOCOBALAMIN) 100 MCG tablet TAKE 1 TABLET BY MOUTH ONCE DAILY FOR SUPPLEMENT 30 tablet 11   No current facility-administered medications for this visit.    Past Medical History:  Diagnosis Date   DNR (do not resuscitate) 08/29/2017   Hypercholesterolemia    Hypertension    Kyphosis of thoracic region    Mental retardation    Osteoporosis     Past Surgical History:  Procedure Laterality Date   ABDOMINAL HYSTERECTOMY     completed   COLONOSCOPY WITH PROPOFOL N/A  11/24/2019   Procedure: COLONOSCOPY WITH PROPOFOL;  Surgeon: Wyline Mood, MD;  Location: [facility];  Service: Gastroenterology;  Laterality: N/A;     Social History   Tobacco Use   Smoking status: Never   Smokeless tobacco: Never  Vaping Use   Vaping Use: Never used  Substance Use Topics   Alcohol use: No   Drug use: No      Family History  Problem Relation Age of Onset   Diabetes Mother    Stroke Mother    Cancer Father        brain   Breast cancer Maternal Aunt 25   Cancer Brother      No Known Allergies   REVIEW OF SYSTEMS (Negative unless checked) She cannot really provide any history.  This is taken from her caretakers.  Constitutional: [] Weight loss  [] Fever  [] Chills Cardiac: [] Chest pain   [] Chest pressure   [] Palpitations   [] Shortness of breath when laying flat   [] Shortness of breath at rest   [] Shortness of breath with exertion. Vascular:  [] Pain in legs with walking   [] Pain in legs at rest   [] Pain in legs when laying flat   [] Claudication   [] Pain in feet when walking  [] Pain in feet at rest  [] Pain in feet when laying flat   [] History of DVT   [] Phlebitis   [x] Swelling in legs   [] Varicose veins   []   Non-healing ulcers Pulmonary:   [] Uses home oxygen   [] Productive cough   [] Hemoptysis   [] Wheeze  [] COPD   [] Asthma Neurologic:  [] Dizziness  [] Blackouts   [] Seizures   [] History of stroke   [] History of TIA  [] Aphasia   [] Temporary blindness   [] Dysphagia   [] Weakness or numbness in arms   [] Weakness or numbness in legs  X cognitive issues Musculoskeletal:  [] Arthritis   [] Joint swelling   [] Joint pain   [] Low back pain Hematologic:  [] Easy bruising  [] Easy bleeding   [] Hypercoagulable state   [] Anemic  [] Hepatitis Gastrointestinal:  [] Blood in stool   [] Vomiting blood  [] Gastroesophageal reflux/heartburn   [] Abdominal pain Genitourinary:  [] Chronic kidney disease   [] Difficult urination  [] Frequent urination  [] Burning with urination   [] Hematuria Skin:   [] Rashes   [] Ulcers   [] Wounds Psychological:  [] History of anxiety   []  History of major depression.  Physical Examination  BP 116/80 (BP Location: Right Arm)   Pulse 84   Resp 18   Ht 4\' 11"  (1.499 m)   Wt 141 lb (64 kg)   BMI 28.48 kg/m  Gen:  WD/WN, NAD Head: /AT, No temporalis wasting. Ear/Nose/Throat: Hearing grossly intact, nares w/o erythema or drainage Eyes: Conjunctiva clear. Sclera non-icteric Neck: Supple.  Trachea midline Pulmonary:  Good air movement, no use of accessory muscles.  Cardiac: RRR, no JVD Vascular:  Vessel Right Left  Radial Palpable Palpable                          PT Palpable Palpable  DP Palpable Palpable   Gastrointestinal: soft, non-tender/non-distended. No guarding/reflex.  Musculoskeletal: M/S 5/5 throughout.  No deformity or atrophy.  Trace to 1+ bilateral lower extremity edema. Neurologic: Sensation grossly intact in extremities.  Symmetrical.  Essentially nonverbal Psychiatric: Judgment and insight are very poor Dermatologic: No rashes or ulcers noted.  No cellulitis or open wounds.      Labs Recent Results (from the past 2160 hour(s))  Lipid panel     Status: None   Collection Time: 01/08/23 10:42 AM  Result Value Ref Range   Cholesterol 146 <200 mg/dL   HDL 67 > OR = 50 mg/dL   Triglycerides 161 <096 mg/dL   LDL Cholesterol (Calc) 60 mg/dL (calc)    Comment: Reference range: <100 . Desirable range <100 mg/dL for primary prevention;   <70 mg/dL for patients with CHD or diabetic patients  with > or = 2 CHD risk factors. Marland Kitchen LDL-C is now calculated using the Martin-Hopkins  calculation, which is a validated novel method providing  better accuracy than the Friedewald equation in the  estimation of LDL-C.  Horald Pollen et al. Lenox Ahr. 0454;098(11): 2061-2068  (http://education.[facility].com/faq/FAQ164)    Total CHOL/HDL Ratio 2.2 <5.0 (calc)   Non-HDL Cholesterol (Calc) 79 <914 mg/dL (calc)    Comment: For patients  with diabetes plus 1 major ASCVD risk  factor, treating to a non-HDL-C goal of <100 mg/dL  (LDL-C of <78 mg/dL) is considered a therapeutic  option.   CBC with Differential/Platelet     Status: None   Collection Time: 01/08/23 10:42 AM  Result Value Ref Range   WBC 9.2 3.8 - 10.8 Thousand/uL   RBC 4.71 3.80 - 5.10 Million/uL   Hemoglobin 13.5 11.7 - 15.5 g/dL   HCT 29.5 62.1 - 30.8 %   MCV 86.6 80.0 - 100.0 fL   MCH 28.7 27.0 - 33.0 pg   MCHC 33.1  32.0 - 36.0 g/dL   RDW 16.1 09.6 - 04.5 %   Platelets 386 140 - 400 Thousand/uL   MPV 9.4 7.5 - 12.5 fL   Neutro Abs 4,977 1,500 - 7,800 cells/uL   Lymphs Abs 3,468 850 - 3,900 cells/uL   Absolute Monocytes 561 200 - 950 cells/uL   Eosinophils Absolute 110 15 - 500 cells/uL   Basophils Absolute 83 0 - 200 cells/uL   Neutrophils Relative % 54.1 %   Total Lymphocyte 37.7 %   Monocytes Relative 6.1 %   Eosinophils Relative 1.2 %   Basophils Relative 0.9 %  COMPLETE METABOLIC PANEL WITH GFR     Status: None   Collection Time: 01/08/23 10:42 AM  Result Value Ref Range   Glucose, Bld 84 65 - 99 mg/dL    Comment: .            Fasting reference interval .    BUN 15 7 - 25 mg/dL   Creat 4.09 8.11 - 9.14 mg/dL   eGFR 95 > OR = 60 NW/GNF/6.21H0   BUN/Creatinine Ratio SEE NOTE: 6 - 22 (calc)    Comment:    Not Reported: BUN and Creatinine are within    reference range. .    Sodium 137 135 - 146 mmol/L   Potassium 4.2 3.5 - 5.3 mmol/L   Chloride 104 98 - 110 mmol/L   CO2 27 20 - 32 mmol/L   Calcium 9.1 8.6 - 10.4 mg/dL   Total Protein 6.4 6.1 - 8.1 g/dL   Albumin 4.2 3.6 - 5.1 g/dL   Globulin 2.2 1.9 - 3.7 g/dL (calc)   AG Ratio 1.9 1.0 - 2.5 (calc)   Total Bilirubin 0.4 0.2 - 1.2 mg/dL   Alkaline phosphatase (APISO) 78 37 - 153 U/L   AST 17 10 - 35 U/L   ALT 15 6 - 29 U/L  Vitamin B12     Status: Abnormal   Collection Time: 01/08/23 10:42 AM  Result Value Ref Range   Vitamin B-12 1,207 (H) 200 - 1,100 pg/mL  Hemoglobin A1c      Status: None   Collection Time: 01/08/23 10:42 AM  Result Value Ref Range   Hgb A1c MFr Bld 5.4 <5.7 % of total Hgb    Comment: For the purpose of screening for the presence of diabetes: . <5.7%       Consistent with the absence of diabetes 5.7-6.4%    Consistent with increased risk for diabetes             (prediabetes) > or =6.5%  Consistent with diabetes . This assay result is consistent with a decreased risk of diabetes. . Currently, no consensus exists regarding use of hemoglobin A1c for diagnosis of diabetes in children. . According to American Diabetes Association (ADA) guidelines, hemoglobin A1c <7.0% represents optimal control in non-pregnant diabetic patients. Different metrics may apply to specific patient populations.  Standards of Medical Care in Diabetes(ADA). .    Mean Plasma Glucose 108 mg/dL   eAG (mmol/L) 6.0 mmol/L    Comment: . This test was performed on the Roche cobas c503 platform. Effective 06/19/22, a change in test platforms from the Abbott Architect to the Roche cobas c503 may have shifted HbA1c results compared to historical results. Based on laboratory validation testing conducted at [facility], the Roche platform relative to the Abbott platform had an average increase in HbA1c value of < or = 0.3%. This difference is within accepted  variability established by  the [employer]. Note that not all individuals will have had a shift in their results and direct comparisons between historical and current results for testing conducted on different platforms is not recommended.   Vitamin D 1,25 dihydroxy     Status: None   Collection Time: 01/08/23 10:42 AM  Result Value Ref Range   Vitamin D 1, 25 (OH)2 Total 46 18 - 72 pg/mL   Vitamin D3 1, 25 (OH)2 46 pg/mL   Vitamin D2 1, 25 (OH)2 <8 pg/mL    Comment: (Note) Vitamin D3, 1,25(OH)2 indicates both endogenous  production and supplementation. Vitamin D2, 1,25(OH)2  is  an indicator of exogenous sources, such as diet or  supplementation. Interpretation and therapy are based on  measurement of Vitamin D, 1,25 (OH)2, Total. . This test was developed, and its analytical performance  characteristics have been determined by [employer]. It has not been cleared or approved by the  FDA. This assay has been validated pursuant to the CLIA  regulations and is used for clinical purposes. . For additional information, please refer to http://education.[facility].com/faq/FAQ199 (This link is being provided for  informational/educational purposes only.) . [facility] [address] [place] [address] [id number] Junita Push L. Thompson Caul, MD, PhD     Radiology No results found.  Assessment/Plan Hypertension blood pressure control important in reducing the progression of atherosclerotic disease. On appropriate oral medications.     Dyslipidemia lipid control important in reducing the progression of atherosclerotic disease. Continue statin therapy   Lymphedema Symptom control is reasonable.  Wearing compression stockings daily.  Uses a lymphedema pump every other day or so.  No new ulceration or swelling.  Activity level is good.  We will go to an annual follow-up at this point.     Festus Barren, MD  03/06/2023 1:20 PM    This note was created with Dragon medical transcription system.  Any errors from dictation are purely unintentional

## 2023-04-02 ENCOUNTER — Telehealth: Payer: Self-pay | Admitting: Family Medicine

## 2023-04-02 DIAGNOSIS — R94120 Abnormal auditory function study: Secondary | ICD-10-CM

## 2023-04-02 NOTE — Telephone Encounter (Unsigned)
Copied from CRM [id number]. Topic: Referral - Status >> Apr 02, 2023  3:17 PM Ja-Kwan M wrote: Reason for CRM: Pt requests referral to ENT because she tried to schedule an appt but she was told that the referral had not been received.

## 2023-04-02 NOTE — Telephone Encounter (Signed)
Tonya Lawson requesting referral fr the whisper test. I advised you were out of the office and would be back by Wednesday.

## 2023-04-04 NOTE — Telephone Encounter (Signed)
Called Lucendia Herrlich no answer left detailed vm

## 2023-05-07 ENCOUNTER — Other Ambulatory Visit: Payer: Self-pay | Admitting: Family Medicine

## 2023-05-07 DIAGNOSIS — I10 Essential (primary) hypertension: Secondary | ICD-10-CM

## 2023-06-28 ENCOUNTER — Other Ambulatory Visit: Payer: Self-pay | Admitting: Family Medicine

## 2023-07-10 ENCOUNTER — Encounter: Payer: Self-pay | Admitting: Physician Assistant

## 2023-07-10 ENCOUNTER — Ambulatory Visit (INDEPENDENT_AMBULATORY_CARE_PROVIDER_SITE_OTHER): Payer: 59 | Admitting: Physician Assistant

## 2023-07-10 VITALS — BP 114/78 | HR 67 | Temp 97.6°F | Resp 16 | Ht 59.0 in | Wt 144.0 lb

## 2023-07-10 DIAGNOSIS — Z23 Encounter for immunization: Secondary | ICD-10-CM | POA: Diagnosis not present

## 2023-07-10 DIAGNOSIS — E785 Hyperlipidemia, unspecified: Secondary | ICD-10-CM

## 2023-07-10 DIAGNOSIS — E559 Vitamin D deficiency, unspecified: Secondary | ICD-10-CM | POA: Diagnosis not present

## 2023-07-10 DIAGNOSIS — E538 Deficiency of other specified B group vitamins: Secondary | ICD-10-CM

## 2023-07-10 DIAGNOSIS — I10 Essential (primary) hypertension: Secondary | ICD-10-CM | POA: Diagnosis not present

## 2023-07-10 NOTE — Assessment & Plan Note (Signed)
Chronic, historic condition Appears well managed on current regimen comprised of Lisinopril- hydrochlorothiazide 10-12.5 mg PO every day  Continue current regimen  Follow up in 6 months or sooner if concerns arise

## 2023-07-10 NOTE — Progress Notes (Addendum)
Established Patient Office Visit  Name: Tonya Lawson   MRN: [id number]    DOB: [birth year]   Date:07/10/2023  Today's Provider: Jacquelin Hawking, MHS, PA-C Introduced myself to the patient as a PA-C and provided education on APPs in clinical practice.         Subjective  Chief Complaint  Chief Complaint  Patient presents with   Hypertension   Hyperlipidemia    HPI  HYPERTENSION / HYPERLIPIDEMIA Satisfied with current treatment? yes Duration of hypertension: years BP monitoring frequency: rarely BP range:  BP medication side effects: no Past BP meds: lisinopril-HCTZ Duration of hyperlipidemia: chronic Cholesterol medication side effects: no Cholesterol supplements: none Past cholesterol medications: pravastatin (pravachol) Medication compliance: good compliance Aspirin: no Recent stressors: no Recurrent headaches: no Visual changes: no Palpitations: no Dyspnea: no Chest pain: no Lower extremity edema: no Dizzy/lightheaded: no  She is still walking almost every day (10-15 minutes per day)   Patient Active Problem List   Diagnosis Date Noted   Lymphedema 08/24/2020   Swelling of limb 02/17/2020   Dyslipidemia 10/10/2018   Dental caries 10/10/2018   Overweight (BMI 25.0-29.9) 02/01/2018   DNR (do not resuscitate) 08/29/2017   Hypertension    Osteoporosis    Intellectual disability    Kyphosis of thoracic region     Past Surgical History:  Procedure Laterality Date   ABDOMINAL HYSTERECTOMY     completed   COLONOSCOPY WITH PROPOFOL N/A 11/24/2019   Procedure: COLONOSCOPY WITH PROPOFOL;  Surgeon: Wyline Mood, MD;  Location: [facility] END[facility]COPY;  Service: Gastroenterology;  Laterality: N/A;    Family History  Problem Relation Age of Onset   Diabetes Mother    Stroke Mother    Cancer Father        brain   Breast cancer Maternal Aunt 29   Cancer Brother     Social History   Tobacco Use   Smoking status: Never   Smokeless tobacco: Never   Substance Use Topics   Alcohol use: No     Current Outpatient Medications:    acetaminophen (TYLENOL) 500 MG tablet, Take 1 tablet (500 mg total) by mouth every 6 (six) hours as needed., Disp: 30 tablet, Rfl: 0   Calcium Carbonate-Vitamin D 600-400 MG-UNIT tablet, Take 1 tablet by mouth 2 (two) times daily., Disp: 60 tablet, Rfl: 11   chlorhexidine (PERIDEX) 0.12 % solution, SMARTSIG:By Mouth, Disp: , Rfl:    Cranberry 180 MG CAPS, Take by mouth daily., Disp: , Rfl:    Cranberry-Vitamin C-Vitamin E (CRANBERRY PLUS VITAMIN C) 4200-20-3 MG-MG-UNIT CAPS, TAKE 1 SOFTGEL BY MOUTH ONCE DAILY FOR URINARY HEALTH, Disp: 30 capsule, Rfl: 1   hydrOXYzine (VISTARIL) 25 MG capsule, TAKE 1 CAPSULET BY MOUTH DAILY EVERY 8 HOURS AS NEEDED FOR ANXIETY, CAN BE USEDBEFORE GOING TO DENTIST., Disp: 30 capsule, Rfl: 11   lisinopril-hydrochlorothiazide (ZESTORETIC) 10-12.5 MG tablet, TAKE 1/2 TABLET BY MOUTH DAILY, Disp: 30 tablet, Rfl: 1   loratadine (CLARITIN) 10 MG tablet, Take 1 tablet (10 mg total) by mouth daily as needed for allergies., Disp: 90 tablet, Rfl: 3   Multiple Vitamins-Minerals (GNP ONE DAILY WOMENS 50+) TABS, TAKE 1 TABLET BY MOUTH ONCE DAILY FOR SUPPLEMENT, Disp: 30 tablet, Rfl: 11   pravastatin (PRAVACHOL) 20 MG tablet, TAKE 1 TABLET BY MOUTH DAILY, Disp: 31 tablet, Rfl: 11   vitamin B-12 (CYANOCOBALAMIN) 100 MCG tablet, TAKE 1 TABLET BY MOUTH ONCE DAILY FOR SUPPLEMENT, Disp: 30 tablet, Rfl: 11  No Known Allergies  I personally reviewed active problem list, medication list, allergies, health maintenance, notes from last encounter, lab results with the patient/caregiver today.   ROS  See HPI for relevant ROS   Objective  Vitals:   07/10/23 0941  BP: 114/78  Pulse: 67  Resp: 16  Temp: 97.6 F (36.4 C)  TempSrc: Oral  SpO2: 98%  Weight: 144 lb (65.3 kg)  Height: 4\' 11"  (1.499 m)    Body mass index is 29.08 kg/m.  Physical Exam Vitals reviewed.  Constitutional:       General: She is awake.     Appearance: Normal appearance. She is well-developed and well-groomed.  HENT:     Head: Normocephalic and atraumatic.  Cardiovascular:     Rate and Rhythm: Normal rate and regular rhythm.     Pulses: Normal pulses.          Radial pulses are 2+ on the right side and 2+ on the left side.     Heart sounds: Normal heart sounds. No murmur heard.    No friction rub. No gallop.  Pulmonary:     Effort: Pulmonary effort is normal.     Breath sounds: Normal breath sounds. No decreased air movement. No decreased breath sounds, wheezing, rhonchi or rales.  Musculoskeletal:     Cervical back: Normal range of motion.     Right lower leg: No edema.     Left lower leg: No edema.     Comments:    Neurological:     General: No focal deficit present.     Mental Status: She is alert. Mental status is at baseline.     GCS: GCS eye subscore is 4. GCS verbal subscore is 5. GCS motor subscore is 6.  Psychiatric:        Attention and Perception: Attention normal.        Mood and Affect: Mood and affect normal.        Behavior: Behavior normal. Behavior is cooperative.        Cognition and Memory: Cognition is impaired.      No results found for this or any previous visit (from the past 2160 hour(s)).   PHQ2/9:    07/10/2023    9:40 AM 02/23/2023    9:29 AM 01/08/2023    8:55 AM 11/09/2022   10:16 AM 07/10/2022    8:55 AM  Depression screen PHQ 2/9  Decreased Interest 0 0 0 0 0  Down, Depressed, Hopeless 0 0 0 0 0  PHQ - 2 Score 0 0 0 0 0  Altered sleeping 0  0 0 0  Tired, decreased energy 0  0 0 0  Change in appetite 0  0 0 0  Feeling bad or failure about yourself  0  0 0 0  Trouble concentrating 0  0 0 0  Moving slowly or fidgety/restless 0  0 0 0  Suicidal thoughts 0  0 0 0  PHQ-9 Score 0  0 0 0  Difficult doing work/chores Not difficult at all   Not difficult at all Not difficult at all      Fall Risk:    07/10/2023    9:40 AM 02/23/2023    9:26 AM  01/08/2023    8:55 AM 11/09/2022   10:15 AM 07/10/2022    8:55 AM  Fall Risk   Falls in the past year? 0 0 0 0 0  Number falls in past yr: 0 0 0 0 0  Injury with Fall? 0 0 0 0 0  Risk for fall due to : No Fall Risks No Fall Risks  No Fall Risks No Fall Risks  Follow up Falls prevention discussed;Education provided;Falls evaluation completed Education provided;Falls prevention discussed  Falls prevention discussed;Education provided;Falls evaluation completed Falls prevention discussed;Education provided;Falls evaluation completed      Functional Status Survey: Is the patient deaf or have difficulty hearing?: No Does the patient have difficulty seeing, even when wearing glasses/contacts?: No Does the patient have difficulty concentrating, remembering, or making decisions?: No Does the patient have difficulty walking or climbing stairs?: No Does the patient have difficulty dressing or bathing?: No Does the patient have difficulty doing errands alone such as visiting a doctor's office or shopping?: No    Assessment & Plan  Problem List Items Addressed This Visit       Cardiovascular and Mediastinum   Hypertension - Primary (Chronic)    Chronic, historic condition Appears well managed on current regimen comprised of Lisinopril- hydrochlorothiazide 10-12.5 mg PO every day  Continue current regimen  Follow up in 6 months or sooner if concerns arise        Relevant Orders   COMPLETE METABOLIC PANEL WITH GFR   CBC w/Diff/Platelet     Other   Dyslipidemia    Chronic, historic condition Appears well managed on current regimen comprised of pravastatin 20 mg PO every day  Continue current regimen Recheck lipid panel today Results to dictate further management  Follow up in 6 months or sooner if concerns arise        Relevant Orders   Lipid Profile   Other Visit Diagnoses     Need for influenza vaccination       Relevant Orders   Flu vaccine trivalent PF, 6mos and  older(Flulaval,Afluria,Fluarix,Fluzone) (Completed)   Vitamin D deficiency       Relevant Orders   Vitamin D (25 hydroxy)   B12 deficiency       Relevant Orders   B12        Return in about 6 months (around 01/08/2024) for HLD, HTN, Annual physical.   I, Adalena Abdulla E Sanaia Jasso, PA-C, have reviewed all documentation for this visit. The documentation on 07/10/23 for the exam, diagnosis, procedures, and orders are all accurate and complete.   Jacquelin Hawking, MHS, PA-C [facility]

## 2023-07-10 NOTE — Assessment & Plan Note (Signed)
Chronic, historic condition Appears well managed on current regimen comprised of pravastatin 20 mg PO every day  Continue current regimen Recheck lipid panel today Results to dictate further management  Follow up in 6 months or sooner if concerns arise

## 2023-07-11 LAB — LIPID PANEL
Cholesterol: 158 mg/dL (ref <200)
HDL: 68 mg/dL (ref >=50)
LDL Cholesterol (Calc): 68 mg/dL
Non-HDL Cholesterol (Calc): 90 mg/dL (ref <130)
Total CHOL/HDL Ratio: 2.3 (calc) (ref <5.0)
Triglycerides: 132 mg/dL (ref <150)

## 2023-07-11 LAB — CBC WITH DIFFERENTIAL/PLATELET
Absolute Lymphocytes: 3825 {cells}/uL (ref 850–3900)
Absolute Monocytes: 683 {cells}/uL (ref 200–950)
Basophils Absolute: 71 {cells}/uL (ref 0–200)
Basophils Relative: 0.7 %
Eosinophils Absolute: 133 {cells}/uL (ref 15–500)
Eosinophils Relative: 1.3 %
HCT: 44 % (ref 35.0–45.0)
Hemoglobin: 14.1 g/dL (ref 11.7–15.5)
MCH: 28.1 pg (ref 27.0–33.0)
MCHC: 32 g/dL (ref 32.0–36.0)
MCV: 87.8 fL (ref 80.0–100.0)
MPV: 9.4 fL (ref 7.5–12.5)
Monocytes Relative: 6.7 %
Neutro Abs: 5488 {cells}/uL (ref 1500–7800)
Neutrophils Relative %: 53.8 %
Platelets: 403 10*3/uL — ABNORMAL HIGH (ref 140–400)
RBC: 5.01 10*6/uL (ref 3.80–5.10)
RDW: 13.4 % (ref 11.0–15.0)
Total Lymphocyte: 37.5 %
WBC: 10.2 10*3/uL (ref 3.8–10.8)

## 2023-07-11 LAB — COMPLETE METABOLIC PANEL WITH GFR
AG Ratio: 1.7 (calc) (ref 1.0–2.5)
ALT: 14 U/L (ref 6–29)
AST: 17 U/L (ref 10–35)
Albumin: 4.2 g/dL (ref 3.6–5.1)
Alkaline phosphatase (APISO): 94 U/L (ref 37–153)
BUN: 17 mg/dL (ref 7–25)
CO2: 29 mmol/L (ref 20–32)
Calcium: 9.9 mg/dL (ref 8.6–10.4)
Chloride: 103 mmol/L (ref 98–110)
Creat: 0.82 mg/dL (ref 0.50–1.05)
Globulin: 2.5 g/dL (ref 1.9–3.7)
Glucose, Bld: 82 mg/dL (ref 65–99)
Potassium: 4.2 mmol/L (ref 3.5–5.3)
Sodium: 140 mmol/L (ref 135–146)
Total Bilirubin: 0.3 mg/dL (ref 0.2–1.2)
Total Protein: 6.7 g/dL (ref 6.1–8.1)
eGFR: 81 mL/min/{1.73_m2} (ref >=60)

## 2023-07-11 LAB — VITAMIN B12: Vitamin B-12: 1485 pg/mL — ABNORMAL HIGH (ref 200–1100)

## 2023-07-11 LAB — VITAMIN D 25 HYDROXY (VIT D DEFICIENCY, FRACTURES): Vit D, 25-Hydroxy: 35 ng/mL (ref 30–100)

## 2023-07-11 NOTE — Progress Notes (Signed)
Your labs are back Your CBC is overall normal, no signs of anemia Your vitamin B12 is in normal range Your cholesterol looks great Your electrolytes, liver and kidney function looks great Your vitamin D is in normal range but is on the lower side.  I recommend continuing with your vitamin D supplementation to help improve this. Please continue current medications as directed Follow-up as planned.  Please let us know if you have further questions or concerns

## 2023-08-29 ENCOUNTER — Other Ambulatory Visit: Payer: Self-pay | Admitting: Family Medicine

## 2023-08-29 DIAGNOSIS — I10 Essential (primary) hypertension: Secondary | ICD-10-CM

## 2023-09-03 ENCOUNTER — Ambulatory Visit: Payer: Self-pay

## 2023-09-03 NOTE — Telephone Encounter (Signed)
Summary: need cough mediation   Rosana Berger called in saying patient has  cough and would like to have medication sent to [facility] in [place]     boveChief Complaint: Non-productive cough. Symptoms: Above Frequency: Saturday Pertinent Negatives: Patient denies  Disposition: [] ED /[x] Urgent Care (no appt availability in office) / [] Appointment(In office/virtual)/ []  [place] [facility]/ [] Home Care/ [] Refused Recommended Disposition /[] [facility]/ []  Follow-up with PCP Additional Notes: May take pt. If symptoms worsen.  Reason for Disposition  [1] Continuous (nonstop) coughing interferes with work or school AND [2] no improvement using cough treatment per Care Advice  Answer Assessment - Initial Assessment Questions 1. ONSET: "When did the cough begin?"      Saturday 2. SEVERITY: "How bad is the cough today?"      Moderate 3. SPUTUM: "Describe the color of your sputum" (none, dry cough; clear, white, yellow, green)     None 4. HEMOPTYSIS: "Are you coughing up any blood?" If so ask: "How much?" (flecks, streaks, tablespoons, etc.)     No 5. DIFFICULTY BREATHING: "Are you having difficulty breathing?" If Yes, ask: "How bad is it?" (e.g., mild, moderate, severe)    - MILD: No SOB at rest, mild SOB with walking, speaks normally in sentences, can lie down, no retractions, pulse < 100.    - MODERATE: SOB at rest, SOB with minimal exertion and prefers to sit, cannot lie down flat, speaks in phrases, mild retractions, audible wheezing, pulse 100-120.    - SEVERE: Very SOB at rest, speaks in single words, struggling to breathe, sitting hunched forward, retractions, pulse > 120      No 6. FEVER: "Do you have a fever?" If Yes, ask: "What is your temperature, how was it measured, and when did it start?"     No 7. CARDIAC HISTORY: "Do you have any history of heart disease?" (e.g., heart attack, congestive heart failure)      No 8. LUNG HISTORY: "Do you have any history of lung  disease?"  (e.g., pulmonary embolus, asthma, emphysema)     No 9. PE RISK FACTORS: "Do you have a history of blood clots?" (or: recent major surgery, recent prolonged travel, bedridden)     No 10. OTHER SYMPTOMS: "Do you have any other symptoms?" (e.g., runny nose, wheezing, chest pain)       No 11. PREGNANCY: "Is there any chance you are pregnant?" "When was your last menstrual period?"       No 12. TRAVEL: "Have you traveled out of the country in the last month?" (e.g., travel history, exposures)       No  Protocols used: Cough - Acute Non-Productive-A-AH

## 2023-10-29 ENCOUNTER — Encounter: Payer: Self-pay | Admitting: Family Medicine

## 2023-10-29 ENCOUNTER — Ambulatory Visit: Payer: 59 | Admitting: Family Medicine

## 2023-10-29 VITALS — BP 122/84 | HR 71 | Resp 16 | Ht 59.0 in | Wt 147.0 lb

## 2023-10-29 DIAGNOSIS — S0083XA Contusion of other part of head, initial encounter: Secondary | ICD-10-CM | POA: Diagnosis not present

## 2023-10-29 DIAGNOSIS — Y92009 Unspecified place in unspecified non-institutional (private) residence as the place of occurrence of the external cause: Secondary | ICD-10-CM

## 2023-10-29 DIAGNOSIS — W19XXXA Unspecified fall, initial encounter: Secondary | ICD-10-CM

## 2023-10-29 NOTE — Patient Instructions (Addendum)
Apply vaseline to abrasions on face 2x a day for 1 to 2 weeks until healed.    Watch for any signs of headache or light sensitivity and please follow up if there are concerns for these symptoms.  A concussion syndrome or symptoms can happen after any head injury - very minor and usually has head aches, pain to light in eyes, dizziness, too sleepy or poor sleep. The treatment for this is to rest the brain with less activity, less light and more resting. See handout   But since she did not pass out, there have not been any concerning sign or symptoms since injury I am not concerned for a brain injury.

## 2023-10-29 NOTE — Progress Notes (Signed)
Patient ID: Tonya Lawson, female    DOB: [birth year], 63 y.o.   MRN: [id number]  PCP: Danelle Berry, PA-C  Chief Complaint  Patient presents with   Recent Fall    Happened yesterday, fell and nose/mouth bleed.    Subjective:   Tonya Lawson is a 63 y.o. female, presents to clinic with CC of the following:  HPI  Fall/trip last night in her group home, not 100% observed but no suspected LOC.  Staff did hear hear stumble and then immediately pop back up unassisted.  She hit nose and lip on the back of a chair with some swelling and little bit of bleeding.  The pt was trying to comfort and reassure those around her.  She was examined by staff and vital signs were taken her blood pressure was initially elevated.  Besides swelling to her nose and upper lip there was no observed deformity or bruising.  No nosebleed or intraoral injury or bleeding.  She did not complain of any neck pain or headache.  She behaved 8 and slept normally the remainder of the evening and this morning is at her baseline.  2 caregivers with her today from the adult group home if questions about her swelling they have not put on her prescription compression stocking and are concerned about possible worsening of her lower extremity edema -  LE edema compression socks squeezing legs, she follows with vascular for LE edema/lymphedema   Again staff has observed the patient and there has been no change in her eating behavior patterns, sleeping, interaction with staff or others in the group home, no change to her gait and balance her activity level.  She does not appear to have any pain or light sensitivity  Patient Active Problem List   Diagnosis Date Noted   Lymphedema 08/24/2020   Swelling of limb 02/17/2020   Dyslipidemia 10/10/2018   Dental caries 10/10/2018   Overweight (BMI 25.0-29.9) 02/01/2018   DNR (do not resuscitate) 08/29/2017   Hypertension    Osteoporosis    Intellectual disability    Kyphosis of  thoracic region       Current Outpatient Medications:    acetaminophen (TYLENOL) 500 MG tablet, Take 1 tablet (500 mg total) by mouth every 6 (six) hours as needed., Disp: 30 tablet, Rfl: 0   Calcium Carbonate-Vitamin D 600-400 MG-UNIT tablet, Take 1 tablet by mouth 2 (two) times daily., Disp: 60 tablet, Rfl: 11   chlorhexidine (PERIDEX) 0.12 % solution, SMARTSIG:By Mouth, Disp: , Rfl:    cholecalciferol (VITAMIN D3) 25 MCG (1000 UNIT) tablet, Take 1,000 Units by mouth daily., Disp: , Rfl:    Cranberry 180 MG CAPS, Take by mouth daily., Disp: , Rfl:    Cranberry-Vitamin C-Vitamin E (CRANBERRY PLUS VITAMIN C) 4200-20-3 MG-MG-UNIT CAPS, TAKE 1 SOFTGEL BY MOUTH ONCE DAILY FOR URINARY HEALTH, Disp: 30 capsule, Rfl: 2   hydrOXYzine (VISTARIL) 25 MG capsule, TAKE 1 CAPSULET BY MOUTH DAILY EVERY 8 HOURS AS NEEDED FOR ANXIETY, CAN BE USEDBEFORE GOING TO DENTIST., Disp: 30 capsule, Rfl: 11   lisinopril-hydrochlorothiazide (ZESTORETIC) 10-12.5 MG tablet, TAKE 1/2 TABLET BY MOUTH DAILY, Disp: 15 tablet, Rfl: 2   loratadine (CLARITIN) 10 MG tablet, Take 1 tablet (10 mg total) by mouth daily as needed for allergies., Disp: 90 tablet, Rfl: 3   Multiple Vitamins-Minerals (GNP ONE DAILY WOMENS 50+) TABS, TAKE 1 TABLET BY MOUTH ONCE DAILY FOR SUPPLEMENT, Disp: 30 tablet, Rfl: 11   pravastatin (PRAVACHOL) 20 MG tablet,  TAKE 1 TABLET BY MOUTH DAILY, Disp: 31 tablet, Rfl: 11   vitamin B-12 (CYANOCOBALAMIN) 100 MCG tablet, TAKE 1 TABLET BY MOUTH ONCE DAILY FOR SUPPLEMENT, Disp: 30 tablet, Rfl: 11   No Known Allergies   Social History   Tobacco Use   Smoking status: Never   Smokeless tobacco: Never  Vaping Use   Vaping status: Never Used  Substance Use Topics   Alcohol use: No   Drug use: No      Chart Review Today: I personally reviewed active problem list, medication list, allergies, family history, social history, health maintenance, notes from last encounter, lab results, imaging with the  patient/caregiver today.   Review of Systems  Constitutional: Negative.   HENT: Negative.    Eyes: Negative.   Respiratory: Negative.    Cardiovascular: Negative.   Gastrointestinal: Negative.   Endocrine: Negative.   Genitourinary: Negative.   Musculoskeletal: Negative.   Skin: Negative.   Allergic/Immunologic: Negative.   Neurological: Negative.   Hematological: Negative.   Psychiatric/Behavioral: Negative.    All other systems reviewed and are negative.      Objective:   Vitals:   10/29/23 1326  BP: 122/84  Pulse: 71  Resp: 16  SpO2: 99%  Weight: 147 lb (66.7 kg)  Height: 4\' 11"  (1.499 m)    Body mass index is 29.69 kg/m.  Physical Exam Vitals and nursing note reviewed.  Constitutional:      Appearance: She is well-developed. She is not ill-appearing, toxic-appearing or diaphoretic.     Comments: Pt appears at her baseline, well appearing, alert and pleasant  HENT:     Head: Normocephalic. Abrasion present. No raccoon eyes, Battle's sign, contusion, masses, right periorbital erythema, left periorbital erythema or laceration. Hair is normal.     Jaw: There is normal jaw occlusion. No trismus, tenderness, swelling or malocclusion.     Comments: Upper central lip with small 2x 8mm abrasion with scab, no active bleeding, mild surrounding edema No intraoral injury to gums teeth or inner lips/gums, no tenderness with palpation    Right Ear: Hearing, tympanic membrane, ear canal and external ear normal.     Left Ear: Hearing, tympanic membrane, ear canal and external ear normal.     Nose: Mucosal edema and congestion present. No nasal deformity or laceration.     Right Nostril: No epistaxis or septal hematoma.     Left Nostril: No epistaxis or septal hematoma.     Comments: Abrasion to bridge of nose small, scabbed, roughly 3 x 8 mm with some mild nasal swelling and mild redness  No ttp No deformity    Mouth/Throat:     Mouth: Mucous membranes are moist. No injury  or oral lesions.     Pharynx: Oropharynx is clear. Uvula midline.  Eyes:     General: Lids are normal.        Right eye: No discharge.        Left eye: No discharge.     Extraocular Movements: Extraocular movements intact.     Conjunctiva/sclera: Conjunctivae normal.     Pupils: Pupils are equal, round, and reactive to light. Pupils are equal.  Neck:     Trachea: Trachea and phonation normal. No tracheal deviation.  Cardiovascular:     Rate and Rhythm: Normal rate and regular rhythm.  Pulmonary:     Effort: Pulmonary effort is normal. No respiratory distress.     Breath sounds: No stridor.  Musculoskeletal:        General:  Normal range of motion.     Cervical back: Normal range of motion and neck supple. No spinous process tenderness or muscular tenderness. Normal range of motion.  Skin:    General: Skin is warm and dry.     Findings: No rash.  Neurological:     Mental Status: She is alert.     Motor: No abnormal muscle tone.     Coordination: Coordination normal.  Psychiatric:        Behavior: Behavior normal. Behavior is cooperative.      Results for orders placed or performed in visit on 07/10/23  Lipid Profile   Collection Time: 07/10/23 10:38 AM  Result Value Ref Range   Cholesterol 158 <200 mg/dL   HDL 68 > OR = 50 mg/dL   Triglycerides 161 <096 mg/dL   LDL Cholesterol (Calc) 68 mg/dL (calc)   Total CHOL/HDL Ratio 2.3 <5.0 (calc)   Non-HDL Cholesterol (Calc) 90 <045 mg/dL (calc)  COMPLETE METABOLIC PANEL WITH GFR   Collection Time: 07/10/23 10:38 AM  Result Value Ref Range   Glucose, Bld 82 65 - 99 mg/dL   BUN 17 7 - 25 mg/dL   Creat 4.09 8.11 - 9.14 mg/dL   eGFR 81 > OR = 60 NW/GNF/6.21H0   BUN/Creatinine Ratio SEE NOTE: 6 - 22 (calc)   Sodium 140 135 - 146 mmol/L   Potassium 4.2 3.5 - 5.3 mmol/L   Chloride 103 98 - 110 mmol/L   CO2 29 20 - 32 mmol/L   Calcium 9.9 8.6 - 10.4 mg/dL   Total Protein 6.7 6.1 - 8.1 g/dL   Albumin 4.2 3.6 - 5.1 g/dL   Globulin  2.5 1.9 - 3.7 g/dL (calc)   AG Ratio 1.7 1.0 - 2.5 (calc)   Total Bilirubin 0.3 0.2 - 1.2 mg/dL   Alkaline phosphatase (APISO) 94 37 - 153 U/L   AST 17 10 - 35 U/L   ALT 14 6 - 29 U/L  CBC w/Diff/Platelet   Collection Time: 07/10/23 10:38 AM  Result Value Ref Range   WBC 10.2 3.8 - 10.8 Thousand/uL   RBC 5.01 3.80 - 5.10 Million/uL   Hemoglobin 14.1 11.7 - 15.5 g/dL   HCT 86.5 78.4 - 69.6 %   MCV 87.8 80.0 - 100.0 fL   MCH 28.1 27.0 - 33.0 pg   MCHC 32.0 32.0 - 36.0 g/dL   RDW 29.5 28.4 - 13.2 %   Platelets 403 (H) 140 - 400 Thousand/uL   MPV 9.4 7.5 - 12.5 fL   Neutro Abs 5,488 1,500 - 7,800 cells/uL   Absolute Lymphocytes 3,825 850 - 3,900 cells/uL   Absolute Monocytes 683 200 - 950 cells/uL   Eosinophils Absolute 133 15 - 500 cells/uL   Basophils Absolute 71 0 - 200 cells/uL   Neutrophils Relative % 53.8 %   Total Lymphocyte 37.5 %   Monocytes Relative 6.7 %   Eosinophils Relative 1.3 %   Basophils Relative 0.7 %  B12   Collection Time: 07/10/23 10:38 AM  Result Value Ref Range   Vitamin B-12 1,485 (H) 200 - 1,100 pg/mL  Vitamin D (25 hydroxy)   Collection Time: 07/10/23 10:38 AM  Result Value Ref Range   Vit D, 25-Hydroxy 35 30 - 100 ng/mL       Assessment & Plan:   1. Contusion of face, initial encounter (Primary) Abrasions to bridge of nose and upper lip, very small, with some associated mild swelling surrounding No deformity or intraoral signs of  trauma Advised vaseline be applied to abrasions 2x daily for 1-2 weeks until healed Can apply cold pack to nose prn if wanted  2. Fall at home, initial encounter Mechanical fall w/o LOC Possibly pt hit forehead in addition to nose and upperlip No ttp to forehead, orbital rims No LOC with fall or after She is behaving and appearing at her baseline Given more than 12 hours since fall and abrasion no concern for TBI Reviewed with caregivers possible signs and sx of concussion or post-concussive syndrome - however pt  is not exhibiting any concerning sx today - she is pleasant interactive, and at her baseline.  Reviewed wound care and recommended follow-up if she does seem to have any new symptoms develop - reviewed AVS with caregivers.       Danelle Berry, PA-C 10/29/23 1:38 PM

## 2023-11-09 IMAGING — MG MM DIGITAL SCREENING BILAT W/ TOMO AND CAD
6 of 10 series · 6 of 30 positions shown · non-contrast
Comparison: Previous exam(s).

CLINICAL DATA: Screening.

EXAM:
DIGITAL SCREENING BILATERAL MAMMOGRAM WITH TOMOSYNTHESIS AND CAD
TECHNIQUE: Bilateral screening digital craniocaudal and mediolateral oblique
mammograms were obtained. Bilateral screening digital breast
tomosynthesis was performed. The images were evaluated with
computer-aided detection.

[R CC synth-2D]
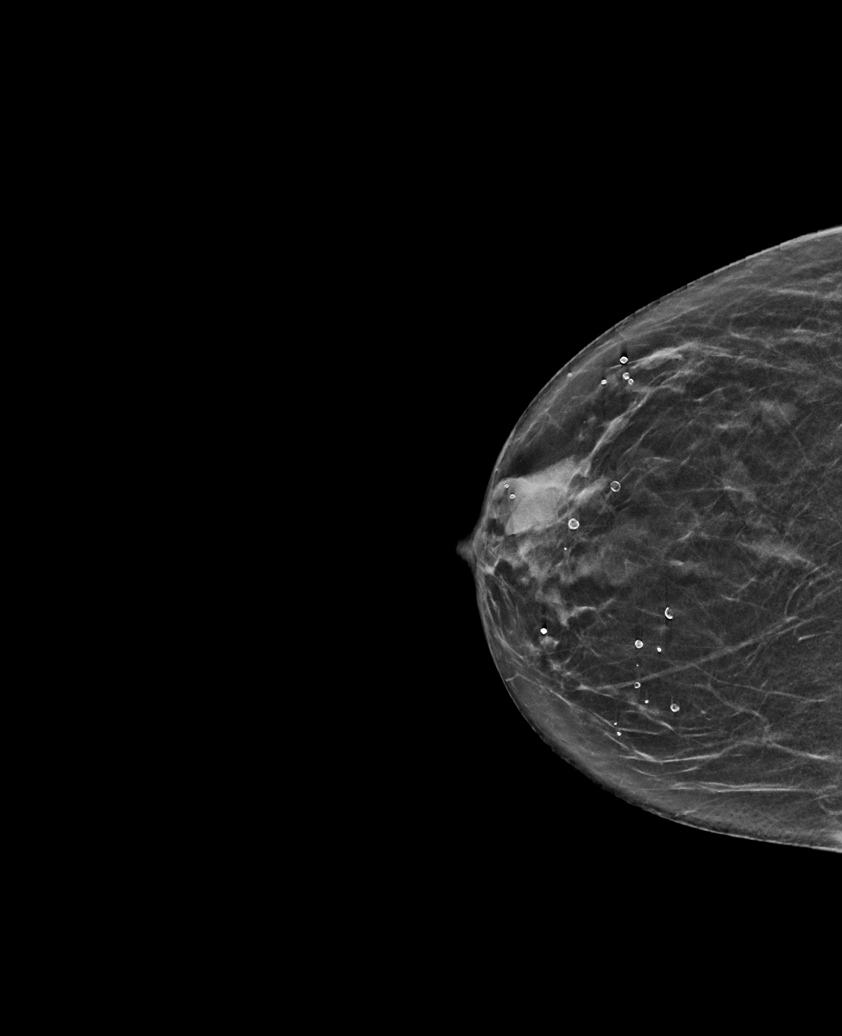

[R MLO synth-2D]
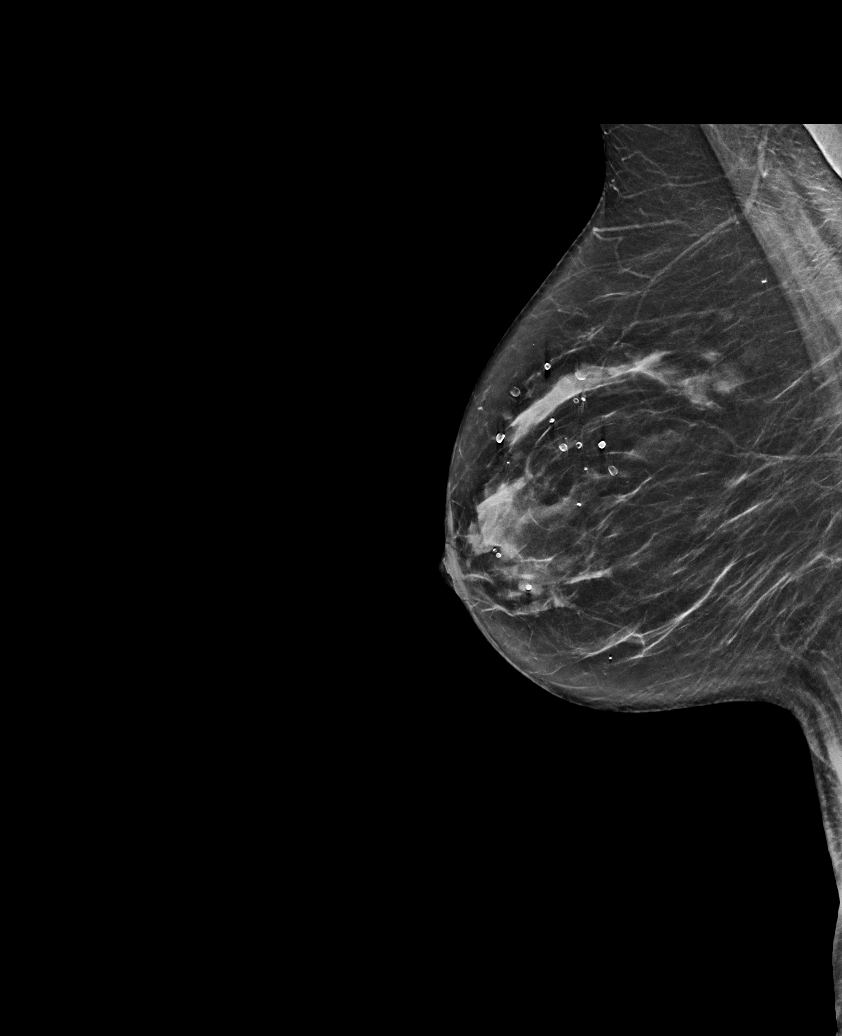

[L MLO synth-2D (1 of 2)]
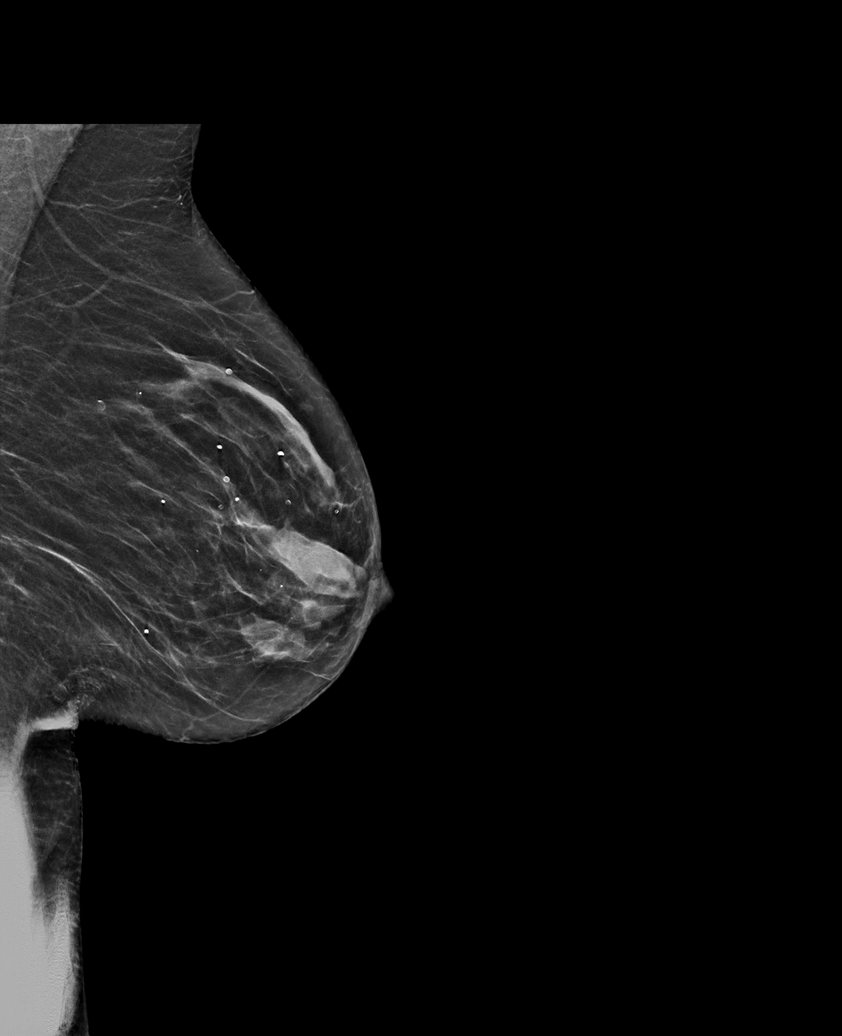

[L MLO synth-2D (2 of 2)]
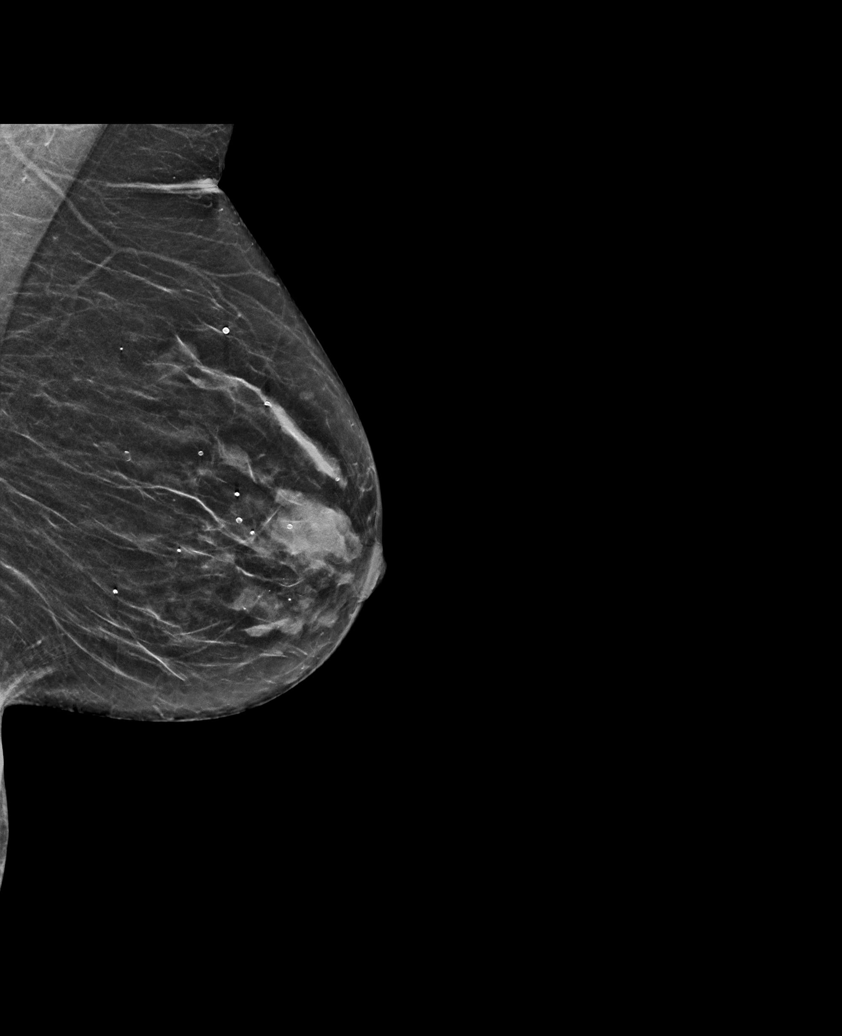

[L CC synth-2D]
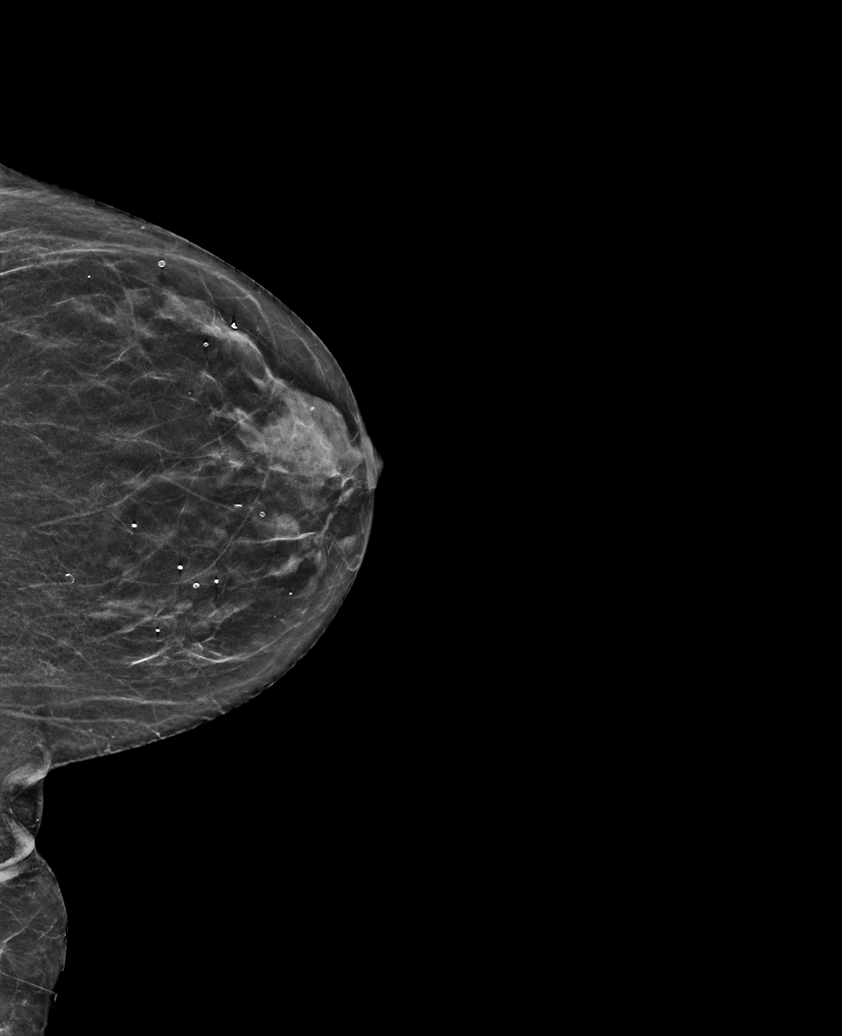

[R MLO tomo · tomo slice 31/60.0]
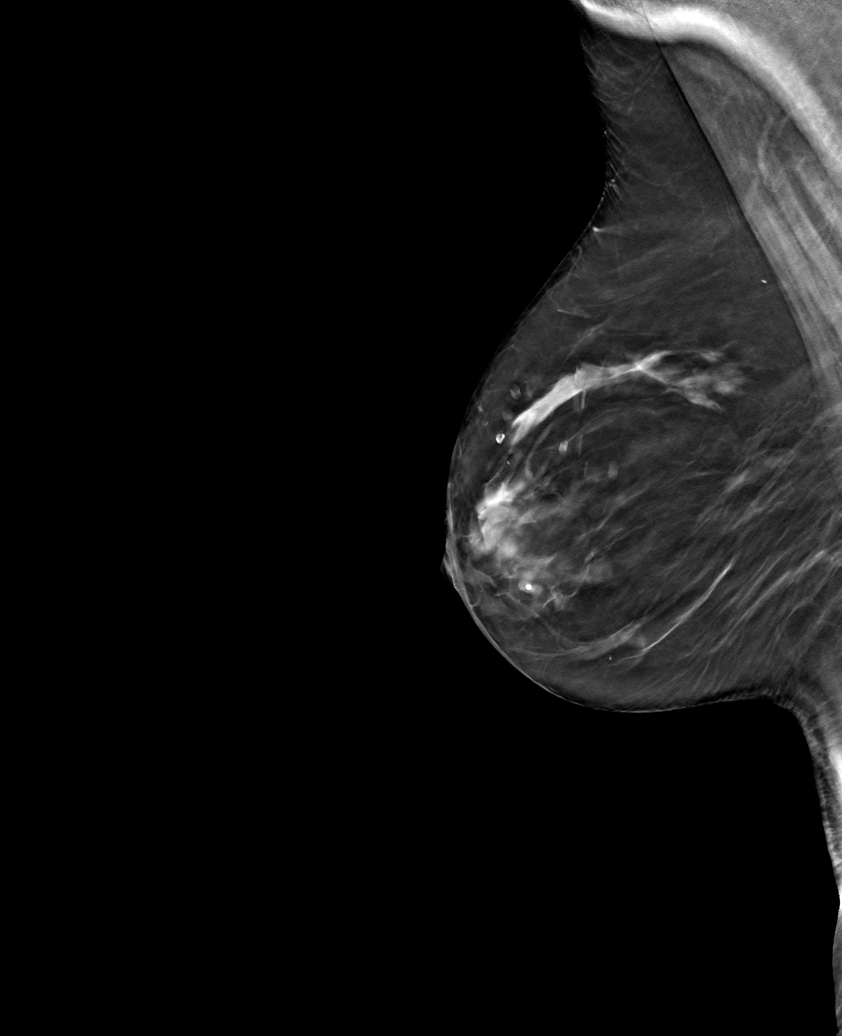

[6 of 30 positions shown; findings below may reference images not displayed]

ACR Breast Density Category b: There are scattered areas of
fibroglandular density.
FINDINGS: There are no findings suspicious for malignancy.
IMPRESSION: No mammographic evidence of malignancy. A result letter of this
screening mammogram will be mailed directly to the patient.

RECOMMENDATION:
Screening mammogram in one year. (Code:51-O-LD2)

BI-RADS CATEGORY  1: Negative.

## 2023-11-15 ENCOUNTER — Encounter: Payer: Self-pay | Admitting: Family Medicine

## 2023-11-19 ENCOUNTER — Other Ambulatory Visit: Payer: Self-pay | Admitting: Family Medicine

## 2023-11-19 DIAGNOSIS — Z1231 Encounter for screening mammogram for malignant neoplasm of breast: Secondary | ICD-10-CM

## 2023-11-22 ENCOUNTER — Other Ambulatory Visit: Payer: Self-pay | Admitting: Family Medicine

## 2023-11-22 DIAGNOSIS — I10 Essential (primary) hypertension: Secondary | ICD-10-CM

## 2023-12-14 ENCOUNTER — Ambulatory Visit
Admission: RE | Admit: 2023-12-14 | Discharge: 2023-12-14 | Disposition: A | Discharge status: Home / Self Care | Source: Ambulatory Visit | Attending: Family Medicine | Admitting: Family Medicine

## 2023-12-14 DIAGNOSIS — Z1231 Encounter for screening mammogram for malignant neoplasm of breast: Secondary | ICD-10-CM | POA: Diagnosis present

## 2023-12-17 ENCOUNTER — Encounter (INDEPENDENT_AMBULATORY_CARE_PROVIDER_SITE_OTHER): Payer: Self-pay | Admitting: Vascular Surgery

## 2024-01-15 ENCOUNTER — Ambulatory Visit: Payer: 59 | Admitting: Family Medicine

## 2024-01-22 ENCOUNTER — Other Ambulatory Visit: Payer: Self-pay | Admitting: Family Medicine

## 2024-01-22 DIAGNOSIS — E785 Hyperlipidemia, unspecified: Secondary | ICD-10-CM

## 2024-01-24 NOTE — Telephone Encounter (Signed)
 Requested Prescriptions  Pending Prescriptions Disp Refills   pravastatin  (PRAVACHOL ) 20 MG tablet [Pharmacy Med Name: PRAVASTATIN  SODIUM 20 MG TAB] 31 tablet 5    Sig: TAKE 1 TABLET BY MOUTH EVERY DAY     Cardiovascular:  Antilipid - Statins Failed - 01/24/2024 11:19 AM      Failed - Lipid Panel in normal range within the last 12 months    Cholesterol, Total  Date Value Ref Range Status  10/09/2016 226 (H) 100 - 199 mg/dL Final   Cholesterol  Date Value Ref Range Status  07/10/2023 158 <200 mg/dL Final   LDL Cholesterol (Calc)  Date Value Ref Range Status  07/10/2023 68 mg/dL (calc) Final    Comment:    Reference range: <100 . Desirable range <100 mg/dL for primary prevention;   <70 mg/dL for patients with CHD or diabetic patients  with > or = 2 CHD risk factors. Aaron Aas LDL-C is now calculated using the Martin-Hopkins  calculation, which is a validated novel method providing  better accuracy than the Friedewald equation in the  estimation of LDL-C.  Melinda Sprawls et al. Erroll Heard. 2952;841(32): 2061-2068  (http://education.[facility].com/faq/FAQ164)    HDL  Date Value Ref Range Status  07/10/2023 68 > OR = 50 mg/dL Final  44/09/270 49 >53 mg/dL Final   Triglycerides  Date Value Ref Range Status  07/10/2023 132 <150 mg/dL Final         Passed - Patient is not pregnant      Passed - Valid encounter within last 12 months    Recent Outpatient Visits           2 months ago Contusion of face, initial encounter   [facility] Adeline Hone, PA-C       Future Appointments             In 2 months Adeline Hone, PA-C [facility], [facility]

## 2024-01-29 ENCOUNTER — Encounter (INDEPENDENT_AMBULATORY_CARE_PROVIDER_SITE_OTHER): Payer: Self-pay

## 2024-02-01 ENCOUNTER — Ambulatory Visit

## 2024-02-01 ENCOUNTER — Telehealth: Payer: Self-pay | Admitting: Family Medicine

## 2024-02-01 VITALS — Ht 59.0 in | Wt 147.0 lb

## 2024-02-01 DIAGNOSIS — Z0000 Encounter for general adult medical examination without abnormal findings: Secondary | ICD-10-CM

## 2024-02-01 NOTE — Telephone Encounter (Signed)
 This patient thought she was being seen in the office. Please call caretaker back to resch this appointment.    [phone]

## 2024-02-01 NOTE — Patient Instructions (Signed)
 Tonya Lawson , Thank you for taking time out of your busy schedule to complete your Annual Wellness Visit with me. I enjoyed our conversation and look forward to speaking with you again next year. I, as well as your care team,  appreciate your ongoing commitment to your health goals. Please review the following plan we discussed and let me know if I can assist you in the future. Your Game plan/ To Do List    Follow up Visits: Next Medicare AWV with our clinical staff: 02/20/2025   Have you seen your provider in the last 6 months (3 months if uncontrolled diabetes)? Yes Next Office Visit with your provider: 03/22/2024  Clinician Recommendations:  Aim for 30 minutes of exercise or brisk walking, 6-8 glasses of water, and 5 servings of fruits and vegetables each day. Educated and advised on getting the COVID vaccine in 2025.      This is a list of the screening recommended for you and due dates:  Health Maintenance  Topic Date Due   COVID-19 Vaccine (5 - 2024-25 season) 05/13/2023   Flu Shot  04/11/2024   Mammogram  12/13/2024   DEXA scan (bone density measurement)  01/25/2025   Medicare Annual Wellness Visit  01/31/2025   DTaP/Tdap/Td vaccine (3 - Td or Tdap) 12/14/2027   Colon Cancer Screening  11/23/2029   Hepatitis C Screening  Completed   HIV Screening  Completed   Zoster (Shingles) Vaccine  Completed   Pneumococcal Vaccination  Aged Out   HPV Vaccine  Aged Out   Meningitis B Vaccine  Aged Out   Cologuard (Stool DNA test)  Discontinued    Advanced directives: (Copy Requested) Please bring a copy of your health care power of attorney and living will to the office to be added to your chart at your convenience. You can mail to [facility] [address] [place], [place] [address] or email to [email or web address] Advance Care Planning is important because it:  [x]  Makes sure you receive the medical care that is consistent with your values, goals, and  preferences  [x]  It provides guidance to your family and loved ones and reduces their decisional burden about whether or not they are making the right decisions based on your wishes.  Follow the link provided in your after visit summary or read over the paperwork we have mailed to you to help you started getting your Advance Directives in place. If you need assistance in completing these, please reach out to us  so that we can help you!

## 2024-02-01 NOTE — Progress Notes (Signed)
 Subjective:   Tonya Lawson is a 63 y.o. who presents for a Medicare Wellness preventive visit.  As a reminder, Annual Wellness Visits don't include a physical exam, and some assessments may be limited, especially if this visit is performed virtually. We may recommend an in-person follow-up visit with your provider if needed.  Visit Complete: Virtual I connected with  Tonya Lawson on 02/01/24 by a audio enabled telemedicine application and verified that I am speaking with the correct person using two identifiers.  Patient Location: Home  Provider Location: Office/Clinic  I discussed the limitations of evaluation and management by telemedicine. The patient expressed understanding and agreed to proceed.  Vital Signs: Because this visit was a virtual/telehealth visit, some criteria may be missing or patient reported. Any vitals not documented were not Lawson to be obtained and vitals that have been documented are patient reported.  VideoDeclined- This patient declined Librarian, academic. Therefore the visit was completed with audio only.  Persons Participating in Visit: Patient assisted by Caregiver, Tonya Lawson.  AWV Questionnaire: No: Patient Medicare AWV questionnaire was not completed prior to this visit.  Cardiac Risk Factors include: advanced age (>40men, >30 women);dyslipidemia;hypertension     Objective:     Today's Vitals   02/01/24 1210  Weight: 147 lb (66.7 kg)  Height: 4\' 11"  (1.499 m)   Body mass index is 29.69 kg/m.     02/01/2024   12:09 PM 02/23/2023    9:36 AM 01/24/2022   12:30 PM 11/24/2019    9:18 AM 10/24/2019   11:28 AM 10/10/2018   11:29 AM 04/09/2017   10:09 AM  Advanced Directives  Does Patient Have a Medical Advance Directive? Yes Yes -- Yes Unable to assess, patient is non-responsive or altered mental status;Yes Yes No  Type of Estate agent of [place];Living will Healthcare Power of  [place];Living will  Out of facility DNR (pink MOST or yellow form) Healthcare Power of [place];Out of facility DNR (pink MOST or yellow form);Living will Out of facility DNR (pink MOST or yellow form)   Copy of Healthcare Power of Attorney in Chart? No - copy requested        Pre-existing out of facility DNR order (yellow form or pink MOST form)      Pink MOST form placed in chart (order not valid for inpatient use)     Current Medications (verified) Outpatient Encounter Medications as of 02/01/2024  Medication Sig   acetaminophen  (TYLENOL ) 500 MG tablet Take 1 tablet (500 mg total) by mouth every 6 (six) hours as needed.   Calcium  Carbonate-Vitamin D  600-400 MG-UNIT tablet Take 1 tablet by mouth 2 (two) times daily.   chlorhexidine (PERIDEX) 0.12 % solution SMARTSIG:By Mouth   cholecalciferol (VITAMIN D3) 25 MCG (1000 UNIT) tablet Take 1,000 Units by mouth daily.   Cranberry 180 MG CAPS Take by mouth daily.   CRANBERRY PLUS VITAMIN C  4200-20-3 MG-UNIT CAPS TAKE 1 SOFTGEL BY MOUTH ONCE DAILY FOR URINARY HEALTH   hydrOXYzine  (VISTARIL ) 25 MG capsule TAKE 1 CAPSULET BY MOUTH DAILY EVERY 8 HOURS AS NEEDED FOR ANXIETY, CAN BE USEDBEFORE GOING TO DENTIST.   lisinopril -hydrochlorothiazide  (ZESTORETIC ) 10-12.5 MG tablet TAKE 1/2 TABLET BY MOUTH ONCE DAILY   loratadine  (CLARITIN ) 10 MG tablet Take 1 tablet (10 mg total) by mouth daily as needed for allergies.   Multiple Vitamins-Minerals (GNP ONE DAILY WOMENS 50+) TABS TAKE 1 TABLET BY MOUTH ONCE DAILY FOR SUPPLEMENT   pravastatin  (PRAVACHOL ) 20 MG  tablet TAKE 1 TABLET BY MOUTH EVERY DAY   vitamin B-12 (CYANOCOBALAMIN ) 100 MCG tablet TAKE 1 TABLET BY MOUTH ONCE DAILY FOR SUPPLEMENT   No facility-administered encounter medications on file as of 02/01/2024.    Allergies (verified) Patient has no known allergies.   History: Past Medical History:  Diagnosis Date   DNR (do not resuscitate) 08/29/2017   Hypercholesterolemia    Hypertension     Kyphosis of thoracic region    Mental retardation    Osteoporosis    Past Surgical History:  Procedure Laterality Date   ABDOMINAL HYSTERECTOMY     completed   COLONOSCOPY WITH PROPOFOL  N/A 11/24/2019   Procedure: COLONOSCOPY WITH PROPOFOL ;  Surgeon: Luke Salaam, MD;  [facility] END[facility]COPY;  Service: Gastroenterology;  Laterality: N/A;   Family History  Problem Relation Age of Onset   Diabetes Mother    Stroke Mother    Cancer Father        brain   Breast cancer Maternal Aunt 87   Cancer Brother    Social History   Socioeconomic History   Marital status: Single    Spouse name: Not on file   Number of children: 0   Years of education: Not on file   Highest education level: Not on file  Occupational History   Not on file  Tobacco Use   Smoking status: Never   Smokeless tobacco: Never  Vaping Use   Vaping status: Never Used  Substance and Sexual Activity   Alcohol use: No   Drug use: No   Sexual activity: Never  Other Topics Concern   Not on file  Social History Narrative   Patient is in a care home with Tonya Lawson.    Social Drivers of Corporate investment banker Strain: Low Risk  (02/01/2024)   Overall Financial Resource Strain (CARDIA)    Difficulty of Paying Living Expenses: Not hard at all  Food Insecurity: No Food Insecurity (02/01/2024)   Hunger Vital Sign    Worried About Running Out of Food in the Last Year: Never true    Ran Out of Food in the Last Year: Never true  Transportation Needs: No Transportation Needs (02/01/2024)   PRAPARE - Administrator, Civil Service (Medical): No    Lack of Transportation (Non-Medical): No  Physical Activity: Sufficiently Active (02/01/2024)   Exercise Vital Sign    Days of Exercise per Week: 5 days    Minutes of Exercise per Session: 60 min  Stress: No Stress Concern Present (02/01/2024)   [employer] of Occupational Health - Occupational Stress Questionnaire    Feeling of Stress : Not at all   Social Connections: Moderately Integrated (02/01/2024)   Social Connection and Isolation Panel [NHANES]    Frequency of Communication with Friends and Family: More than three times a week    Frequency of Social Gatherings with Friends and Family: More than three times a week    Attends Religious Services: 1 to 4 times per year    Active Member of [employer] or Organizations: Yes    Attends Banker Meetings: Never    Marital Status: Never married    Tobacco Counseling Counseling given: No    Clinical Intake:  Pre-visit preparation completed: Yes  Pain : No/denies pain     BMI - recorded: 29.69 Nutritional Status: BMI 25 -29 Overweight Nutritional Risks: None Diabetes: No  Lab Results  Component Value Date   HGBA1C 5.4 01/08/2023  How often do you need to have someone help you when you read instructions, pamphlets, or other written materials from your doctor or pharmacy?: 5 - Always (Caregiver assists)  Interpreter Needed?: No  Information entered by :: Kandy Orris, CMA   Activities of Daily Living     02/01/2024   12:13 PM 07/10/2023    9:40 AM  In your present state of health, do you have any difficulty performing the following activities:  Hearing? 0 0  Vision? 0 0  Difficulty concentrating or making decisions? 0 0  Walking or climbing stairs? 0 0  Dressing or bathing? 0 0  Doing errands, shopping? 0 0  Preparing Food and eating ? N   Using the Toilet? N   In the past six months, have you accidently leaked urine? N   Do you have problems with loss of bowel control? N   Managing your Medications? N   Comment per Caregiver   Managing your Finances? Y   Comment [employer] or managing your Housekeeping? Y   Comment Caregiver assists     Patient Care Team: Tapia, Leisa, PA-C as PCP - General (Family Medicine)  Indicate any recent Medical Services you may have received from other than [facility] providers in the past year  (date may be approximate).     Assessment:    This is a routine wellness examination for [place].  Hearing/Vision screen Hearing Screening - Comments:: Denies hearing difficulties  per Caregiver Vision Screening - Comments:: Denies vision concerns - Dr Rosan Comfort   Goals Addressed               This Visit's Progress     Patient Stated (pt-stated)        Patient and Caregiver stated plans to keep living       Depression Screen     02/01/2024   12:15 PM 07/10/2023    9:40 AM 02/23/2023    9:29 AM 01/08/2023    8:55 AM 11/09/2022   10:16 AM 07/10/2022    8:55 AM 01/24/2022   12:29 PM  PHQ 2/9 Scores  PHQ - 2 Score 0 0 0 0 0 0 0  PHQ- 9 Score 0 0  0 0 0     Fall Risk     02/01/2024   12:15 PM 10/29/2023    1:26 PM 07/10/2023    9:40 AM 02/23/2023    9:26 AM 01/08/2023    8:55 AM  Fall Risk   Falls in the past year? 0 1 0 0 0  Number falls in past yr: 0 0 0 0 0  Injury with Fall? 0 1 0 0 0  Risk for fall due to : No Fall Risks No Fall Risks No Fall Risks No Fall Risks   Follow up Falls evaluation completed;Falls prevention discussed Falls prevention discussed Falls prevention discussed;Education provided;Falls evaluation completed Education provided;Falls prevention discussed     MEDICARE RISK AT HOME:  Medicare Risk at Home Any stairs in or around the home?: No If so, are there any without handrails?: No Home free of loose throw rugs in walkways, pet beds, electrical cords, etc?: Yes Adequate lighting in your home to reduce risk of falls?: Yes Life alert?: Yes Use of a cane, walker or w/c?: No Grab bars in the bathroom?: Yes Shower chair or bench in shower?: Yes Elevated toilet seat or a handicapped toilet?: Yes  TIMED UP AND GO:  Was the test performed?  No  Cognitive Function: Declined/Normal: No cognitive concerns noted by patient or family. Patient alert, oriented, Lawson to answer questions appropriately and recall recent events. No signs of memory loss or  confusion.    02/01/2024   12:18 PM  MMSE - Mini Mental State Exam  Not completed: Refused        08/29/2017   11:28 AM  6CIT Screen  What Year? 4 points  What month? 3 points  What time? 3 points  Count back from 20 4 points  Months in reverse 4 points  Repeat phrase 10 points  Total Score 28 points    Immunizations Immunization History  Administered Date(s) Administered   Influenza, Seasonal, Injecte, Preservative Fre 07/10/2023   Influenza,inj,Quad PF,6+ Mos 08/02/2015, 07/12/2016, 06/28/2017, 06/17/2018, 06/09/2019, 07/07/2020, 07/08/2021, 07/10/2022   Moderna Covid Bivalent Peds Booster(33mo Thru 3yrs) 07/19/2021   Moderna Sars-Covid-2 Vaccination 10/15/2019, 11/13/2019, 07/19/2020   PNEUMOCOCCAL CONJUGATE-20 07/10/2022   Pneumococcal Polysaccharide-23 06/23/2000, 07/24/2005   Td 06/20/2005   Tdap 12/13/2017   Zoster Recombinant(Shingrix) 07/23/2020, 12/21/2021   Zoster, Live 09/17/2013    Screening Tests Health Maintenance  Topic Date Due   COVID-19 Vaccine (5 - 2024-25 season) 05/13/2023   INFLUENZA VACCINE  04/11/2024   MAMMOGRAM  12/13/2024   DEXA SCAN  01/25/2025   Medicare Annual Wellness (AWV)  01/31/2025   DTaP/Tdap/Td (3 - Td or Tdap) 12/14/2027   Colonoscopy  11/23/2029   Hepatitis C Screening  Completed   HIV Screening  Completed   Zoster Vaccines- Shingrix  Completed   Pneumococcal Vaccine 62-57 Years old  Aged Out   HPV VACCINES  Aged Out   Meningococcal B Vaccine  Aged Out   Fecal DNA (Cologuard)  Discontinued    Health Maintenance  Health Maintenance Due  Topic Date Due   COVID-19 Vaccine (5 - 2024-25 season) 05/13/2023   Health Maintenance Items Addressed: 02/01/2024   Additional Screening:  Vision Screening: Recommended annual ophthalmology exams for early detection of glaucoma and other disorders of the eye.  Dental Screening: Recommended annual dental exams for proper oral hygiene  Community Resource Referral / Chronic Care  Management: CRR required this visit?  No   CCM required this visit?  No   Plan:    I have personally reviewed and noted the following in the patient's chart:   Medical and social history Use of alcohol, tobacco or illicit drugs  Current medications and supplements including opioid prescriptions. Patient is not currently taking opioid prescriptions. Functional ability and status Nutritional status Physical activity Advanced directives List of other physicians Hospitalizations, surgeries, and ER visits in previous 12 months Vitals Screenings to include cognitive, depression, and falls Referrals and appointments  In addition, I have reviewed and discussed with patient certain preventive protocols, quality metrics, and best practice recommendations. A written personalized care plan for preventive services as well as general preventive health recommendations were provided to patient.   Patria Bookbinder, CMA   02/01/2024   After Visit Summary: (MyChart) Due to this being a telephonic visit, the after visit summary with patients personalized plan was offered to patient via MyChart   Notes: Nothing significant to report at this time.

## 2024-02-20 ENCOUNTER — Other Ambulatory Visit: Payer: Self-pay | Admitting: Family Medicine

## 2024-02-20 DIAGNOSIS — J302 Other seasonal allergic rhinitis: Secondary | ICD-10-CM

## 2024-02-21 NOTE — Telephone Encounter (Signed)
 Requested medications are due for refill today.  yes  Requested medications are on the active medications list.  yes  Last refill. 01/06/2021 #90 3 rf  Future visit scheduled.   yes  Notes to clinic.  Pt is also taking Hydralazine.Please review for refill.    Requested Prescriptions  Pending Prescriptions Disp Refills   loratadine  (CLARITIN ) 10 MG tablet [Pharmacy Med Name: LORATADINE  10 MG TAB] 90 tablet 3    Sig: TAKE 1 TABLET BY MOUTH DAILY AS NEEDED FOR ALLERGIES     Ear, Nose, and Throat:  Antihistamines 2 Passed - 02/21/2024  4:11 PM      Passed - Cr in normal range and within 360 days    Creat  Date Value Ref Range Status  07/10/2023 0.82 0.50 - 1.05 mg/dL Final         Passed - Valid encounter within last 12 months    Recent Outpatient Visits           3 months ago Contusion of face, initial encounter   [facility] Adeline Hone, PA-C       Future Appointments             In 2 weeks Adeline Hone, PA-C [facility]   In 1 month Adeline Hone, PA-C [facility]

## 2024-02-25 ENCOUNTER — Other Ambulatory Visit: Payer: Self-pay | Admitting: Family Medicine

## 2024-02-25 DIAGNOSIS — I10 Essential (primary) hypertension: Secondary | ICD-10-CM

## 2024-02-27 NOTE — Telephone Encounter (Signed)
 Requested medication (s) are due for refill today: Yes  Requested medication (s) are on the active medication list: Yes  Last refill:  11/23/23  Future visit scheduled: Yes  Notes to clinic:  Unable to refill due to no refill protocol for this medication.      Requested Prescriptions  Pending Prescriptions Disp Refills   CRANBERRY PLUS VITAMIN C  4200-20-3 MG-UNIT CAPS [Pharmacy Med Name: CRANBERRY PLUS VITAMIN C  4200-20-3] 30 capsule 2    Sig: TAKE 1 SOFTGEL BY MOUTH ONCE DAILY FOR URINARY HEALTH     There is no refill protocol information for this order    Signed Prescriptions Disp Refills   lisinopril -hydrochlorothiazide  (ZESTORETIC ) 10-12.5 MG tablet 15 tablet 2    Sig: TAKE 1/2 TABLET BY MOUTH ONCE DAILY     Cardiovascular:  ACEI + Diuretic Combos Failed - 02/27/2024  4:49 PM      Failed - Na in normal range and within 180 days    Sodium  Date Value Ref Range Status  07/10/2023 140 135 - 146 mmol/L Final  10/09/2016 143 134 - 144 mmol/L Final         Failed - K in normal range and within 180 days    Potassium  Date Value Ref Range Status  07/10/2023 4.2 3.5 - 5.3 mmol/L Final         Failed - Cr in normal range and within 180 days    Creat  Date Value Ref Range Status  07/10/2023 0.82 0.50 - 1.05 mg/dL Final         Failed - eGFR is 30 or above and within 180 days    GFR, Est African American  Date Value Ref Range Status  01/06/2021 82 > OR = 60 mL/min/1.64m2 Final   GFR, Est Non African American  Date Value Ref Range Status  01/06/2021 71 > OR = 60 mL/min/1.62m2 Final   eGFR  Date Value Ref Range Status  07/10/2023 81 > OR = 60 mL/min/1.40m2 Final         Passed - Patient is not pregnant      Passed - Last BP in normal range    BP Readings from Last 1 Encounters:  10/29/23 122/84         Passed - Valid encounter within last 6 months    Recent Outpatient Visits           4 months ago Contusion of face, initial encounter   [facility]  [facility] Adeline Hone, PA-C       Future Appointments             In 1 week Adeline Hone, PA-C [facility]   In 1 month Adeline Hone, PA-C [facility], [facility]

## 2024-03-05 ENCOUNTER — Encounter (INDEPENDENT_AMBULATORY_CARE_PROVIDER_SITE_OTHER): Payer: Self-pay | Admitting: Nurse Practitioner

## 2024-03-05 ENCOUNTER — Ambulatory Visit (INDEPENDENT_AMBULATORY_CARE_PROVIDER_SITE_OTHER): Payer: Medicaid Other | Admitting: Nurse Practitioner

## 2024-03-05 VITALS — BP 130/86 | HR 88 | Ht 59.0 in | Wt 147.8 lb

## 2024-03-05 DIAGNOSIS — I890 Lymphedema, not elsewhere classified: Secondary | ICD-10-CM | POA: Diagnosis not present

## 2024-03-05 DIAGNOSIS — E785 Hyperlipidemia, unspecified: Secondary | ICD-10-CM

## 2024-03-05 DIAGNOSIS — I10 Essential (primary) hypertension: Secondary | ICD-10-CM | POA: Diagnosis not present

## 2024-03-05 NOTE — Progress Notes (Signed)
 Subjective:    Patient ID: Tonya Lawson, female    DOB: [birth year], 63 y.o.   MRN: [id number] Chief Complaint  Patient presents with   Follow-up     1 year no studies. see jd/fb     The patient returns to the office for followup evaluation regarding leg swelling.  The patient is a poor historian but she does have 2 attendants today which provide much of her history.  The swelling has persisted but with the lymph pump is under much, much better controlled. The pain associated with swelling is decreased. There have not been any interval development of a ulcerations or wounds.  The patient denies problems with the pump, noting it is working well and the leggings are in good condition.  However, the patient does not always wish to wear them and so use is sporadic.  She does continue to wear her compression stockings daily and today her swelling is under very good control.  Patient stated the lymph pump has been helpful with the treatment of the lymphedema when utilized.       Review of Systems  Cardiovascular:  Positive for leg swelling.  All other systems reviewed and are negative.      Objective:   Physical Exam Vitals reviewed.  HENT:     Head: Normocephalic.   Cardiovascular:     Rate and Rhythm: Normal rate.  Pulmonary:     Effort: Pulmonary effort is normal.   Musculoskeletal:     Right lower leg: Edema present.     Left lower leg: Edema present.   Skin:    General: Skin is warm and dry.   Neurological:     Mental Status: She is alert and oriented to person, place, and time.   Psychiatric:        Mood and Affect: Mood normal.        Behavior: Behavior normal.        Thought Content: Thought content normal.        Judgment: Judgment normal.     BP 130/86   Pulse 88   Ht 4' 11 (1.499 m)   Wt 147 lb 12.8 oz (67 kg)   BMI 29.85 kg/m   Past Medical History:  Diagnosis Date   DNR (do not resuscitate) 08/29/2017   Hypercholesterolemia     Hypertension    Kyphosis of thoracic region    Mental retardation    Osteoporosis     Social History   Socioeconomic History   Marital status: Single    Spouse name: Not on file   Number of children: 0   Years of education: Not on file   Highest education level: Not on file  Occupational History   Not on file  Tobacco Use   Smoking status: Never   Smokeless tobacco: Never  Vaping Use   Vaping status: Never Used  Substance and Sexual Activity   Alcohol use: No   Drug use: No   Sexual activity: Never  Other Topics Concern   Not on file  Social History Narrative   Patient is in a care home with Elgin Hamilton.    Social Drivers of Corporate investment banker Strain: Low Risk  (02/01/2024)   Overall Financial Resource Strain (CARDIA)    Difficulty of Paying Living Expenses: Not hard at all  Food Insecurity: No Food Insecurity (02/01/2024)   Hunger Vital Sign    Worried About Running Out of Food in the Last Year: Never  true    Ran Out of Food in the Last Year: Never true  Transportation Needs: No Transportation Needs (02/01/2024)   PRAPARE - Administrator, Civil Service (Medical): No    Lack of Transportation (Non-Medical): No  Physical Activity: Sufficiently Active (02/01/2024)   Exercise Vital Sign    Days of Exercise per Week: 5 days    Minutes of Exercise per Session: 60 min  Stress: No Stress Concern Present (02/01/2024)   [employer] of Occupational Health - Occupational Stress Questionnaire    Feeling of Stress : Not at all  Social Connections: Moderately Integrated (02/01/2024)   Social Connection and Isolation Panel    Frequency of Communication with Friends and Family: More than three times a week    Frequency of Social Gatherings with Friends and Family: More than three times a week    Attends Religious Services: 1 to 4 times per year    Active Member of [employer] or Organizations: Yes    Attends Banker Meetings: Never    Marital  Status: Never married  Intimate Partner Violence: Not At Risk (02/01/2024)   Humiliation, Afraid, Rape, and Kick questionnaire    Fear of Current or Ex-Partner: No    Emotionally Abused: No    Physically Abused: No    Sexually Abused: No    Past Surgical History:  Procedure Laterality Date   ABDOMINAL HYSTERECTOMY     completed   COLONOSCOPY WITH PROPOFOL  N/A 11/24/2019   Procedure: COLONOSCOPY WITH PROPOFOL ;  Surgeon: Therisa Bi, MD;  Location: [facility] ENDOSCOPY;  Service: Gastroenterology;  Laterality: N/A;    Family History  Problem Relation Age of Onset   Diabetes Mother    Stroke Mother    Cancer Father        brain   Breast cancer Maternal Aunt 35   Cancer Brother     No Known Allergies     Latest Ref Rng & Units 07/10/2023   10:38 AM 01/08/2023   10:42 AM 07/10/2022   10:37 AM  CBC  WBC 3.8 - 10.8 Thousand/uL 10.2  9.2  8.6   Hemoglobin 11.7 - 15.5 g/dL 85.8  86.4  85.2   Hematocrit 35.0 - 45.0 % 44.0  40.8  43.9   Platelets 140 - 400 Thousand/uL 403  386  388       CMP     Component Value Date/Time   NA 140 07/10/2023 1038   NA 143 10/09/2016 1207   K 4.2 07/10/2023 1038   CL 103 07/10/2023 1038   CO2 29 07/10/2023 1038   GLUCOSE 82 07/10/2023 1038   BUN 17 07/10/2023 1038   BUN 13 10/09/2016 1207   CREATININE 0.82 07/10/2023 1038   CALCIUM  9.9 07/10/2023 1038   PROT 6.7 07/10/2023 1038   PROT 6.5 10/09/2016 1207   ALBUMIN 4.1 04/09/2017 1113   ALBUMIN 4.1 10/09/2016 1207   AST 17 07/10/2023 1038   ALT 14 07/10/2023 1038   ALKPHOS 87 04/09/2017 1113   BILITOT 0.3 07/10/2023 1038   BILITOT 0.2 10/09/2016 1207   GFRNONAA 71 01/06/2021 1121   GFRAA 82 01/06/2021 1121     No results found.     Assessment & Plan:   1. Lymphedema There is concern that the nurses wish to discontinue use of her lymphedema pump because the patient refuses at times.  I have recommended that he utilize it when she is agreeable to utilize as sporadic use of her  lymphedema pump  is much more beneficial than not using it altogether.  We also strongly recommend that she continue to utilize her medical grade compression stockings daily.  We have also given a prescription so that they can update these compression socks.  She is also advised to continue to engage with conservative therapy including activity of 15 to 20 minutes 3 to 4 days/week as well as elevation when not active.  Based on how well she is doing we can plan on having her return in 1 year  2. Primary hypertension Continue antihypertensive medications as already ordered, these medications have been reviewed and there are no changes at this time.   3. Dyslipidemia Continue statin as ordered and reviewed, no changes at this time    Current Outpatient Medications on File Prior to Visit  Medication Sig Dispense Refill   acetaminophen  (TYLENOL ) 500 MG tablet Take 1 tablet (500 mg total) by mouth every 6 (six) hours as needed. 30 tablet 0   Calcium  Carbonate-Vitamin D  600-400 MG-UNIT tablet Take 1 tablet by mouth 2 (two) times daily. 60 tablet 11   chlorhexidine (PERIDEX) 0.12 % solution SMARTSIG:By Mouth     cholecalciferol (VITAMIN D3) 25 MCG (1000 UNIT) tablet Take 1,000 Units by mouth daily.     Cranberry 180 MG CAPS Take by mouth daily.     CRANBERRY PLUS VITAMIN C  4200-20-3 MG-UNIT CAPS TAKE 1 SOFTGEL BY MOUTH ONCE DAILY FOR URINARY HEALTH 30 capsule 2   hydrOXYzine  (VISTARIL ) 25 MG capsule TAKE 1 CAPSULET BY MOUTH DAILY EVERY 8 HOURS AS NEEDED FOR ANXIETY, CAN BE USEDBEFORE GOING TO DENTIST. 30 capsule 11   lisinopril -hydrochlorothiazide  (ZESTORETIC ) 10-12.5 MG tablet TAKE 1/2 TABLET BY MOUTH ONCE DAILY 15 tablet 2   loratadine  (CLARITIN ) 10 MG tablet TAKE 1 TABLET BY MOUTH DAILY AS NEEDED FOR ALLERGIES 90 tablet 3   Multiple Vitamins-Minerals (GNP ONE DAILY WOMENS 50+) TABS TAKE 1 TABLET BY MOUTH ONCE DAILY FOR SUPPLEMENT 30 tablet 11   pravastatin  (PRAVACHOL ) 20 MG tablet TAKE 1 TABLET BY  MOUTH EVERY DAY 31 tablet 5   vitamin B-12 (CYANOCOBALAMIN ) 100 MCG tablet TAKE 1 TABLET BY MOUTH ONCE DAILY FOR SUPPLEMENT 30 tablet 11   No current facility-administered medications on file prior to visit.    There are no Patient Instructions on file for this visit. No follow-ups on file.   Herrick Hartog E Jaylenne Hamelin, NP

## 2024-03-07 ENCOUNTER — Ambulatory Visit (INDEPENDENT_AMBULATORY_CARE_PROVIDER_SITE_OTHER): Admitting: Family Medicine

## 2024-03-07 ENCOUNTER — Encounter: Payer: Self-pay | Admitting: Family Medicine

## 2024-03-07 VITALS — BP 126/72 | HR 109 | Resp 16 | Ht 59.0 in | Wt 148.0 lb

## 2024-03-07 DIAGNOSIS — M810 Age-related osteoporosis without current pathological fracture: Secondary | ICD-10-CM

## 2024-03-07 DIAGNOSIS — Z0000 Encounter for general adult medical examination without abnormal findings: Secondary | ICD-10-CM

## 2024-03-07 DIAGNOSIS — I10 Essential (primary) hypertension: Secondary | ICD-10-CM | POA: Diagnosis not present

## 2024-03-07 DIAGNOSIS — E785 Hyperlipidemia, unspecified: Secondary | ICD-10-CM | POA: Diagnosis not present

## 2024-03-07 DIAGNOSIS — N1831 Chronic kidney disease, stage 3a: Secondary | ICD-10-CM | POA: Insufficient documentation

## 2024-03-07 DIAGNOSIS — E663 Overweight: Secondary | ICD-10-CM | POA: Diagnosis not present

## 2024-03-07 DIAGNOSIS — F79 Unspecified intellectual disabilities: Secondary | ICD-10-CM

## 2024-03-07 NOTE — Patient Instructions (Signed)
 Health Maintenance  Topic Date Due   Flu Shot  04/11/2024   Mammogram  12/13/2024   DEXA scan (bone density measurement)  01/25/2025   Medicare Annual Wellness Visit  01/31/2025   DTaP/Tdap/Td vaccine (3 - Td or Tdap) 12/14/2027   Colon Cancer Screening  11/23/2029   COVID-19 Vaccine  Completed   Hepatitis C Screening  Completed   HIV Screening  Completed   Zoster (Shingles) Vaccine  Completed   Pneumococcal Vaccination  Aged Out   Hepatitis B Vaccine  Aged Out   HPV Vaccine  Aged Out   Meningitis B Vaccine  Aged Out   Cologuard (Stool DNA test)  Discontinued

## 2024-03-07 NOTE — Progress Notes (Signed)
 Patient: Tonya Lawson, Female    DOB: 01/23/1961, 63 y.o.   MRN: [id number] Leavy Mole, PA-C Visit Date: 03/07/2024  Today's Provider: Mole Leavy, PA-C   Chief Complaint  Patient presents with   Annual Exam   Subjective:   Annual physical exam:  Tonya Lawson is a 63 y.o. female who presents today for complete physical exam:  Exercise/Activity: 5 days 30-60 min activity  Diet/nutrition:balanced from group home  Sleep: no concerns  SDOH Screenings   Food Insecurity: No Food Insecurity (02/01/2024)  Housing: Unknown (02/01/2024)  Transportation Needs: No Transportation Needs (02/01/2024)  Utilities: Not At Risk (02/01/2024)  Alcohol Screen: Low Risk  (02/01/2024)  Depression (PHQ2-9): Low Risk  (03/07/2024)  Financial Resource Strain: Low Risk  (02/01/2024)  Physical Activity: Sufficiently Active (02/01/2024)  Social Connections: Moderately Integrated (02/01/2024)  Stress: No Stress Concern Present (02/01/2024)  Tobacco Use: Low Risk  (03/07/2024)  Health Literacy: Inadequate Health Literacy (02/01/2024)     USPSTF grade A and B recommendations - reviewed and addressed today  Depression:  Phq 9 completed today by patient, was reviewed by me with patient in the room PHQ score is neg, cannot report sx, caregiver states some crying but overall normal behavior and mood    03/07/2024    2:34 PM 02/01/2024   12:15 PM 07/10/2023    9:40 AM 02/23/2023    9:29 AM  PHQ 2/9 Scores  PHQ - 2 Score 0 0 0 0  PHQ- 9 Score  0 0       03/07/2024    2:34 PM 02/01/2024   12:15 PM 07/10/2023    9:40 AM 02/23/2023    9:29 AM 01/08/2023    8:55 AM  Depression screen PHQ 2/9  Decreased Interest 0 0 0 0 0  Down, Depressed, Hopeless 0 0 0 0 0  PHQ - 2 Score 0 0 0 0 0  Altered sleeping  0 0  0  Tired, decreased energy  0 0  0  Change in appetite  0 0  0  Feeling bad or failure about yourself   0 0  0  Trouble concentrating  0 0  0  Moving slowly or fidgety/restless  0 0  0   Suicidal thoughts  0 0  0  PHQ-9 Score  0 0  0  Difficult doing work/chores  Not difficult at all Not difficult at all      Alcohol screening: Flowsheet Row Clinical Support from 02/01/2024 in [facility]  AUDIT-C Score 0    Immunizations and Health Maintenance: Health Maintenance  Topic Date Due   INFLUENZA VACCINE  04/11/2024   MAMMOGRAM  12/13/2024   DEXA SCAN  01/25/2025   Medicare Annual Wellness (AWV)  01/31/2025   DTaP/Tdap/Td (3 - Td or Tdap) 12/14/2027   Colonoscopy  11/23/2029   COVID-19 Vaccine  Completed   Hepatitis C Screening  Completed   HIV Screening  Completed   Zoster Vaccines- Shingrix  Completed   Pneumococcal Vaccine 31-55 Years old  Aged Out   Hepatitis B Vaccines  Aged Out   HPV VACCINES  Aged Out   Meningococcal B Vaccine  Aged Out   Fecal DNA (Cologuard)  Discontinued     Hep C Screening: done  STD testing and prevention (HIV/chl/gon/syphilis):  done in the past none indicated today  Intimate partner violence:safe at group home  Sexual History/Pain during Intercourse: Single/NA  Menstrual History/LMP/Abnormal Bleeding: n/a No LMP recorded. Patient  has had a hysterectomy.  Incontinence Symptoms: yes  Breast cancer: UTD, doing every april Last Mammogram: *see HM list above BRCA gene screening: n/a  Cervical cancer screening: n/a surgery  Osteoporosis:   Discussion on osteoporosis per age, including high calcium  and vitamin D  supplementation, weight bearing exercises Pt is supplementing with daily calcium /Vit D. Done 2024 due 2026 Bone scan/dexa  Skin cancer:  Hx of skin CA -  NO Discussed atypical lesions   Colorectal cancer:   Colonoscopy is UTD   Discussed concerning signs and sx of CRC - none known by caregiver or reported by pt  Lung cancer:   Low Dose CT Chest recommended if Age 68-80 years, 20 pack-year currently smoking OR have quit w/in 15years. Patient does not qualify.    Social History    Tobacco Use   Smoking status: Never   Smokeless tobacco: Never  Vaping Use   Vaping status: Never Used  Substance Use Topics   Alcohol use: No   Drug use: No     Flowsheet Row Clinical Support from 02/01/2024 in [facility]  AUDIT-C Score 0    Family History  Problem Relation Age of Onset   Diabetes Mother    Stroke Mother    Cancer Father        brain   Breast cancer Maternal Aunt 25   Cancer Brother      Blood pressure/Hypertension: BP Readings from Last 3 Encounters:  03/07/24 126/72  03/05/24 130/86  10/29/23 122/84    Weight/Obesity: Wt Readings from Last 3 Encounters:  03/07/24 148 lb (67.1 kg)  03/05/24 147 lb 12.8 oz (67 kg)  02/01/24 147 lb (66.7 kg)   BMI Readings from Last 3 Encounters:  03/07/24 29.89 kg/m  03/05/24 29.85 kg/m  02/01/24 29.69 kg/m     Lipids:  Lab Results  Component Value Date   CHOL 158 07/10/2023   CHOL 146 01/08/2023   CHOL 164 07/10/2022   Lab Results  Component Value Date   HDL 68 07/10/2023   HDL 67 01/08/2023   HDL 62 07/10/2022   Lab Results  Component Value Date   LDLCALC 68 07/10/2023   LDLCALC 60 01/08/2023   LDLCALC 77 07/10/2022   Lab Results  Component Value Date   TRIG 132 07/10/2023   TRIG 107 01/08/2023   TRIG 151 (H) 07/10/2022   Lab Results  Component Value Date   CHOLHDL 2.3 07/10/2023   CHOLHDL 2.2 01/08/2023   CHOLHDL 2.6 07/10/2022   No results found for: LDLDIRECT Based on the results of lipid panel his/her cardiovascular risk factor ( using Poole Cohort )  in the next 10 years is: The ASCVD Risk score (Arnett DK, et al., 2019) failed to calculate for the following reasons:   Unable to determine if patient is Non-Hispanic African American  Glucose:  Glucose, Bld  Date Value Ref Range Status  07/10/2023 82 65 - 99 mg/dL Final    Comment:    .            Fasting reference interval .   01/08/2023 84 65 - 99 mg/dL Final    Comment:    .             Fasting reference interval .   07/10/2022 87 65 - 99 mg/dL Final    Comment:    .            Fasting reference interval .     Advanced Care  Planning:  A voluntary discussion about advance care planning including the explanation and discussion of advance directives.   Pt has guardians state/group home and is DNR status   Social History       Social History   Socioeconomic History   Marital status: Single    Spouse name: Not on file   Number of children: 0   Years of education: Not on file   Highest education level: Not on file  Occupational History   Not on file  Tobacco Use   Smoking status: Never   Smokeless tobacco: Never  Vaping Use   Vaping status: Never Used  Substance and Sexual Activity   Alcohol use: No   Drug use: No   Sexual activity: Never  Other Topics Concern   Not on file  Social History Narrative   Patient is in a care home with [facility].    Social Drivers of Corporate investment banker Strain: Low Risk  (02/01/2024)   Overall Financial Resource Strain (CARDIA)    Difficulty of Paying Living Expenses: Not hard at all  Food Insecurity: No Food Insecurity (02/01/2024)   Hunger Vital Sign    Worried About Running Out of Food in the Last Year: Never true    Ran Out of Food in the Last Year: Never true  Transportation Needs: No Transportation Needs (02/01/2024)   PRAPARE - Administrator, Civil Service (Medical): No    Lack of Transportation (Non-Medical): No  Physical Activity: Sufficiently Active (02/01/2024)   Exercise Vital Sign    Days of Exercise per Week: 5 days    Minutes of Exercise per Session: 60 min  Stress: No Stress Concern Present (02/01/2024)   [employer] of Occupational Health - Occupational Stress Questionnaire    Feeling of Stress : Not at all  Social Connections: Moderately Integrated (02/01/2024)   Social Connection and Isolation Panel    Frequency of Communication with Friends and Family: More than three  times a week    Frequency of Social Gatherings with Friends and Family: More than three times a week    Attends Religious Services: 1 to 4 times per year    Active Member of [employer] or Organizations: Yes    Attends Banker Meetings: Never    Marital Status: Never married    Family History        Family History  Problem Relation Age of Onset   Diabetes Mother    Stroke Mother    Cancer Father        brain   Breast cancer Maternal Aunt 62   Cancer Brother     Patient Active Problem List   Diagnosis Date Noted   Lymphedema 08/24/2020   Swelling of limb 02/17/2020   Dyslipidemia 10/10/2018   Dental caries 10/10/2018   Overweight (BMI 25.0-29.9) 02/01/2018   DNR (do not resuscitate) 08/29/2017   Hypertension    Osteoporosis    Intellectual disability    Kyphosis of thoracic region     Past Surgical History:  Procedure Laterality Date   ABDOMINAL HYSTERECTOMY     completed   COLONOSCOPY WITH PROPOFOL  N/A 11/24/2019   Procedure: COLONOSCOPY WITH PROPOFOL ;  Surgeon: Therisa Bi, MD;  Location: [facility];  Service: Gastroenterology;  Laterality: N/A;     Current Outpatient Medications:    acetaminophen  (TYLENOL ) 500 MG tablet, Take 1 tablet (500 mg total) by mouth every 6 (six) hours as needed., Disp: 30 tablet, Rfl: 0  Calcium  Carbonate-Vitamin D  600-400 MG-UNIT tablet, Take 1 tablet by mouth 2 (two) times daily., Disp: 60 tablet, Rfl: 11   chlorhexidine (PERIDEX) 0.12 % solution, SMARTSIG:By Mouth, Disp: , Rfl:    cholecalciferol (VITAMIN D3) 25 MCG (1000 UNIT) tablet, Take 1,000 Units by mouth daily., Disp: , Rfl:    Cranberry 180 MG CAPS, Take by mouth daily., Disp: , Rfl:    CRANBERRY PLUS VITAMIN C  4200-20-3 MG-UNIT CAPS, TAKE 1 SOFTGEL BY MOUTH ONCE DAILY FOR URINARY HEALTH, Disp: 30 capsule, Rfl: 2   hydrOXYzine  (VISTARIL ) 25 MG capsule, TAKE 1 CAPSULET BY MOUTH DAILY EVERY 8 HOURS AS NEEDED FOR ANXIETY, CAN BE USEDBEFORE GOING TO DENTIST., Disp: 30  capsule, Rfl: 11   lisinopril -hydrochlorothiazide  (ZESTORETIC ) 10-12.5 MG tablet, TAKE 1/2 TABLET BY MOUTH ONCE DAILY, Disp: 15 tablet, Rfl: 2   loratadine  (CLARITIN ) 10 MG tablet, TAKE 1 TABLET BY MOUTH DAILY AS NEEDED FOR ALLERGIES, Disp: 90 tablet, Rfl: 3   Multiple Vitamins-Minerals (GNP ONE DAILY WOMENS 50+) TABS, TAKE 1 TABLET BY MOUTH ONCE DAILY FOR SUPPLEMENT, Disp: 30 tablet, Rfl: 11   pravastatin  (PRAVACHOL ) 20 MG tablet, TAKE 1 TABLET BY MOUTH EVERY DAY, Disp: 31 tablet, Rfl: 5   vitamin B-12 (CYANOCOBALAMIN ) 100 MCG tablet, TAKE 1 TABLET BY MOUTH ONCE DAILY FOR SUPPLEMENT, Disp: 30 tablet, Rfl: 11  No Known Allergies  Patient Care Team: Leavy Mole, PA-C as PCP - General (Family Medicine)   Chart Review: I personally reviewed active problem list, medication list, allergies, family history, social history, health maintenance, notes from last encounter, lab results, imaging with the patient/caregiver today.   Review of Systems  Constitutional: Negative.   HENT: Negative.    Eyes: Negative.   Respiratory: Negative.    Cardiovascular: Negative.   Gastrointestinal: Negative.   Endocrine: Negative.   Genitourinary: Negative.   Musculoskeletal: Negative.   Skin: Negative.   Allergic/Immunologic: Negative.   Neurological: Negative.   Hematological: Negative.   Psychiatric/Behavioral: Negative.    All other systems reviewed and are negative.         Objective:   Vitals:  Vitals:   03/07/24 1434  BP: 126/72  Pulse: (!) 109  Resp: 16  SpO2: 97%  Weight: 148 lb (67.1 kg)  Height: 4' 11 (1.499 m)    Body mass index is 29.89 kg/m.  Physical Exam Vitals and nursing note reviewed.  Constitutional:      General: She is not in acute distress.    Appearance: Normal appearance. She is well-developed and overweight. She is not ill-appearing, toxic-appearing or diaphoretic.  HENT:     Head: Normocephalic and atraumatic.     Right Ear: External ear normal.     Left  Ear: External ear normal.     Nose: Nose normal.   Eyes:     General: No scleral icterus.       Right eye: No discharge.        Left eye: No discharge.     Conjunctiva/sclera: Conjunctivae normal.   Neck:     Trachea: No tracheal deviation.   Cardiovascular:     Rate and Rhythm: Normal rate.  Pulmonary:     Effort: Pulmonary effort is normal. No respiratory distress.     Breath sounds: No stridor.   Musculoskeletal:     Comments: Kyphosis of thoracic spine   Skin:    General: Skin is warm and dry.     Findings: No rash.   Neurological:     Mental Status:  She is alert.     Motor: No abnormal muscle tone.     Coordination: Coordination normal.     Gait: Gait normal.   Psychiatric:        Mood and Affect: Mood normal.        Behavior: Behavior normal. Behavior is cooperative.       Fall Risk:    03/07/2024    2:33 PM 02/01/2024   12:15 PM 10/29/2023    1:26 PM 07/10/2023    9:40 AM 02/23/2023    9:26 AM  Fall Risk   Falls in the past year? 0 0 1 0 0  Number falls in past yr: 0 0 0 0 0  Injury with Fall? 0 0 1 0 0  Risk for fall due to : No Fall Risks No Fall Risks No Fall Risks No Fall Risks No Fall Risks  Follow up Falls prevention discussed Falls evaluation completed;Falls prevention discussed Falls prevention discussed Falls prevention discussed;Education provided;Falls evaluation completed Education provided;Falls prevention discussed    Functional Status Survey: Is the patient deaf or have difficulty hearing?: No Does the patient have difficulty seeing, even when wearing glasses/contacts?: No Does the patient have difficulty concentrating, remembering, or making decisions?: No Does the patient have difficulty walking or climbing stairs?: No Does the patient have difficulty dressing or bathing?: No Does the patient have difficulty doing errands alone such as visiting a doctor's office or shopping?: No   Assessment & Plan:    CPE completed today  USPSTF  grade A and B recommendations reviewed with patient; age-appropriate recommendations, preventive care, screening tests, etc discussed and encouraged; healthy living encouraged; see AVS for patient education given to patient  Discussed importance of 150 minutes of physical activity weekly, AHA exercise recommendations given to pt in AVS/handout  Discussed importance of healthy diet:  eating lean meats and proteins, avoiding trans fats and saturated fats, avoid simple sugars and excessive carbs in diet, eat 6 servings of fruit/vegetables daily and drink plenty of water and avoid sweet beverages.    Recommended pt to do annual eye exam and routine dental exams/cleanings  Depression, alcohol, fall screening completed as documented above and per flowsheets  Advance Care planning information and packet discussed and offered today, encouraged pt to discuss with family members/spouse/partner/friends and complete Advanced directive packet and bring copy to office   Reviewed Health Maintenance: Health Maintenance  Topic Date Due   INFLUENZA VACCINE  04/11/2024   MAMMOGRAM  12/13/2024   DEXA SCAN  01/25/2025   Medicare Annual Wellness (AWV)  01/31/2025   DTaP/Tdap/Td (3 - Td or Tdap) 12/14/2027   Colonoscopy  11/23/2029   COVID-19 Vaccine  Completed   Hepatitis C Screening  Completed   HIV Screening  Completed   Zoster Vaccines- Shingrix  Completed   Pneumococcal Vaccine 60-56 Years old  Aged Out   Hepatitis B Vaccines  Aged Out   HPV VACCINES  Aged Out   Meningococcal B Vaccine  Aged Out   Fecal DNA (Cologuard)  Discontinued    Immunizations: Immunization History  Administered Date(s) Administered   Influenza, Seasonal, Injecte, Preservative Fre 07/10/2023   Influenza,inj,Quad PF,6+ Mos 08/02/2015, 07/12/2016, 06/28/2017, 06/17/2018, 06/09/2019, 07/07/2020, 07/08/2021, 07/10/2022   Moderna Covid Bivalent Peds Booster(71mo Thru 84yrs) 07/19/2021   Moderna Covid-19 Fall Seasonal Vaccine 54yrs  & older 07/10/2023   Moderna Covid-19 Vaccine Bivalent Booster 63yrs & up 07/19/2021, 03/30/2022   Moderna Sars-Covid-2 Vaccination 10/15/2019, 11/13/2019, 07/19/2020, 02/23/2021   PNEUMOCOCCAL CONJUGATE-20 07/10/2022  Pneumococcal Polysaccharide-23 06/23/2000, 07/24/2005   Td 06/20/2005   Tdap 12/13/2017   Zoster Recombinant(Shingrix) 07/23/2020, 12/21/2021   Zoster, Live 09/17/2013   Vaccines:  Reviewed and UTD - next due for flu when in season    ICD-10-CM   1. Well adult exam  Z00.00 CBC with Differential/Platelet    Comprehensive metabolic panel with GFR    Lipid panel    TSH    VITAMIN D  25 Hydroxy (Vit-D Deficiency, Fractures)    CANCELED: Hemoglobin A1c    2. Intellectual disability  F79     3. Primary hypertension  I10 Comprehensive metabolic panel with GFR   BP at goal today, good med compliance with lisinopril  HCTZ, no SE or concerns    4. Dyslipidemia  E78.5 Comprehensive metabolic panel with GFR    Lipid panel   on pravastatin , tolerating, no SE or concerns, due for lipids    5. Osteoporosis without current pathological fracture, unspecified osteoporosis type  M81.0 Comprehensive metabolic panel with GFR    VITAMIN D  25 Hydroxy (Vit-D Deficiency, Fractures)   continue supplements and dexa due next year    6. Overweight (BMI 25.0-29.9)  E66.3 CBC with Differential/Platelet    Comprehensive metabolic panel with GFR    Lipid panel    TSH    CANCELED: Hemoglobin A1c   stable, overweight, stay active and healthy diet    7. Stage 3a chronic kidney disease (HCC) Chronic N18.31 CBC with Differential/Platelet    Comprehensive metabolic panel with GFR    VITAMIN D  25 Hydroxy (Vit-D Deficiency, Fractures)   recheck renal function and vit D        Return in about 6 months (around 09/06/2024) for Routine follow-up.   Michelene Cower, PA-C 03/07/24 2:58 PM  [facility]

## 2024-03-08 ENCOUNTER — Ambulatory Visit: Payer: Self-pay | Admitting: Family Medicine

## 2024-03-08 LAB — LIPID PANEL
Cholesterol: 146 mg/dL (ref <200)
HDL: 65 mg/dL (ref >=50)
LDL Cholesterol (Calc): 63 mg/dL
Non-HDL Cholesterol (Calc): 81 mg/dL (ref <130)
Total CHOL/HDL Ratio: 2.2 (calc) (ref <5.0)
Triglycerides: 94 mg/dL (ref <150)

## 2024-03-08 LAB — COMPREHENSIVE METABOLIC PANEL WITH GFR
AG Ratio: 1.7 (calc) (ref 1.0–2.5)
ALT: 13 U/L (ref 6–29)
AST: 16 U/L (ref 10–35)
Albumin: 4 g/dL (ref 3.6–5.1)
Alkaline phosphatase (APISO): 74 U/L (ref 37–153)
BUN: 15 mg/dL (ref 7–25)
CO2: 28 mmol/L (ref 20–32)
Calcium: 9.9 mg/dL (ref 8.6–10.4)
Chloride: 100 mmol/L (ref 98–110)
Creat: 0.81 mg/dL (ref 0.50–1.05)
Globulin: 2.3 g/dL (ref 1.9–3.7)
Glucose, Bld: 91 mg/dL (ref 65–99)
Potassium: 4.1 mmol/L (ref 3.5–5.3)
Sodium: 137 mmol/L (ref 135–146)
Total Bilirubin: 0.2 mg/dL (ref 0.2–1.2)
Total Protein: 6.3 g/dL (ref 6.1–8.1)
eGFR: 82 mL/min/{1.73_m2} (ref >=60)

## 2024-03-08 LAB — CBC WITH DIFFERENTIAL/PLATELET
Absolute Lymphocytes: 3315 {cells}/uL (ref 850–3900)
Absolute Monocytes: 602 {cells}/uL (ref 200–950)
Basophils Absolute: 92 {cells}/uL (ref 0–200)
Basophils Relative: 0.9 %
Eosinophils Absolute: 112 {cells}/uL (ref 15–500)
Eosinophils Relative: 1.1 %
HCT: 40 % (ref 35.0–45.0)
Hemoglobin: 12.9 g/dL (ref 11.7–15.5)
MCH: 28.7 pg (ref 27.0–33.0)
MCHC: 32.3 g/dL (ref 32.0–36.0)
MCV: 88.9 fL (ref 80.0–100.0)
MPV: 9.3 fL (ref 7.5–12.5)
Monocytes Relative: 5.9 %
Neutro Abs: 6079 {cells}/uL (ref 1500–7800)
Neutrophils Relative %: 59.6 %
Platelets: 397 10*3/uL (ref 140–400)
RBC: 4.5 10*6/uL (ref 3.80–5.10)
RDW: 13.7 % (ref 11.0–15.0)
Total Lymphocyte: 32.5 %
WBC: 10.2 10*3/uL (ref 3.8–10.8)

## 2024-03-08 LAB — TSH: TSH: 2.05 m[IU]/L (ref 0.40–4.50)

## 2024-03-08 LAB — VITAMIN D 25 HYDROXY (VIT D DEFICIENCY, FRACTURES): Vit D, 25-Hydroxy: 40 ng/mL (ref 30–100)

## 2024-03-21 ENCOUNTER — Telehealth: Payer: Self-pay

## 2024-03-21 NOTE — Telephone Encounter (Signed)
 Faxed signed paper work

## 2024-03-21 NOTE — Telephone Encounter (Signed)
 Copied from CRM [id number]. Topic: Medical Record Request - Other >> Mar 21, 2024  9:42 AM Silvana PARAS wrote: Reason for CRM: Nichole from [facility] [employer] at [phone] calling to verify if FL2 forms were received on 7/3 via fax.

## 2024-03-31 ENCOUNTER — Ambulatory Visit: Payer: Self-pay | Admitting: Family Medicine

## 2024-04-04 ENCOUNTER — Ambulatory Visit (INDEPENDENT_AMBULATORY_CARE_PROVIDER_SITE_OTHER): Admitting: Nurse Practitioner

## 2024-05-01 ENCOUNTER — Ambulatory Visit: Payer: Self-pay

## 2024-05-01 NOTE — Telephone Encounter (Signed)
 Patient called, left VM to return the call to the office to speak to NT.       Copied from CRM [id number]. Topic: Clinical - Medical Advice >> May 01, 2024  1:42 PM Berwyn MATSU wrote: Reason for CRM: patient called in requesting something for a Hemorid per patient no bleeding and no pain.

## 2024-05-01 NOTE — Telephone Encounter (Signed)
 NT spoke with nurse at [facility] and she states that pt had small amount of bleeding on yesterday on the tissue and staff examined her and noticed a small external hemorrhoid.  States that they are requesting medication to help treat hemorrhoid. Denies any other bleeding since.

## 2024-05-01 NOTE — Telephone Encounter (Signed)
 FYI Only or Action Required?: Action required by provider: update on patient condition.  Patient was last seen in primary care on 07/08/2021 by Gareth Mliss FALCON, FNP.  Called Nurse Triage reporting Anal Itching.  Symptoms began yesterday.  Interventions attempted: Nothing.  Symptoms are: gradually improving.  Triage Disposition: See PCP When Office is Open (Within 3 Days)  Patient/caregiver understands and will follow disposition?: No, wishes to speak with PCP    Reason for Disposition  MILD rectal bleeding (e.g., more than just a few drops or streaks)  Answer Assessment - Initial Assessment Questions 1. APPEARANCE of BLOOD: What color is it? Is it passed separately, on the surface of the stool, or mixed in with the stool?      On tissue per nurse 2. AMOUNT: How much blood was passed?      Small about 3. FREQUENCY: How many times has blood been passed with the stools?      1 time 4. ONSET: When was the blood first seen in the stools? (Days or weeks)      yesterday 5. DIARRHEA: Is there also some diarrhea? If Yes, ask: How many diarrhea stools in the past 24 hours?      no 6. CONSTIPATION: Do you have constipation? If Yes, ask: How bad is it?     no 7. RECURRENT SYMPTOMS: Have you had blood in your stools before? If Yes, ask: When was the last time? and What happened that time?      no 8. BLOOD THINNERS: Do you take any blood thinners? (e.g., aspirin , clopidogrel / Plavix, coumadin, heparin). Notes: Other strong blood thinners include: Arixtra (fondaparinux), Eliquis (apixaban), Pradaxa (dabigatran), and Xarelto (rivaroxaban).     no 9. OTHER SYMPTOMS: Do you have any other symptoms?  (e.g., abdomen pain, vomiting, dizziness, fever)     no  Protocols used: Rectal Bleeding-A-AH

## 2024-05-02 ENCOUNTER — Ambulatory Visit: Payer: Self-pay

## 2024-05-02 NOTE — Telephone Encounter (Signed)
 Called and notified

## 2024-05-02 NOTE — Telephone Encounter (Signed)
 Spoke with pharmacist clarifying no prescription is sent in until an OV. Advised OTC medication

## 2024-05-02 NOTE — Telephone Encounter (Signed)
 Please see note below. This RN transferred pharmacist to CAL.   Reason for Disposition  [1] Caller requesting NON-URGENT health information AND [2] PCP's office is the best resource  Answer Assessment - Initial Assessment Questions 1. REASON FOR CALL: What is the main reason for your call? or How can I best help you?     Pharmacy calling in seeking clarification on medication request. Per NT encounter from yesterday, provider advised in-person evaluation in order to obtain prescription medication. Provider advised OTC medication in the meantime. Per pharmacist, a nurse from patient's facility emailed him requesting prescription medication. This RN transferred pharmacist to CAL in order to maintain closed loop communication.  Protocols used: Information Only Call - No Triage-A-AH

## 2024-05-27 ENCOUNTER — Other Ambulatory Visit: Payer: Self-pay | Admitting: Family Medicine

## 2024-05-27 DIAGNOSIS — I10 Essential (primary) hypertension: Secondary | ICD-10-CM

## 2024-05-28 NOTE — Telephone Encounter (Signed)
 Per last visit- Rx refilled per provider- will refill Requested Prescriptions  Pending Prescriptions Disp Refills   CRANBERRY PLUS VITAMIN C  4200-20-3 MG-UNIT CAPS [Pharmacy Med Name: CRANBERRY PLUS VITAMIN C  4200-20-3] 30 capsule 2    Sig: TAKE 1 SOFTGEL BY MOUTH ONCE DAILY FOR URINARY HEALTH     There is no refill protocol information for this order     lisinopril -hydrochlorothiazide  (ZESTORETIC ) 10-12.5 MG tablet [Pharmacy Med Name: LISINOPRIL -HCTZ 10-12.5 MG TAB] 15 tablet 2    Sig: TAKE 1/2 TABLET BY MOUTH ONCE DAILY     Cardiovascular:  ACEI + Diuretic Combos Passed - 05/28/2024  2:45 PM      Passed - Na in normal range and within 180 days    Sodium  Date Value Ref Range Status  03/07/2024 137 135 - 146 mmol/L Final  10/09/2016 143 134 - 144 mmol/L Final         Passed - K in normal range and within 180 days    Potassium  Date Value Ref Range Status  03/07/2024 4.1 3.5 - 5.3 mmol/L Final         Passed - Cr in normal range and within 180 days    Creat  Date Value Ref Range Status  03/07/2024 0.81 0.50 - 1.05 mg/dL Final         Passed - eGFR is 30 or above and within 180 days    GFR, Est African American  Date Value Ref Range Status  01/06/2021 82 > OR = 60 mL/min/1.25m2 Final   GFR, Est Non African American  Date Value Ref Range Status  01/06/2021 71 > OR = 60 mL/min/1.67m2 Final   eGFR  Date Value Ref Range Status  03/07/2024 82 > OR = 60 mL/min/1.77m2 Final         Passed - Patient is not pregnant      Passed - Last BP in normal range    BP Readings from Last 1 Encounters:  03/07/24 126/72         Passed - Valid encounter within last 6 months    Recent Outpatient Visits           2 months ago Well adult exam   [facility] Leavy Mole, PA-C   7 months ago Contusion of face, initial encounter   [facility] Leavy Mole, PA-C       Future Appointments             In 3 months Leavy Mole,  PA-C [facility], [place]   In 9 months Leavy Mole, PA-C [facility], [place]

## 2024-06-25 ENCOUNTER — Other Ambulatory Visit: Payer: Self-pay | Admitting: Family Medicine

## 2024-06-27 NOTE — Telephone Encounter (Signed)
 Requested Prescriptions  Pending Prescriptions Disp Refills   Multiple Vitamins-Minerals (GNP ONE DAILY WOMENS 50+) TABS [Pharmacy Med Name: GNP ONE DAILY WOMENS 50+ TAB] 90 tablet 0    Sig: TAKE 1 TABLET BY MOUTH ONCE DAILY FOR SUPPLEMENT     There is no refill protocol information for this order     vitamin B-12 (CYANOCOBALAMIN ) 100 MCG tablet [Pharmacy Med Name: VITAMIN B-12 100 MCG TAB] 90 tablet 0    Sig: TAKE 1 TABLET BY MOUTH ONCE DAILY FOR SUPPLEMENT     Endocrinology:  Vitamins - Vitamin B12 Failed - 06/27/2024 12:04 PM      Failed - B12 Level in normal range and within 360 days    Vitamin B-12  Date Value Ref Range Status  07/10/2023 1,485 (H) 200 - 1,100 pg/mL Final         Passed - HCT in normal range and within 360 days    HCT  Date Value Ref Range Status  03/07/2024 40.0 35.0 - 45.0 % Final   Hematocrit  Date Value Ref Range Status  10/09/2016 42.5 34.0 - 46.6 % Final         Passed - HGB in normal range and within 360 days    Hemoglobin  Date Value Ref Range Status  03/07/2024 12.9 11.7 - 15.5 g/dL Final  98/70/7981 85.8 11.1 - 15.9 g/dL Final         Passed - Valid encounter within last 12 months    Recent Outpatient Visits           3 months ago Well adult exam   [facility] Leavy Mole, PA-C   8 months ago Contusion of face, initial encounter   [facility] Leavy Mole, PA-C       Future Appointments             In 2 months Leavy Mole, PA-C [facility], [place]   In 8 months Leavy Mole, PA-C [facility], [place]

## 2024-07-22 ENCOUNTER — Other Ambulatory Visit: Payer: Self-pay | Admitting: Family Medicine

## 2024-07-22 DIAGNOSIS — E785 Hyperlipidemia, unspecified: Secondary | ICD-10-CM

## 2024-07-24 NOTE — Telephone Encounter (Signed)
 Requested Prescriptions  Pending Prescriptions Disp Refills   pravastatin  (PRAVACHOL ) 20 MG tablet [Pharmacy Med Name: PRAVASTATIN  SODIUM 20 MG TAB] 31 tablet 5    Sig: TAKE 1 TABLET BY MOUTH EVERY DAY     Cardiovascular:  Antilipid - Statins Failed - 07/24/2024  2:50 PM      Failed - Lipid Panel in normal range within the last 12 months    Cholesterol, Total  Date Value Ref Range Status  10/09/2016 226 (H) 100 - 199 mg/dL Final   Cholesterol  Date Value Ref Range Status  03/07/2024 146 <200 mg/dL Final   LDL Cholesterol (Calc)  Date Value Ref Range Status  03/07/2024 63 mg/dL (calc) Final    Comment:    Reference range: <100 . Desirable range <100 mg/dL for primary prevention;   <70 mg/dL for patients with CHD or diabetic patients  with > or = 2 CHD risk factors. SABRA LDL-C is now calculated using the Martin-Hopkins  calculation, which is a validated novel method providing  better accuracy than the Friedewald equation in the  estimation of LDL-C.  Gladis APPLETHWAITE et al. SANDREA. 7986;689(80): 2061-2068  (http://education.[employer].com/faq/FAQ164)    HDL  Date Value Ref Range Status  03/07/2024 65 > OR = 50 mg/dL Final  98/70/7981 49 >60 mg/dL Final   Triglycerides  Date Value Ref Range Status  03/07/2024 94 <150 mg/dL Final         Passed - Patient is not pregnant      Passed - Valid encounter within last 12 months    Recent Outpatient Visits           4 months ago Well adult exam   [facility] Leavy Mole, PA-C   8 months ago Contusion of face, initial encounter   [facility] Leavy Mole, PA-C       Future Appointments             In 1 month Leavy Mole, PA-C [facility], [place]   In 7 months Leavy Mole, PA-C [facility], [place]

## 2024-09-08 ENCOUNTER — Ambulatory Visit: Admitting: Family Medicine

## 2025-02-20 ENCOUNTER — Ambulatory Visit

## 2025-03-09 ENCOUNTER — Encounter: Admitting: Family Medicine
